# Patient Record
Sex: Female | Born: 1955 | Race: White | Hispanic: Yes | Marital: Married | Smoking: Never smoker
Health system: Southern US, Community
[De-identification: ages and names within clinical notes are randomized; demographics above are authoritative.]

## PROBLEM LIST (undated history)

## (undated) DIAGNOSIS — C221 Intrahepatic bile duct carcinoma: Secondary | ICD-10-CM

## (undated) DIAGNOSIS — M1990 Unspecified osteoarthritis, unspecified site: Secondary | ICD-10-CM

## (undated) DIAGNOSIS — I10 Essential (primary) hypertension: Secondary | ICD-10-CM

## (undated) DIAGNOSIS — C7889 Secondary malignant neoplasm of other digestive organs: Secondary | ICD-10-CM

## (undated) DIAGNOSIS — K635 Polyp of colon: Secondary | ICD-10-CM

## (undated) DIAGNOSIS — D490 Neoplasm of unspecified behavior of digestive system: Secondary | ICD-10-CM

## (undated) HISTORY — DX: Unspecified osteoarthritis, unspecified site: M19.90

## (undated) HISTORY — DX: Polyp of colon: K63.5

---

## 1999-01-27 ENCOUNTER — Encounter: Admission: RE | Admit: 1999-01-27 | Discharge: 1999-01-27 | Payer: Self-pay | Admitting: Obstetrics & Gynecology

## 1999-02-12 ENCOUNTER — Ambulatory Visit (HOSPITAL_COMMUNITY): Admission: RE | Admit: 1999-02-12 | Discharge: 1999-02-12 | Payer: Self-pay | Admitting: Obstetrics & Gynecology

## 1999-02-12 ENCOUNTER — Encounter: Payer: Self-pay | Admitting: Obstetrics & Gynecology

## 2000-07-11 ENCOUNTER — Encounter: Admission: RE | Admit: 2000-07-11 | Discharge: 2000-07-11 | Payer: Self-pay | Admitting: Family Medicine

## 2000-07-22 ENCOUNTER — Emergency Department (HOSPITAL_COMMUNITY): Admission: EM | Admit: 2000-07-22 | Discharge: 2000-07-22 | Payer: Self-pay | Admitting: Emergency Medicine

## 2000-07-22 ENCOUNTER — Encounter: Payer: Self-pay | Admitting: Emergency Medicine

## 2000-08-23 ENCOUNTER — Encounter: Admission: RE | Admit: 2000-08-23 | Discharge: 2000-08-23 | Payer: Self-pay | Admitting: Family Medicine

## 2000-08-23 ENCOUNTER — Encounter: Payer: Self-pay | Admitting: Family Medicine

## 2000-09-01 ENCOUNTER — Encounter: Admission: RE | Admit: 2000-09-01 | Discharge: 2000-10-05 | Payer: Self-pay | Admitting: Occupational Medicine

## 2001-04-03 ENCOUNTER — Encounter: Admission: RE | Admit: 2001-04-03 | Discharge: 2001-04-03 | Payer: Self-pay | Admitting: Family Medicine

## 2001-04-03 ENCOUNTER — Encounter: Payer: Self-pay | Admitting: Family Medicine

## 2001-09-22 ENCOUNTER — Encounter: Admission: RE | Admit: 2001-09-22 | Discharge: 2001-09-22 | Payer: Self-pay | Admitting: Family Medicine

## 2001-09-22 ENCOUNTER — Encounter: Payer: Self-pay | Admitting: Family Medicine

## 2002-09-28 ENCOUNTER — Encounter: Admission: RE | Admit: 2002-09-28 | Discharge: 2002-09-28 | Payer: Self-pay | Admitting: Family Medicine

## 2002-09-28 ENCOUNTER — Encounter: Payer: Self-pay | Admitting: Family Medicine

## 2002-10-05 ENCOUNTER — Encounter: Admission: RE | Admit: 2002-10-05 | Discharge: 2002-10-05 | Payer: Self-pay | Admitting: Family Medicine

## 2003-07-17 ENCOUNTER — Encounter: Admission: RE | Admit: 2003-07-17 | Discharge: 2003-07-17 | Payer: Self-pay | Admitting: Family Medicine

## 2004-04-30 ENCOUNTER — Encounter: Admission: RE | Admit: 2004-04-30 | Discharge: 2004-04-30 | Payer: Self-pay | Admitting: Family Medicine

## 2004-05-04 ENCOUNTER — Encounter: Admission: RE | Admit: 2004-05-04 | Discharge: 2004-05-04 | Payer: Self-pay | Admitting: Sports Medicine

## 2004-07-29 ENCOUNTER — Encounter (INDEPENDENT_AMBULATORY_CARE_PROVIDER_SITE_OTHER): Payer: Self-pay | Admitting: Family Medicine

## 2004-07-29 ENCOUNTER — Ambulatory Visit: Payer: Self-pay | Admitting: Family Medicine

## 2004-08-06 ENCOUNTER — Encounter: Admission: RE | Admit: 2004-08-06 | Discharge: 2004-08-06 | Payer: Self-pay | Admitting: Sports Medicine

## 2005-02-10 ENCOUNTER — Ambulatory Visit: Payer: Self-pay | Admitting: Family Medicine

## 2005-03-01 ENCOUNTER — Ambulatory Visit: Payer: Self-pay | Admitting: Sports Medicine

## 2005-08-10 ENCOUNTER — Encounter: Admission: RE | Admit: 2005-08-10 | Discharge: 2005-08-10 | Payer: Self-pay | Admitting: Sports Medicine

## 2006-02-16 ENCOUNTER — Ambulatory Visit: Payer: Self-pay | Admitting: Family Medicine

## 2006-02-16 ENCOUNTER — Encounter (INDEPENDENT_AMBULATORY_CARE_PROVIDER_SITE_OTHER): Payer: Self-pay | Admitting: Family Medicine

## 2006-02-16 ENCOUNTER — Other Ambulatory Visit: Admission: RE | Admit: 2006-02-16 | Discharge: 2006-02-16 | Payer: Self-pay | Admitting: Family Medicine

## 2006-02-18 ENCOUNTER — Encounter: Admission: RE | Admit: 2006-02-18 | Discharge: 2006-02-18 | Payer: Self-pay | Admitting: Sports Medicine

## 2006-02-26 ENCOUNTER — Encounter (INDEPENDENT_AMBULATORY_CARE_PROVIDER_SITE_OTHER): Payer: Self-pay | Admitting: *Deleted

## 2006-02-26 LAB — CONVERTED CEMR LAB

## 2006-04-20 ENCOUNTER — Emergency Department (HOSPITAL_COMMUNITY): Admission: EM | Admit: 2006-04-20 | Discharge: 2006-04-20 | Payer: Self-pay | Admitting: Emergency Medicine

## 2006-08-17 ENCOUNTER — Ambulatory Visit: Payer: Self-pay | Admitting: Family Medicine

## 2006-08-23 ENCOUNTER — Encounter: Admission: RE | Admit: 2006-08-23 | Discharge: 2006-08-23 | Payer: Self-pay | Admitting: Sports Medicine

## 2006-08-30 ENCOUNTER — Encounter: Admission: RE | Admit: 2006-08-30 | Discharge: 2006-08-30 | Payer: Self-pay | Admitting: Sports Medicine

## 2006-10-24 ENCOUNTER — Ambulatory Visit: Payer: Self-pay | Admitting: Sports Medicine

## 2006-11-16 ENCOUNTER — Ambulatory Visit: Payer: Self-pay | Admitting: Obstetrics and Gynecology

## 2006-11-17 ENCOUNTER — Ambulatory Visit (HOSPITAL_COMMUNITY): Admission: RE | Admit: 2006-11-17 | Discharge: 2006-11-17 | Payer: Self-pay | Admitting: Sports Medicine

## 2006-12-20 ENCOUNTER — Ambulatory Visit: Admission: RE | Admit: 2006-12-20 | Discharge: 2006-12-20 | Payer: Self-pay | Admitting: Gynecologic Oncology

## 2007-01-19 DIAGNOSIS — M1990 Unspecified osteoarthritis, unspecified site: Secondary | ICD-10-CM | POA: Insufficient documentation

## 2007-01-19 DIAGNOSIS — M7710 Lateral epicondylitis, unspecified elbow: Secondary | ICD-10-CM | POA: Insufficient documentation

## 2007-01-20 ENCOUNTER — Encounter (INDEPENDENT_AMBULATORY_CARE_PROVIDER_SITE_OTHER): Payer: Self-pay | Admitting: *Deleted

## 2007-01-24 ENCOUNTER — Inpatient Hospital Stay (HOSPITAL_COMMUNITY): Admission: RE | Admit: 2007-01-24 | Discharge: 2007-01-26 | Payer: Self-pay | Admitting: Obstetrics & Gynecology

## 2007-01-24 ENCOUNTER — Encounter (INDEPENDENT_AMBULATORY_CARE_PROVIDER_SITE_OTHER): Payer: Self-pay | Admitting: Specialist

## 2007-01-24 HISTORY — PX: ABDOMINAL HYSTERECTOMY: SHX81

## 2007-03-08 ENCOUNTER — Ambulatory Visit: Admission: RE | Admit: 2007-03-08 | Discharge: 2007-03-08 | Payer: Self-pay | Admitting: Gynecologic Oncology

## 2007-03-08 ENCOUNTER — Encounter (INDEPENDENT_AMBULATORY_CARE_PROVIDER_SITE_OTHER): Payer: Self-pay | Admitting: Family Medicine

## 2007-09-13 ENCOUNTER — Encounter: Admission: RE | Admit: 2007-09-13 | Discharge: 2007-09-13 | Payer: Self-pay | Admitting: Sports Medicine

## 2007-09-26 ENCOUNTER — Ambulatory Visit: Payer: Self-pay | Admitting: Family Medicine

## 2007-09-26 DIAGNOSIS — R030 Elevated blood-pressure reading, without diagnosis of hypertension: Secondary | ICD-10-CM | POA: Insufficient documentation

## 2007-09-28 ENCOUNTER — Ambulatory Visit: Payer: Self-pay | Admitting: Family Medicine

## 2007-09-28 ENCOUNTER — Encounter: Payer: Self-pay | Admitting: Family Medicine

## 2007-09-28 LAB — CONVERTED CEMR LAB
ALT: 10 units/L (ref 0–35)
Albumin: 4.6 g/dL (ref 3.5–5.2)
Alkaline Phosphatase: 72 units/L (ref 39–117)
CO2: 27 meq/L (ref 19–32)
Cholesterol: 233 mg/dL — ABNORMAL HIGH (ref 0–200)
Glucose, Bld: 95 mg/dL (ref 70–99)
LDL Cholesterol: 163 mg/dL — ABNORMAL HIGH (ref 0–99)
Platelets: 219 10*3/uL (ref 150–400)
Potassium: 4.4 meq/L (ref 3.5–5.3)
RBC: 4.39 M/uL (ref 3.87–5.11)
Sodium: 141 meq/L (ref 135–145)
Total Protein: 7.4 g/dL (ref 6.0–8.3)
Triglycerides: 99 mg/dL (ref <150)
WBC: 4.7 10*3/uL (ref 4.0–10.5)

## 2007-10-12 ENCOUNTER — Encounter: Payer: Self-pay | Admitting: Family Medicine

## 2007-12-08 ENCOUNTER — Ambulatory Visit: Payer: Self-pay | Admitting: Family Medicine

## 2007-12-08 DIAGNOSIS — I10 Essential (primary) hypertension: Secondary | ICD-10-CM

## 2008-08-27 ENCOUNTER — Ambulatory Visit: Payer: Self-pay | Admitting: Family Medicine

## 2008-08-27 ENCOUNTER — Encounter (INDEPENDENT_AMBULATORY_CARE_PROVIDER_SITE_OTHER): Payer: Self-pay | Admitting: Family Medicine

## 2008-08-27 DIAGNOSIS — E7800 Pure hypercholesterolemia, unspecified: Secondary | ICD-10-CM

## 2008-08-28 ENCOUNTER — Encounter (INDEPENDENT_AMBULATORY_CARE_PROVIDER_SITE_OTHER): Payer: Self-pay | Admitting: Family Medicine

## 2008-08-28 LAB — CONVERTED CEMR LAB
AST: 15 units/L (ref 0–37)
Albumin: 4.8 g/dL (ref 3.5–5.2)
Alkaline Phosphatase: 76 units/L (ref 39–117)
BUN: 13 mg/dL (ref 6–23)
Calcium: 9.6 mg/dL (ref 8.4–10.5)
Chloride: 103 meq/L (ref 96–112)
Potassium: 3.7 meq/L (ref 3.5–5.3)
Sodium: 141 meq/L (ref 135–145)
Total Protein: 7.5 g/dL (ref 6.0–8.3)

## 2008-09-13 ENCOUNTER — Encounter: Admission: RE | Admit: 2008-09-13 | Discharge: 2008-09-13 | Payer: Self-pay | Admitting: Family Medicine

## 2008-10-25 ENCOUNTER — Encounter (INDEPENDENT_AMBULATORY_CARE_PROVIDER_SITE_OTHER): Payer: Self-pay | Admitting: Family Medicine

## 2008-10-25 ENCOUNTER — Encounter: Payer: Self-pay | Admitting: Family Medicine

## 2008-10-29 ENCOUNTER — Encounter: Payer: Self-pay | Admitting: Family Medicine

## 2008-11-14 DIAGNOSIS — K635 Polyp of colon: Secondary | ICD-10-CM

## 2008-11-14 HISTORY — DX: Polyp of colon: K63.5

## 2009-09-19 ENCOUNTER — Encounter: Admission: RE | Admit: 2009-09-19 | Discharge: 2009-09-19 | Payer: Self-pay | Admitting: *Deleted

## 2010-06-24 ENCOUNTER — Ambulatory Visit: Payer: Self-pay | Admitting: Family Medicine

## 2010-06-24 ENCOUNTER — Encounter: Payer: Self-pay | Admitting: Family Medicine

## 2010-06-24 DIAGNOSIS — I8390 Asymptomatic varicose veins of unspecified lower extremity: Secondary | ICD-10-CM

## 2010-06-26 LAB — CONVERTED CEMR LAB
AST: 13 units/L (ref 0–37)
Albumin: 4.7 g/dL (ref 3.5–5.2)
Alkaline Phosphatase: 59 units/L (ref 39–117)
BUN: 11 mg/dL (ref 6–23)
Creatinine, Ser: 0.54 mg/dL (ref 0.40–1.20)
Glucose, Bld: 99 mg/dL (ref 70–99)
HDL: 49 mg/dL (ref >39)
Hepatitis B-Post: 0 milliintl units/mL
LDL Cholesterol: 133 mg/dL — ABNORMAL HIGH (ref 0–99)
Potassium: 3.7 meq/L (ref 3.5–5.3)
Total Bilirubin: 0.5 mg/dL (ref 0.3–1.2)
Total CHOL/HDL Ratio: 4.1
Triglycerides: 107 mg/dL (ref <150)
VLDL: 21 mg/dL (ref 0–40)

## 2010-07-17 ENCOUNTER — Telehealth: Payer: Self-pay | Admitting: Family Medicine

## 2010-07-20 ENCOUNTER — Encounter: Payer: Self-pay | Admitting: Family Medicine

## 2010-08-11 ENCOUNTER — Ambulatory Visit: Payer: Self-pay | Admitting: Family Medicine

## 2010-09-07 ENCOUNTER — Ambulatory Visit: Payer: Self-pay | Admitting: Vascular Surgery

## 2010-09-10 ENCOUNTER — Ambulatory Visit: Payer: Self-pay | Admitting: Family Medicine

## 2010-09-17 ENCOUNTER — Ambulatory Visit: Payer: Self-pay | Admitting: Vascular Surgery

## 2010-09-25 ENCOUNTER — Encounter: Admission: RE | Admit: 2010-09-25 | Discharge: 2010-09-25 | Payer: Self-pay | Admitting: Family Medicine

## 2010-12-13 ENCOUNTER — Encounter: Payer: Self-pay | Admitting: Obstetrics and Gynecology

## 2010-12-13 ENCOUNTER — Encounter: Payer: Self-pay | Admitting: *Deleted

## 2010-12-22 NOTE — Assessment & Plan Note (Signed)
Summary: Tammy [name]   Vital Signs:  Patient profile:   55 year old female Height:      60 inches Weight:      125.8 pounds BMI:     24.66 Temp:     98.7 degrees F Pulse rate:   96 / minute BP sitting:   150 / 87  Vitals Entered By: Golden Circle RN (August 11, 2010 8:32 AM)  Primary Care Aireana Ryland:  Ellery Plunk MD  CC:  f/u labs, HTN, and vaccines.  History of Present Illness: HTN- [facility] always 120s/70s feels nervous at the MD and thinks pressure goes up.   eating well. lost some weight.  walking daily.  vaccines- here today to start hep B and hep A series.    Habits & Providers  Alcohol-Tobacco-Diet     Alcohol drinks/day: 0     Tobacco Status: never  Exercise-Depression-Behavior     Seat Belt Use: always  Current Medications (verified): 1)  None  Allergies (verified): No Known Drug Allergies  Review of Systems  The patient denies anorexia, fever, vision loss, and chest pain.    Physical Exam  General:  VS reviewed.  BP rechecked and was 142/90.  well-developed, well-nourished, appropriate dress, and normal appearance.   Lungs:  Normal respiratory effort, chest expands symmetrically. Lungs are clear to auscultation, no crackles or wheezes. Heart:  Normal rate and regular rhythm. S1 and S2 normal without gallop, murmur, click, rub or other extra sounds. Extremities:  no edema   Impression & Recommendations:  Problem # 1:  ELEVATED BLOOD PRESSURE (ICD-796.2) Assessment Unchanged bp continues to be elevated.  repeat bp borderline HTN.  continue to monitor, [facility]   no meds.  Problem # 2:  HEPATITIS B IMMUNITY (ICD-V05.8) Assessment: Improved start vaccination series today. Orders: [facility]- Est Level  3 (76160)  Other Orders: Hepatitis A Vaccine (Adult Dose) (73710) Admin 1st Vaccine (62694) Hepatitis B Vaccine >27yrs (85462) Admin of Any Addtl Vaccine (70350)  Patient Instructions: 1)   Fue agradable ver que hoy en da. 2)   por favor haga una cita de enfermera en un mes para la vacuna. Despus de eso, ser de 6 meses ms hasta la prxima vacuna. 3)   Por favor, vuelva a verme en 6 meses o antes si es necesario 4)  nurse clinic 1 month for vaccine   Immunizations Administered:  Hepatitis A Vaccine # 1:    Vaccine Type: HepA    Site: left deltoid    Mfr: GlaxoSmithKline    Dose: 0.1 ml    Route: IM    Given by: Jone Baseman CMA    Exp. Date: 07/17/2012    Lot #: [id number]    VIS given: 02/09/05 version given August 11, 2010.  Hepatitis B Vaccine # 1:    Vaccine Type: HepB Adult    Site: right deltoid    Mfr: GlaxoSmithKline    Dose: 1.0 ml    Route: IM    Given by: Jone Baseman CMA    Exp. Date: 08/14/2012    Lot #: [id number]    VIS given: 06/08/06 version given August 11, 2010.

## 2010-12-22 NOTE — Letter (Signed)
Summary: [facility]  [facility]   Imported By: Clydell Hakim 06/25/2010 12:02:55  _____________________________________________________________________  External Attachment:    Type:   Image     Comment:   External Document

## 2010-12-22 NOTE — Letter (Signed)
Summary: Generic Letter  [facility]  [address]   [place], [place] [address]   Phone: [phone]  Fax: [phone]    07/20/2010  [facility] [address] [place], [place]  [address]  [place]. Cephus Richer, Estoy escribiendo con los [place] de sus anlisis de [place]. Su nivel de colesterol era slo ligeramente elevada. No creo que necesita un medicamento de Proofreader. Seguir trabajando en su dieta y ejercicio.  Su anlisis de [employer] no estn vacunados contra la hepatitis. Cuando vuelva a la clnica, que comenzar a las vacunas.  Por favor, haga una cita para volver pronto para volver a comprobar su presin arterial y obtener sus vacunas.  Atentamente,    Ellery Plunk MD  Appended Document: Generic Letter mailed

## 2010-12-22 NOTE — Consult Note (Signed)
Summary: [facility]  [facility]   Imported By: Bradly Bienenstock 06/24/2010 13:07:59  _____________________________________________________________________  External Attachment:    Type:   Image     Comment:   External Document

## 2010-12-22 NOTE — Progress Notes (Signed)
Summary: phn msg  Phone Note Call from Patient Call back at [phone]   Caller: Patient Summary of Call: pls call back with interpreter- spanish Initial call taken by: De Nurse,  July 17, 2010 8:54 AM  Follow-up for Phone Call        message given to Marines to call patient with information about [facility] appointment. Follow-up by: Theresia Lo RN,  July 20, 2010 8:33 AM  Additional Follow-up for Phone Call Additional follow up Details #1::        Marines gave message to patient about appointment with [facility] . now patient states she is waiting on a letter from MD about lab work . will send  message to MD to please advise. Additional Follow-up by: Theresia Lo RN,  July 20, 2010 8:38 AM    Additional Follow-up for Phone Call Additional follow up Details #2::    pt was to return to clinic in one month to get results.  She stated that she wanted them at the appt and not in a letter.  I will send a letter now but she should also make a follow up appt. Follow-up by: Ellery Plunk MD,  July 20, 2010 10:13 AM  letter mailed and will ask Marines to call patient to schedule appointment. Theresia Lo RN  July 20, 2010 11:32 AM  Appended Document: phn msg appointment scheduled for 08/11/2010 with Dr.Spiegel.

## 2010-12-22 NOTE — Assessment & Plan Note (Signed)
Summary: 2ND HEP SHOT/FLU SHOT/KH  Nurse Visit   Vital Signs:  Patient profile:   55 year old female Temp:     98.5 degrees F  Vitals Entered By: Theresia Lo RN (September 10, 2010 3:57 PM)  Allergies: No Known Drug Allergies  Immunizations Administered:  Influenza Vaccine # 1:    Vaccine Type: Fluvax 3+    Site: left deltoid    Mfr: GlaxoSmithKline    Dose: 0.5 ml    Route: IM    Given by: Theresia Lo RN    Exp. Date: 05/19/2011    Lot #: [id number]    VIS given: 06/16/10 version given September 10, 2010.  Hepatitis B Vaccine # 2:    Vaccine Type: HepB Adult    Site: right deltoid    Mfr: GlaxoSmithKline    Dose: 1.0 ml    Route: IM    Given by: Theresia Lo RN    Exp. Date: 08/14/2012    Lot #: [id number]    VIS given: 06/08/06 version given September 10, 2010.  Flu Vaccine Consent Questions:    Do you have a history of severe allergic reactions to this vaccine? no    Any prior history of allergic reactions to egg and/or gelatin? no    Do you have a sensitivity to the preservative Thimersol? no    Do you have a past history of Guillan-Barre Syndrome? no    Do you currently have an acute febrile illness? no    Have you ever had a severe reaction to latex? no    Vaccine information given and explained to patient? yes    Are you currently pregnant? no  Orders Added: 1)  Flu Vaccine 98yrs + [90658] 2)  Admin 1st Vaccine [90471] 3)  Hepatitis B Vaccine >43yrs [90746] 4)  Admin of Any Addtl Vaccine [90472]   Vital Signs:  Patient profile:   55 year old female Temp:     98.5 degrees F  Vitals Entered By: Theresia Lo RN (September 10, 2010 3:57 PM)

## 2010-12-22 NOTE — Assessment & Plan Note (Signed)
Summary: cpe/kh  [facility]  Vital Signs:  Patient profile:   55 year old female Height:      59.75 inches Weight:      129.4 pounds BMI:     25.58 Temp:     98.6 degrees F Pulse rate:   82 / minute BP sitting:   140 / 99  Vitals Entered By: Golden Circle RN (June 24, 2010 8:35 AM)  Primary Care Renan Danese:  Ellery Plunk MD  CC:  CPE.  History of Present Illness: reviewed preventative health care: mammo due 10/11, colonoscopy due 1-3 years  HTN- [facility], usually 120s/70s.  nervous at doctor's office  varicose veins- pt complaining of enlarged veins on legs for greater than a year, thinks they are due to her standing a lot at work.  does not hurt but wants referral to vein specialist for appearance.  HL- walking a lot, changed diet, less fat, lost weight,   ? vaccinated against Hep-immigration papers filed 4-5 years ago.  unclear if had Hep A or B. would like to be vacc if necessary.  Has friend at work with recent infection  Habits & Providers  Alcohol-Tobacco-Diet     Alcohol drinks/day: 0     Tobacco Status: never  Exercise-Depression-Behavior     Does Patient Exercise: yes     Exercise Counseling: not indicated; exercise is adequate     Type of exercise: walking     Drug Use: never     Seat Belt Use: always  Current Medications (verified): 1)  None  Allergies (verified): No Known Drug Allergies  Social History: Lives with husband, Jacqlyn Larsen and daughter, Sue Lush, in [place]. No tob, no EtOH, no drugs. Works in [employer] center at [employer] and [place]. 13 years here. Walks 45 min almost every day. improved her diet to have less fat.Does Patient Exercise:  yes Drug Use:  never Seat Belt Use:  always  Review of Systems  The patient denies anorexia, fever, chest pain, syncope, and abdominal pain.    Physical Exam  General:  VS reviewed, BP repeated and was lower 145/99alert, well-developed, and well-nourished.   Head:  normocephalic  and atraumatic.   Eyes:  vision grossly intact, pupils equal, pupils round, pupils reactive to light, and no injection.   Ears:  R ear normal and L ear normal.   Nose:  no external deformity and no external erythema.   Mouth:  good dentition, no dental plaque, and pharynx pink and moist.   Neck:  No deformities, masses, or tenderness noted. Chest Wall:  No deformities, masses, or tenderness noted. Lungs:  Normal respiratory effort, chest expands symmetrically. Lungs are clear to auscultation, no crackles or wheezes. Heart:  Normal rate and regular rhythm. S1 and S2 normal without gallop, murmur, click, rub or other extra sounds. Abdomen:  Bowel sounds positive,abdomen soft and non-tender without masses, organomegaly or hernias noted. Pulses:  R and L carotid,radial,femoral,dorsalis pedis and posterior tibial pulses are full and equal bilaterally Extremities:  No clubbing, cyanosis, edema, or deformity noted with normal full range of motion of all joints.   Neurologic:  No cranial nerve deficits noted. Station and gait are normal. Sensory, motor and coordinative functions appear intact. Skin:  varicose veins present in L leg and R knee Cervical Nodes:  No lymphadenopathy noted Psych:  Cognition and judgment appear intact. Alert and cooperative with normal attention span and concentration. No apparent delusions, illusions, hallucinations   Impression & Recommendations:  Problem #  1:  WELL WOMAN (ICD-V70.0) Assessment Unchanged reviewed preventative care, all normal.  will get path results from last colonoscopy to determine if needs repeat in 1 or 3 years.  no pap, hysterectomy for benign reasons. Orders: [facility] - Est  40-64 yrs ([address])  Problem # 2:  ASYMPTOMATIC VARICOSE VEINS (ICD-454.9) Assessment: New referral to vascular clinic for eval Orders: Vascular Clinic (Vascular)  Problem # 3:  HYPERCHOLESTEROLEMIA (ICD-272.0) Assessment: Unchanged recheck lipids today.  will start med if  still elevated.  RTC in 1 month Orders: Lipid-[facility] ([id number])  Problem # 4:  ESSENTIAL HYPERTENSION, BENIGN (ICD-401.1) Assessment: Unchanged still mildly elevated in clinic.  will recheck in 1 month.  if still elevated will start med Orders: Comp Met-[facility] [phone])  Other Orders: Miscellaneous Lab Charge-[facility] [phone])  Appended Document: cpe/kh path report  showed tubular adenoma, repeat colonoscopy 2014

## 2011-02-09 ENCOUNTER — Ambulatory Visit (INDEPENDENT_AMBULATORY_CARE_PROVIDER_SITE_OTHER): Payer: BC Managed Care – PPO | Admitting: *Deleted

## 2011-02-09 DIAGNOSIS — Z23 Encounter for immunization: Secondary | ICD-10-CM

## 2011-02-09 MED ORDER — HEPATITIS A VACCINE 1440 EL U/ML IM SUSP
1.0000 mL | Freq: Once | INTRAMUSCULAR | Status: DC
  Administered 2011-02-09: 1440 [IU] via INTRAMUSCULAR

## 2011-04-06 NOTE — Consult Note (Signed)
NEW PATIENT CONSULTATION   Tammy Tammy [name], Tammy Tammy [name]  DOB:  06-01-1956                                       09/07/2010  CHART#:[id number]   Patient is a 55 year old healthy female from [place] who has noticed  spider veins in both legs over the last 3 to 4 years which cause  significant itching.  She has no swelling, history of DVT,  thrombophlebitis, bleeding, stasis ulcers, or other complications.  She  does not wear elastic compression stockings nor elevate the legs nor  take pain medication but is concerned about these new veins which she is  noticing.   CHRONIC MEDICAL PROBLEMS:  1. Borderline hypertension.  2. Borderline hyperlipidemia.  3. Negative coronary artery disease, diabetes, COPD, or stroke.   SOCIAL HISTORY:  She is married, has 1 child.  Does not use tobacco or  alcohol.   She does not know her family history.  She has been separated from her  parents since age 4 and has lived in the [place] for 15 years.   REVIEW OF SYSTEMS:  Negative for chest pain, dyspnea on exertion, PND,  orthopnea, pulmonary problems.  Does have arthritis.  All other systems  are negative.   PHYSICAL EXAMINATION:  Blood pressure 151/90, heart rate 79,  respirations 24.  Generally, she is a well-developed, well-nourished  female in no apparent distress.  Alert and oriented x3.  HEENT:  Normal.  EOMs intact.  Lungs:  Clear to auscultation.  No rhonchi or wheezing.  Cardiovascular:  Regular rhythm.  No murmurs.  Carotid pulses are 3+.  No audible bruits.  Abdomen:  Soft, nontender with no masses.  Musculoskeletal:  Free of major deformities.  Neurologic:  Normal.  Skin  is free of rashes.  Lower extremity exam reveals 3+ femoral, popliteal,  and dorsalis pedis pulses bilaterally.  She does have some scattered  spider veins in the lateral thighs bilaterally and posteriorly in the  distal thigh and calf.  There is no hyperpigmentation, ulceration,  bulging varicosities, or severe  venous insufficiency by physical exam.   I did a bedside SonoSite ultrasound exam today which revealed no  evidence of gross reflux at the saphenofemoral junctions bilaterally.   I have recommended primary sclerotherapy if she would like treatment,  and she will discuss this further with Marisue Ivan to decide if she would like  to proceed.     Tammy Tammy [name], M.Tammy [name].  Electronically Signed   JDL/MEDQ  Tammy [name]:  09/07/2010  T:  09/08/2010  Job:  [id number]   cc:   Ellery Plunk, MD

## 2011-04-09 NOTE — H&P (Signed)
NAME:  Tammy [name], Tammy [name]              ACCOUNT NO.:  [id number]   MEDICAL RECORD NO.:  [id number]         PATIENT TYPE:  LINP   LOCATION:                               FACILITY[facility]   PHYSICIAN:  Paola A. Duard Brady, MD    DATE OF BIRTH:  26-Dec-1955   DATE OF ADMISSION:  01/24/2007  DATE OF DISCHARGE:                              HISTORY & PHYSICAL   This will serve as an H&P for this patient's surgery, which is scheduled  for January 24, 2007.   This is a 55 year old who was referred by Dr. Okey Dupre secondary to  abdominal pelvic pain, ovarian mass with an elevated CA-125.  The  patient has a six month history of intermittent right lower quadrant  pain which is exacerbated with her menses.  Occasionally, she will have  some intramenstrual discomfort and dyspareunia.  She also has some  occasional stress urinary incontinence and urgency.  She had an  ultrasound in May, 2007 and October, 2007 which revealed a hypoechoic  right ovarian mass, which was stable in size.  Her uterine fibroids and  a left ovarian follicle.  She underwent a CT scan of the abdomen and  pelvis in December, 2007 that revealed a complex right ovarian cystic  lesion measuring 3.5 x 4 x 3.9 cm with multiple small internal  calcifications.  There was no fat seen to confirm the diagnosis of a  dermoid.  A CA-125 in December was 113.  It was repeated January 29th,  and it was 100.   PAST MEDICAL HISTORY:  Status post NSVD.   MEDICATIONS:  Vitamins, Tylenol, and Advil.   ALLERGIES:  None.   SOCIAL HISTORY:  She denies any use of alcohol or tobacco.   FAMILY HISTORY:  Noncontributory.   PHYSICAL EXAMINATION:  VITAL SIGNS:  Height 5 feet 2.  Weight 141  pounds.  Blood pressure 122/78, pulse 86, respirations 18.  GENERAL:  A well-developed and well-nourished female in no acute  distress.  Dr. Calton Dach exam from January 29th:  LUNGS:  Clear to auscultation bilaterally.  CARDIOVASCULAR:  Regular rate and rhythm.  ABDOMEN:   Soft and nontender with no palpable mass or  hepatosplenomegaly.  BIMANUAL:  Uterus that was upper-sized with no significant masses.  She  had significant tenderness in the bilateral adnexa.   ASSESSMENT:  A 55 year old with a persistent right adnexal mass,  fibroids, and an elevated CA-125.  The patient wished to have surgery;  however, wishes to have conservative procedures.  She is tentatively  scheduled for a  TAH/BSO.  If this is a malignant process, we will proceed with  appropriate staging.  The risks of surgery including bleeding,  infection, injury to surrounding organs, thromboembolic disease,  cardiovascular disease in advance were discussed with the patient.  I  will meet her the morning of surgery.  She will have the appropriate  preop visit.      Paola A. Duard Brady, MD  Electronically Signed     PAG/MEDQ  D:  01/17/2007  T:  01/17/2007  Job:  [id number]   cc:   Javier Glazier. Okey Dupre,  M.D.   Telford Nab, R.N.  [address]  [place], [place] [address]

## 2011-04-09 NOTE — Consult Note (Signed)
NAME:  Tammy [name], Tammy [name]              ACCOUNT NO.:  [id number]   MEDICAL RECORD NO.:  [id number]          PATIENT TYPE:  OUT   LOCATION:  GYN                          FACILITY:  [facility]   PHYSICIAN:  John T. Kyla Balzarine, M.D.    DATE OF BIRTH:  Jun 14, 1956   DATE OF CONSULTATION:  12/20/2006  DATE OF DISCHARGE:                                 CONSULTATION   CHIEF COMPLAINT:  This 56 year old [place] woman is seen at the  request of Dr. Okey Dupre for recommendations regarding management of  abdominopelvic pain, small ovarian masses, and elevated CA-125 value.   HISTORY OF PRESENT ILLNESS:  The patient has a 71-month history of  intermittent right lower quadrant pain.  This will exacerbate at onset  of menses and generally feels better after menstrual flow has completed.  She does note occasional intermenstrual discomfort; and dyspareunia.  She states that on occasion the pain doubles her over.  Concurrently she  has noted urinary stress incontinence accompanied by considerable  urgency.  She has chronic constipation.  She denies early satiety or  bloating.  She is evaluated with an ultrasound in March 2007;  and,  again, in October 2007 with ultrasound revealing a hypoechoic right  ovarian mass which was stable.  Left ovarian follicle was noted; and she  has known uterine fibroids.  Subsequently a CT of abdomen and pelvis was  performed on December 27 which revealed normal upper abdominal CT scan.  In the pelvis, a complex right ovarian cystic lesion was seen; and this  measured 3.5 x 4.0 x 3.9 cm with multiple small internal calcifications.  Fat was not seen to confirm diagnosis of a dermoid.  In the left adnexa  there was also complex cystic lesion with one 6-mm calcification; and  this measured 2.6 x 3.0 x 2.1.  Other pelvic organs, abdominal viscera,  peritoneal surfaces, and lymph nodes were normal.  CA-125 value obtained  December 3 was 113.   PAST MEDICAL HISTORY:  Significant for no major  comorbidities.  She is  status post vaginal delivery approximately 16 years ago.  She and her  husband have not actively pursued contraception or infertility over that  time interval.  No major comorbidities or surgical procedures.   MEDICATIONS:  Vitamins, Tylenol, and Advil p.r.n.   ALLERGIES:  None known.   PERSONAL AND SOCIAL HISTORY:  The patient is married and denies tobacco,  ethanol, or drug use.   FAMILY HISTORY:  Largely unknown as she was raised by an aunt who died  when she was extremely young.   REVIEW OF SYSTEMS:  Other than noted above, comprehensive 10-point  review negative.   PHYSICAL EXAMINATION:  VITAL SIGNS:  Stable and afebrile as recorded in  clinic flow sheet.  GENERAL:  The patient is extremely anxious, alert, and oriented x3.  ENT:  Benign with clear oropharynx.  NECK:  Supple without goiter.  LUNG FIELDS:  Clear to percussion and auscultation.  HEART:  Sounds regular rate and rhythm without JVD.  LYMPH SURVEY:  Negative for pathologic lymphadenopathy.  BACK:  No back or CVA tenderness.  ABDOMEN:  Soft with no peritoneal signs.  There is voluntary guarding  and tenderness in the lower quadrants bilaterally, without mass, ascites  or hernia.  EXTREMITIES:  Have full strength and range of motion without edema.  PELVIC:  External genitalia and BUS are normal to inspection and  palpation.  The bladder and urethra are normal.  Evaluation of the  cervix reveals no cervical lesions and good cervical mobility with  minimal tenderness.  BIMANUAL AND RECTOVAGINAL EXAMINATIONS:  Disclose upper size uterus with  no palpable mass, but with significant tenderness bilateral adnexa.  No  cul-de-sac nodularity is noted.   ASSESSMENT:  Persistent right adnexal cyst, fibroids, and low-grade  elevation of CA-125.  Differential diagnosis would favor a benign cause  such as endometriosis. We discussed potential conservative measures with  the patient and her husband; and  they wish definitive surgery.  We will  schedule TAH-BSO in the near future.  A CA-125 will be repeated, today;  and her surgery moved up if this is significantly rising, particularly  if it is above the threshold of 200-250 international units.      John T. Kyla Balzarine, M.D.  Electronically Signed     JTS/MEDQ  D:  12/20/2006  T:  12/20/2006  Job:  [id number]   cc:   Dr. Dereck Ligas [facility]   [place], R.N.  [phone]  [place], [place] [address]

## 2011-04-09 NOTE — Discharge Summary (Signed)
NAME:  Tammy [name], Tammy [name]              ACCOUNT NO.:  [id number]   MEDICAL RECORD NO.:  [id number]          PATIENT TYPE:  INP   LOCATION:  1614                         FACILITY:  [facility]   PHYSICIAN:  Roseanna Rainbow, M.D.DATE OF BIRTH:  07/30/1956   DATE OF ADMISSION:  01/24/2007  DATE OF DISCHARGE:  01/26/2007                               DISCHARGE SUMMARY   CHIEF COMPLAINT:  The patient is a 55 year old with an ovarian mass and  an elevated CA-125 who presents for a TAH-BSO.  Please see the dictated  history and physical as per Dr. Rockney Ghee.   HOSPITAL COURSE:  The patient underwent a total abdominal hysterectomy,  bilateral salpingo-oophorectomy, ureterolysis and lysis of adhesions.  Again, please see the dictated operative summary for further details.  On postoperative day #1, her hemoglobin was 10.8.  Her diet was advanced  as tolerated.  Basic metabolic profile was normal.  The remainder of her  hospital course was uneventful.  She was discharged to home on  postoperative day #2 tolerating a regular diet.   DISCHARGE DIAGNOSIS:  Endometriosis.   PROCEDURES:  Total abdominal hysterectomy, lysis of adhesions and  bilateral ureterolysis.   CONDITION ON DISCHARGE:  Stable.   DISCHARGE DIET:  Regular.   ACTIVITY:  Progressive activity, pelvic rest.   DISCHARGE MEDICATIONS:  Vicodin.   DISPOSITION:  The patient was to follow up in the GYN/Oncology office on  March 13 for staple removal.      Roseanna Rainbow, M.D.  Electronically Signed     LAJ/MEDQ  D:  02/22/2007  T:  02/22/2007  Job:  [id number]   cc:   Telford Nab, R.N.  [address]  [place], [place] [address]   Javier Glazier. Okey Dupre, M.D.

## 2011-04-09 NOTE — Consult Note (Signed)
NAME:  Tammy [name], Tammy [name]              ACCOUNT NO.:  [id number]   MEDICAL RECORD NO.:  [id number]          PATIENT TYPE:  OUT   LOCATION:  GYN                          FACILITY:  [facility]   PHYSICIAN:  John T. Kyla Balzarine, M.D.    DATE OF BIRTH:  [birth year]   DATE OF CONSULTATION:  03/08/2007  DATE OF DISCHARGE:                                 CONSULTATION   CHIEF COMPLAINT:  Postoperative follow-up after surgery for  endometriosis.   HISTORY OF PRESENT ILLNESS:  This patient was explored by Dr. Duard Brady and  Dr. Tamela Oddi on January 24, 2007 undergoing TAH/BSO.  Final pathology  and washings were compatible with extensive endometriosis with  endometriomas.  The patient has had slow return to function, although  she states currently normal bowel and bladder function, no leg swelling,  and minimal tenderness unless she strains.  She is still using  ibuprofen.  She is concerned about a lump at either end of the  incision which is more tender.  She denies fever, chills or back pain.  She denies leg swelling.   PAST MEDICAL HISTORY:  Significant for no major comorbidities.   MEDICATIONS:  Other than ibuprofen and multivitamins she is on no  medications.   ALLERGIES:  She denies allergies.   PHYSICAL EXAMINATION:  VITAL SIGNS:  Blood pressure 118/68, weight 135  pounds.  ABDOMEN:  Soft and benign.  The incision is healing well, and there is  no evidence of hernia or inflammation.  The two lumps that the patient  is palpating comprise the end of each of the fascial suture lines where  there is a little bit more induration.  EXTREMITIES:  Are without edema, cords or Homans' sign.  PELVIC:  Speculum exam reveals an intact cuff.  Bimanual and  rectovaginal examinations disclose absent uterus and cervix with the  expected postop induration and no mass.   ASSESSMENT:  Endometriosis post definitive surgery.   PLAN:  The patient is asymptomatic in regard to hot flashes.  She had  benign disease and  can be followed by Dr. Okey Dupre or the [facility].  We would be glad to see her back at any time if there  are any issues.  Because of continued pain, the patient is given a  release for an additional two weeks before returning to work as her work  does involve abdominal straining.      John T. Kyla Balzarine, M.D.  Electronically Signed     JTS/MEDQ  D:  03/08/2007  T:  03/08/2007  Job:  [id number]   cc:   Javier Glazier. Okey Dupre, M.D.   Telford Nab, R.N.  [address]  [place], [place] [address]   Roseanna Rainbow, M.D.  Fax: [phone]   [facility]

## 2011-04-09 NOTE — Group Therapy Note (Signed)
NAME:  Tammy [name], Tammy [name] NO.:  [id number]   MEDICAL RECORD NO.:  [id number]          PATIENT TYPE:  WOC   LOCATION:  [facility]                   FACILITY:  [facility]   PHYSICIAN:  Argentina Donovan, MD        DATE OF BIRTH:  [birth year]   DATE OF SERVICE:                                  CLINIC NOTE   The patient is a 55 year old Hispanic female, gravida 1, para 1-0-0-1,  who was seen in the family practice clinic in the beginning of 2007  complaining of upper right quadrant pain.  She had a Pap smear which was  normal at that time and was told everything was okay.  She went back  again in March, still complaining of the pain.  An ultrasound was done  which showed a right adnexal mass of undetermined significance with a  recommendation that a followup be done in 6 weeks.  The suggestion was  that there was a suspicion of fibroids with a dermoid cyst with small  fibroid tumors on the uterus.  The patient had a repeat ultrasound in  October, which showed that this mass had not changed and they did not  think any further followup was needed.  At the time also she had a CA-  125 drawn which was elevated at 113.  In addition the pain had  persisted.   PHYSICAL EXAMINATION:  ABDOMEN:  Soft, flat, nontender.  No masses or  organomegaly but the pain she points out to is in the far upper right  quadrant.  PELVIC:  External genitalia is normal.  BUS within normal limits.  Vagina is clean and well rugated.  Cervix is clean and parous.  The  uterus is slightly enlarged, retroverted, and levo deviated.  The adnexa  could not be well outlined.  Bimanual examination and the cul-de-sac  seemed free.   PLAN:  Get a CT of the abdomen to further rule out any other pathology  that might be obvious to also include the gallbladder in that study as  the pain seems to be in that particular area, although it does not seem  to be related to diet and is not disabling by any means is more annoying  than acute and then make a decision on whether this gynecologist or the  oncologist should handle this case as far as investigating the ovary.   IMPRESSION:  1. Upper right quadrant pain.  2. Pelvic mass, probably right ovarian.  3. Elevated CA-125.           ______________________________  Argentina Donovan, MD     PR/MEDQ  D:  11/16/2006  T:  11/16/2006  Job:  [id number]

## 2011-04-09 NOTE — Op Note (Signed)
NAME:  Tammy [name], Tammy [name]              ACCOUNT NO.:  [id number]   MEDICAL RECORD NO.:  [id number]          PATIENT TYPE:  INP   LOCATION:  0007                         FACILITY:  [facility]   PHYSICIAN:  Paola A. Duard Brady, MD    DATE OF BIRTH:  02/07/1956   DATE OF PROCEDURE:  01/24/2007  DATE OF DISCHARGE:                               OPERATIVE REPORT   PREOPERATIVE DIAGNOSIS:  Persistent right ovarian mass, elevated CA-125.   POSTOPERATIVE DIAGNOSIS:  Endometriosis.   PROCEDURE:  TAH-BSOx, lysis of adhesions for 30 minutes, bilateral  ureterolysis.   SURGEON:  Dr. Duard Brady and Dr. Tamela Oddi   ASSISTANT:  Aundria Rud   ANESTHESIA:  General.   ANESTHESIOLOGIST:  Dr. Rica Mast.   ESTIMATED BLOOD LOSS:  100 mL.   URINE OUTPUT:  300 mL.   IV FLUIDS:  2100 mL.   SPECIMENS:  To pathology.   COMPLICATIONS:  None.   Operative findings included a 5 cm endometrioma involving the right  ovary.  The bladder and sigmoid colon were densely adherent to the round  ligament.  Dense adhesions of the rectosigmoid colon to the uterus and  posterior cervix with obliteration of the cul-de-sac.   The patient was taken to the operating room, placed in the spine  position.  General anesthesia was then induced.  She was then placed in  the dorsal lithotomy position with all appropriate precautions.  The  abdomen was prepped in usual sterile fashion.  The perineum and vagina  were prepped in the usual sterile fashion.  A Foley catheter was  inserted into the bladder under sterile conditions.  The patient was  then draped in the usual fashion.  Time-out was performed to confirm the  procedure, patient, and allergies.   Vertical midline incision was made with the knife and carried down to  the underlying fascia using Bovie cautery.  The fascia was identified,  scored in the midline, and extended superiorly and inferiorly.  The  rectus bellies were dissected off the overlying fascia.  The peritoneum  was identified, tented, and entered sharply.  The peritoneal incision  was extended superiorly and inferiorly with visualization of the  underlying peritoneal cavity.  Washings were then obtained, and the  findings as above were noted.  The Bookwalter self-retaining retractor  was placed on the table with all appropriate precautions being taken.  The shortest blades were placed.  At this point of initial placement and  throughout the case, lateral blade placement was done to ensure that  there was no significant pressure on the psoas bellies.  A Kelly clamp  was then used to elevate the right side of the uterus.  The round  ligament on the patient's right side was transected with Bovie cautery,  and the anterior and posterior leafs of the broad ligament were opened  using Bovie cautery.  The ureter was identified on the right side, and a  window was made between the IP and the ureter.  The IP was clamped x2,  transected and suture ligated with 0 Vicryl.  The bladder flap  anteriorly was then created.  The uterine  artery was skeletonized on the  patient's right side, and the uterine arteries were clamped x2,  transected, suture ligated with 0 Vicryl.  At this point, we spent  approximately 30 minutes freeing the rectosigmoid colon from its  adhesive attachments to the posterior uterus and cervix as well as to  the left adnexa.  We also sharply dissected the bladder down from its  adhesive disease to the round ligament on the patient's left side.  Once  the adhesions had been freed up, the normal anatomy was restored.  The  round ligament on the patient's left side was transected with Bovie  cautery.  The anterior and posterior leaves of the broad ligament were  then opened.  The ureter on the left side was identified.  A window was  made between the IP and the ureter.  The IP was clamped x2, transected  and ligated with 0 Vicryl.  Adhesive disease of scar issue was noted to  be tenting the  ureter up toward the cervical isthmus junction.  Care was  taken to free these adhesions using sharp dissection.  Once we were  satisfied with the placement of the ureter, the inferior and lateral,  the uterine arteries on the patient's left side were clamped x2,  transected and suture ligated.  In order to improve visualization, a  supracervical hysterectomy was performed with the Bovie cautery.  The  cervix was then grasped with Kocher clamps.  This allowed [place] to  visualize the adhesive disease of the rectosigmoid colon to the  posterior cervix.  Using sharp meticulous dissection, the cul-de-sac  anatomy was restored.  We continued down the cardinal ligament using  successive clamps, suture ligation.  We came across the cervicovaginal  angles with curved mattresses.  The cervix was transected from the  vagina and handed off and sent to pathology.  The cervicovaginal angles  were closed with a figure-of-eight suture of 0 Vicryl.  The remainder of  the cuff was closed using 0-8 Vicryl sutures.  The pelvis was irrigated.  The proctoscope was used to insufflate the rectosigmoid colon with air.  There was noted to be no bubbles.  The irrigation was removed.  Areas  were noted to be hemostatic.  Seprafilm was then placed to ensure  additional attempt to minimize rectosigmoid adhesions to the vaginal  cuff.  The sutures were transected.  The packing was removed and the  Bookwalter was removed from the patient's abdomen.  Again, all pedicles  were noted to be hemostatic.   The [employer] laparotomy sponges were removed.  The fascia was closed  using a #1 PDS in a running mass closure.  The subcu tissues were  irrigated.  Hemostasis was obtained using pinpoint cautery.  The skin  was closed using skin clips.   The patient tolerated the procedure well.  There no immediate complications.  All instrument, needle and laparotomy counts were  correct x2.      Paola A. Duard Brady, MD  Electronically  Signed     PAG/MEDQ  D:  01/24/2007  T:  01/24/2007  Job:  [id number]   cc:   Javier Glazier. Okey Dupre, M.D.   Telford Nab, R.N.  [address]  [place], [place] [address]   Roseanna Rainbow, M.D.  Fax: [phone]

## 2011-06-04 ENCOUNTER — Encounter: Payer: BC Managed Care – PPO | Admitting: Family Medicine

## 2011-07-28 ENCOUNTER — Ambulatory Visit (INDEPENDENT_AMBULATORY_CARE_PROVIDER_SITE_OTHER): Payer: BC Managed Care – PPO | Admitting: Family Medicine

## 2011-07-28 ENCOUNTER — Encounter: Payer: Self-pay | Admitting: Family Medicine

## 2011-07-28 DIAGNOSIS — E7800 Pure hypercholesterolemia, unspecified: Secondary | ICD-10-CM

## 2011-07-28 DIAGNOSIS — I10 Essential (primary) hypertension: Secondary | ICD-10-CM

## 2011-07-28 LAB — COMPREHENSIVE METABOLIC PANEL
ALT: 12 U/L (ref 0–35)
AST: 14 U/L (ref 0–37)
Alkaline Phosphatase: 63 U/L (ref 39–117)
BUN: 12 mg/dL (ref 6–23)
Calcium: 9.7 mg/dL (ref 8.4–10.5)
Chloride: 104 mEq/L (ref 96–112)
Creat: 0.46 mg/dL — ABNORMAL LOW (ref 0.50–1.10)
Total Bilirubin: 0.5 mg/dL (ref 0.3–1.2)

## 2011-07-28 LAB — LIPID PANEL
HDL: 53 mg/dL (ref >39)
Total CHOL/HDL Ratio: 4.1 Ratio
VLDL: 28 mg/dL (ref 0–40)

## 2011-07-28 NOTE — Assessment & Plan Note (Signed)
Pt continues good diet and exercise.  Wants to have lipids rechecked.

## 2011-07-28 NOTE — Patient Instructions (Signed)
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    Voy a enviar una carta si todo esta bien  SI no, [place] va a llamarla

## 2011-07-28 NOTE — Assessment & Plan Note (Addendum)
Elevated in the office but pt checks at home and BPs are normal.  Will not start medication.  Consider ambulatory BP testing.  CMET today

## 2011-07-28 NOTE — Progress Notes (Signed)
  Subjective:     Tammy [name] is a 55 y.o. female and is here for a comprehensive physical exam. The patient reports problems - arthritis in hands and neck.  has not tried any medication.  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  History   Social History  . Marital Status: Married    Spouse Name: N/A    Number of Children: N/A  . Years of Education: N/A   Occupational History  . Not on file.   Social History Main Topics  . Smoking status: Never Smoker   . Smokeless tobacco: Never Used  . Alcohol Use: No  . Drug Use: No  . Sexually Active: Yes    Birth Control/ Protection: Surgical   Other Topics Concern  . Not on file   Social History Narrative  . No narrative on file   Health Maintenance  Topic Date Due  . Pap Smear  08/07/1975  . Colonoscopy  08/07/2007  . Influenza Vaccine  08/23/2011  . Tetanus/tdap  11/23/2011  . Mammogram  09/25/2012    The following portions of the patient's history were reviewed and updated as appropriate: allergies, current medications, past family history, past medical history, past social history, past surgical history and problem list.  Review of Systems Constitutional: negative for anorexia, night sweats and weight loss Cardiovascular: negative for chest pressure/discomfort Gastrointestinal: negative for constipation and diarrhea   Objective:    BP 158/84  Pulse 79  Temp(Src) 97.9 F (36.6 C) (Oral)  Wt 128 lb 9.6 oz (58.333 kg) General appearance: alert and cooperative Head: Normocephalic, without obvious abnormality, atraumatic Neck: no adenopathy, no carotid bruit, no JVD, supple, symmetrical, trachea midline, thyroid not enlarged, symmetric, no tenderness/mass/nodules and no tenderness to palpation, tender with turning her neck over spinus processes.  no point tenderness there.  negative spurlings Back: range of motion normal, symmetric, no curvature. ROM normal. No CVA tenderness. Lungs: clear to auscultation  bilaterally Heart: regular rate and rhythm, S1, S2 normal, no murmur, click, rub or gallop Abdomen: soft, non-tender; bowel sounds normal; no masses,  no organomegaly Extremities: extremities normal, atraumatic, no cyanosis or edema Skin: Skin color, texture, turgor normal. No rashes or lesions    Assessment:    Healthy female exam.  arthritis     Plan:  No pap today as non cervix Tylenol for arthritis pain, could get xray to eval

## 2011-07-29 ENCOUNTER — Encounter: Payer: Self-pay | Admitting: Family Medicine

## 2011-09-02 ENCOUNTER — Ambulatory Visit (INDEPENDENT_AMBULATORY_CARE_PROVIDER_SITE_OTHER): Payer: BC Managed Care – PPO | Admitting: *Deleted

## 2011-09-02 DIAGNOSIS — Z23 Encounter for immunization: Secondary | ICD-10-CM

## 2012-02-15 ENCOUNTER — Other Ambulatory Visit: Payer: Self-pay | Admitting: Family Medicine

## 2012-02-15 DIAGNOSIS — Z1231 Encounter for screening mammogram for malignant neoplasm of breast: Secondary | ICD-10-CM

## 2012-04-06 ENCOUNTER — Ambulatory Visit
Admission: RE | Admit: 2012-04-06 | Discharge: 2012-04-06 | Disposition: A | Discharge status: Home / Self Care | Payer: BC Managed Care – PPO | Source: Ambulatory Visit | Attending: Family Medicine | Admitting: Family Medicine

## 2012-04-06 DIAGNOSIS — Z1231 Encounter for screening mammogram for malignant neoplasm of breast: Secondary | ICD-10-CM

## 2012-09-12 ENCOUNTER — Encounter: Payer: Self-pay | Admitting: Family Medicine

## 2012-09-12 ENCOUNTER — Other Ambulatory Visit: Payer: Self-pay | Admitting: Family Medicine

## 2012-09-12 ENCOUNTER — Ambulatory Visit (INDEPENDENT_AMBULATORY_CARE_PROVIDER_SITE_OTHER): Payer: BC Managed Care – PPO | Admitting: Family Medicine

## 2012-09-12 VITALS — BP 158/98 | HR 96 | Temp 98.1°F | Ht 60.0 in | Wt 130.0 lb

## 2012-09-12 DIAGNOSIS — Z23 Encounter for immunization: Secondary | ICD-10-CM

## 2012-09-12 DIAGNOSIS — M1990 Unspecified osteoarthritis, unspecified site: Secondary | ICD-10-CM

## 2012-09-12 DIAGNOSIS — E7800 Pure hypercholesterolemia, unspecified: Secondary | ICD-10-CM

## 2012-09-12 DIAGNOSIS — I10 Essential (primary) hypertension: Secondary | ICD-10-CM

## 2012-09-12 DIAGNOSIS — Z0000 Encounter for general adult medical examination without abnormal findings: Secondary | ICD-10-CM

## 2012-09-12 LAB — COMPLETE METABOLIC PANEL WITH GFR
ALT: 26 U/L (ref 0–35)
AST: 22 U/L (ref 0–37)
Creat: 0.47 mg/dL — ABNORMAL LOW (ref 0.50–1.10)
Total Bilirubin: 0.6 mg/dL (ref 0.3–1.2)

## 2012-09-12 LAB — CBC
MCH: 30.2 pg (ref 26.0–34.0)
MCHC: 33.4 g/dL (ref 30.0–36.0)
MCV: 90.4 fL (ref 78.0–100.0)
Platelets: 207 10*3/uL (ref 150–400)
RDW: 13.1 % (ref 11.5–15.5)

## 2012-09-12 LAB — LIPID PANEL
Cholesterol: 253 mg/dL — ABNORMAL HIGH (ref 0–200)
HDL: 48 mg/dL (ref >39)
Total CHOL/HDL Ratio: 5.3 Ratio

## 2012-09-12 LAB — HIV ANTIBODY (ROUTINE TESTING W REFLEX): HIV: NONREACTIVE

## 2012-09-12 NOTE — Assessment & Plan Note (Signed)
A: OA in hands.  P: Treat with tylenol.  Step up therapy prn.

## 2012-09-12 NOTE — Assessment & Plan Note (Signed)
Elevated in office. Referred for ambulatory BP monitoring. CMP, TSH today.

## 2012-09-12 NOTE — Patient Instructions (Addendum)
Noralee Space,   Gracias por venido hoy.   Congratulations on your new grandson.  I will have my staff call or send a letter about your lab results.  Please schedule and appointment with Dr. Raymondo Band for 24 hr blood pressure monitoring.  Dr. Raymondo Band will suggest f/u with me based on the 24 hr monitoring results.   Screening reminders: Colonoscopy due in 2014.   F/u physical in one year.  Dr. Armen Pickup

## 2012-09-12 NOTE — Assessment & Plan Note (Signed)
Overall healthy. Flu shot and Tdap today.

## 2012-09-12 NOTE — Progress Notes (Signed)
Subjective:     Patient ID: Tammy [name], female   DOB: [birth year], 56 y.o.   MRN: [id number]  HPI 56 yo Hispanic F presents for physical. History obtained with spanish interpreter.  She has no complaints.   She reports arthralgias at the PIP joints of her hands  bilaterally. This is chronic. Pain is exacerbated by repetitive motion. She works as a Child psychotherapist at [employer]. She takes motrin occassionally for pain. No more than 3000 mg in one day. She denies rash, redness, swelling, other joint involvement, weight loss, fever and fatigue.   Elevated BP: chronically elevated in clinic. She checks it at drugstores and values range 120-130s/80s. She denies HA, blurred vision, CP, SOB and LE edema. She has never been on antihypertensive mediations.   Patient Information Form: Screening and ROS  AUDIT-C Score: 0 Do you feel safe in relationships? yes PHQ-2:negative  Review of Symptoms  General:  Negative for nexplained weight loss, fever Skin: Negative for new or changing mole, sore that won't heal HEENT: Negative for trouble hearing, trouble seeing, ringing in ears, mouth sores, hoarseness, change in voice, dysphagia. CV:  Negative for chest pain, dyspnea, edema, palpitations Resp: Negative for cough, dyspnea, hemoptysis GI: Negative for nausea, vomiting, diarrhea, constipation, abdominal pain, melena, hematochezia. GU: Negative for dysuria, incontinence, urinary hesitance, hematuria, vaginal or penile discharge, polyuria, sexual difficulty, lumps in testicle or breasts MSK: Positive for joint pain. Negative for muscle cramps or aches or swelling Neuro: Negative for headaches, weakness, numbness, dizziness, passing out/fainting Psych: Negative for depression, anxiety, memory problems  Review of Systems     Objective:   Physical Exam BP 158/98  Pulse 96  Temp 98.1 F (36.7 C) (Oral)  Ht 5' (1.524 m)  Wt 130 lb (58.968 kg)  BMI 25.39 kg/m2 General appearance: alert, cooperative  and no distress Head: Normocephalic, without obvious abnormality, atraumatic Eyes: conjunctivae/corneas clear. PERRL, EOM's intact.  Ears: normal TM's and external ear canals both ears Nose: Nares normal. Septum midline. Mucosa normal. No drainage or sinus tenderness. Throat: lips, mucosa, and tongue normal; teeth and gums normal Neck: no adenopathy, no carotid bruit, no JVD, supple, symmetrical, trachea midline and thyroid not enlarged, symmetric, no tenderness/mass/nodules Lungs: clear to auscultation bilaterally Heart: regular rate and rhythm, S1, S2 normal, no murmur, click, rub or gallop Abdomen: soft, non-tender; bowel sounds normal; no masses,  no organomegaly Extremities: extremities normal, atraumatic, no cyanosis or edema Pulses: 2+ and symmetric Skin: Skin color, texture, turgor normal. No rashes or lesions Neurologic: Grossly normal     Assessment and Plan:

## 2012-09-12 NOTE — Assessment & Plan Note (Signed)
Diet controlled. LDL goal of < 160 based on medical history.  Checked FLP today.  If LDL goal is >190 initiate pharmacotherapy.

## 2012-09-15 ENCOUNTER — Encounter: Payer: Self-pay | Admitting: Family Medicine

## 2012-11-28 ENCOUNTER — Emergency Department (HOSPITAL_COMMUNITY)
Admission: EM | Admit: 2012-11-28 | Discharge: 2012-11-28 | Disposition: A | Discharge status: Home / Self Care | Payer: Worker's Compensation | Attending: Emergency Medicine | Admitting: Emergency Medicine

## 2012-11-28 ENCOUNTER — Encounter (HOSPITAL_COMMUNITY): Payer: Self-pay | Admitting: Emergency Medicine

## 2012-11-28 ENCOUNTER — Emergency Department (HOSPITAL_COMMUNITY): Payer: Worker's Compensation

## 2012-11-28 DIAGNOSIS — Y9289 Other specified places as the place of occurrence of the external cause: Secondary | ICD-10-CM | POA: Insufficient documentation

## 2012-11-28 DIAGNOSIS — Z8739 Personal history of other diseases of the musculoskeletal system and connective tissue: Secondary | ICD-10-CM | POA: Insufficient documentation

## 2012-11-28 DIAGNOSIS — M79603 Pain in arm, unspecified: Secondary | ICD-10-CM

## 2012-11-28 DIAGNOSIS — S4980XA Other specified injuries of shoulder and upper arm, unspecified arm, initial encounter: Secondary | ICD-10-CM | POA: Insufficient documentation

## 2012-11-28 DIAGNOSIS — S46909A Unspecified injury of unspecified muscle, fascia and tendon at shoulder and upper arm level, unspecified arm, initial encounter: Secondary | ICD-10-CM | POA: Insufficient documentation

## 2012-11-28 DIAGNOSIS — W19XXXA Unspecified fall, initial encounter: Secondary | ICD-10-CM

## 2012-11-28 DIAGNOSIS — Y9389 Activity, other specified: Secondary | ICD-10-CM | POA: Insufficient documentation

## 2012-11-28 DIAGNOSIS — Z860100 Personal history of colon polyps, unspecified: Secondary | ICD-10-CM | POA: Insufficient documentation

## 2012-11-28 DIAGNOSIS — Z79899 Other long term (current) drug therapy: Secondary | ICD-10-CM | POA: Insufficient documentation

## 2012-11-28 DIAGNOSIS — M542 Cervicalgia: Secondary | ICD-10-CM | POA: Insufficient documentation

## 2012-11-28 DIAGNOSIS — W010XXA Fall on same level from slipping, tripping and stumbling without subsequent striking against object, initial encounter: Secondary | ICD-10-CM | POA: Insufficient documentation

## 2012-11-28 NOTE — ED Notes (Signed)
Pt states that she was at work and fell on her R side causing pain in her R shoulder, elbow and R side.

## 2012-11-28 NOTE — ED Provider Notes (Signed)
History  Scribed for [employer], PA-C/Stephen Kohut, MD, the patient was seen in room WTR9/WTR9. This chart was scribed by Candelaria Stagers. The patient's care started at 5:50 PM   CSN: [id number]  Arrival date & time 11/28/12  1620   First MD Initiated Contact with Patient 11/28/12 1642      Chief Complaint  Patient presents with  . Fall  . Arm Pain     The history is provided by the patient and the spouse. A language interpreter was used.   Tammy [name] is a 57 y.o. female who presents to the Emergency Department complaining of pain to her right shoulder, right elbow, and right side after tripping and falling while working earlier today.  Pain increased with palpation and movement.  Pt denies hitting her head or LOC.  She denies numbness or tingling.   Denies chest pain or SOB.  No dizziness or lightheadedness.  She has not taken anything for pain prior to arrival in the ED.   Past Medical History  Diagnosis Date  . Arthritis   . Polyp of colon 11/14/2008    seen on colonoscopy, f/u colonoscopy recommended in 5 years.     Past Surgical History  Procedure Date  . Abdominal hysterectomy 01/24/2007    including cervix, benign    History reviewed. No pertinent family history.  History  Substance Use Topics  . Smoking status: Never Smoker   . Smokeless tobacco: Never Used  . Alcohol Use: No    OB History    Grav Para Term Preterm Abortions TAB SAB Ect Mult Living                  Review of Systems  Constitutional: Negative for fever and chills.  HENT: Positive for neck pain.   Respiratory: Negative for shortness of breath.   Gastrointestinal: Negative for nausea and vomiting.  Musculoskeletal: Positive for arthralgias (right shoulder pain, right elbow pain).  Neurological: Negative for weakness.    Allergies  Review of patient's allergies indicates no known allergies.  Home Medications   Current Outpatient Rx  Name  Route  Sig  Dispense  Refill  .  OMEGA-3 FATTY ACIDS 1000 MG PO CAPS   Oral   Take 1 g by mouth daily.         Marland Kitchen GARLIC PO   Oral   Take 1 capsule by mouth every morning.         . ADULT MULTIVITAMIN W/MINERALS CH   Oral   Take 1 tablet by mouth daily.         Marland Kitchen VITAMIN A PO   Oral   Take 1 capsule by mouth every morning.         Marland Kitchen VITAMIN E PO   Oral   Take 1 capsule by mouth every morning.           BP 156/90  Pulse 85  Temp 98 F (36.7 C)  SpO2 100%  Physical Exam  Nursing note and vitals reviewed. Constitutional: She is oriented to person, place, and time. She appears well-developed and well-nourished. No distress.  HENT:  Head: Normocephalic and atraumatic.  Mouth/Throat: Oropharynx is clear and moist.  Eyes: EOM are normal.  Neck: Neck supple. No tracheal deviation present.  Cardiovascular: Normal rate and regular rhythm.   Pulmonary/Chest: Effort normal. No respiratory distress. She has no wheezes. She has no rales.  Musculoskeletal: Normal range of motion.       Tenderness to  palpation of right shoulder.  No tenderness to right clavicle.  Tenderness of the C spine.  No tenderness of the thoracic or lumbar spine.  Pain with ROM of the right shoulder.  Good ROM of the right elbow, some tenderness to palpation.  Humerus non tender.  Wrist non tender normal ROM.  No deformity noted.  No snuff box tenderness.  Good radial pulses.  Distal sensation of the right fingers intact.  Good ROM of the left arm.  No tenderness to palpation of left arm, elbow, or wrist.  Good ROM of both knees, no pain.  Good ROM of hips without pain.  Normal gait, no pain with ambulation.  No ataxia.    Neurological: She is alert and oriented to person, place, and time.  Skin: Skin is warm and dry.  Psychiatric: She has a normal mood and affect. Her behavior is normal.    ED Course  Procedures  DIAGNOSTIC STUDIES: Oxygen Saturation is 100% on room air, normal by my interpretation.    COORDINATION OF  CARE:  17:59 Ordered: DG Elbow Complete Right; DG S   Labs Reviewed - No data to display Dg Cervical Spine Complete  11/28/2012  *RADIOLOGY REPORT*  Clinical Data: 57 year old female with neck pain following fall.  CERVICAL SPINE - COMPLETE 4+ VIEW  Comparison: None  Findings: Normal alignment is noted. There is no evidence of fracture, subluxation or prevertebral soft tissue swelling. Mild multilevel degenerative disc disease and spondylosis noted from C4-C7. No significant bony foraminal narrowing is identified. No focal bony lesions are noted.  IMPRESSION: No static evidence of acute injury to the cervical spine.  Mild multilevel degenerative disc disease/spondylosis.   Original Report Authenticated By: Harmon Pier, M.D.    Dg Shoulder Right  11/28/2012  *RADIOLOGY REPORT*  Clinical Data: 57 year old female with right shoulder pain following fall.  RIGHT SHOULDER - 2+ VIEW  Comparison: None  Findings: There is no evidence of fracture, subluxation or dislocation. No focal bony lesions are present. Minimal degenerative changes are noted at the [facility] and glenohumeral joints.  IMPRESSION: No evidence of acute bony abnormality.   Original Report Authenticated By: Harmon Pier, M.D.    Dg Elbow Complete Right  11/28/2012  *RADIOLOGY REPORT*  Clinical Data: Right elbow pain following fall.  RIGHT ELBOW - COMPLETE 3+ VIEW  Comparison: None  Findings: No evidence of acute fracture, subluxation or dislocation identified.  No joint effusion noted.  No radio-opaque foreign bodies are present.  No focal bony lesions are noted.  The joint spaces are unremarkable.  IMPRESSION:  No evidence of acute abnormality.   Original Report Authenticated By: Harmon Pier, M.D.      No diagnosis found.    MDM  Negative xrays.  Neurovascularly intact.  Patient discharged home.  Return precautions discussed.   I personally performed the services described in this documentation, which was scribed in my presence. The recorded  information has been reviewed and is accurate.      Pascal Lux [place], PA-C 11/29/12 1342

## 2012-12-02 NOTE — ED Provider Notes (Signed)
Medical screening examination/treatment/procedure(s) were performed by non-physician practitioner and as supervising physician I was immediately available for consultation/collaboration.  Raeford Razor, MD 12/02/12 2356

## 2013-02-13 ENCOUNTER — Encounter: Payer: Self-pay | Admitting: Family Medicine

## 2013-02-13 ENCOUNTER — Ambulatory Visit (INDEPENDENT_AMBULATORY_CARE_PROVIDER_SITE_OTHER): Payer: Managed Care, Other (non HMO) | Admitting: Family Medicine

## 2013-02-13 VITALS — BP 130/82 | HR 84 | Temp 97.9°F | Ht 60.0 in | Wt 124.0 lb

## 2013-02-13 DIAGNOSIS — R109 Unspecified abdominal pain: Secondary | ICD-10-CM

## 2013-02-13 NOTE — Progress Notes (Signed)
Tammy [name] is a 57 y.o. female who presents to [facility] today for abdominal pain and nausea  Pt appt assisted by language line interpretor.   Sx started gradually last week but have gotten progressively worse. Improves w/ sleep and sitting down. Pain is worse when breathing, or laughing. Rt sided. Sharp and intermittent. Only comes for a brief second. Denies fever, diarrhea, constipation, difficulty sleeping. Change in diet recently due to change in diet and exercise in an effort to lose wt. 6lb wt loss noted today. Pt does still have gallbladder. Hysterectomy performed open in 2005. Fall in January and hit R side. Denies sick contacts. BM daily Nausea when looking at food.    The following portions of the patient's history were reviewed and updated as appropriate: allergies, current medications, past medical history, family and social history, and problem list.  Patient is a nonsmoker  Past Medical History  Diagnosis Date  . Arthritis   . Polyp of colon 11/14/2008    seen on colonoscopy, f/u colonoscopy recommended in 5 years.     ROS as above otherwise neg.    Medications reviewed. Current Outpatient Prescriptions  Medication Sig Dispense Refill  . fish oil-omega-3 fatty acids 1000 MG capsule Take 1 g by mouth daily.      Marland Kitchen GARLIC PO Take 1 capsule by mouth every morning.      . Multiple Vitamin (MULTIVITAMIN WITH MINERALS) TABS Take 1 tablet by mouth daily.      Marland Kitchen VITAMIN A PO Take 1 capsule by mouth every morning.      Marland Kitchen VITAMIN E PO Take 1 capsule by mouth every morning.       No current facility-administered medications for this visit.    Exam:  BP 130/82  Pulse 84  Temp(Src) 97.9 F (36.6 C) (Oral)  Ht 5' (1.524 m)  Wt 124 lb (56.246 kg)  BMI 24.22 kg/m2 Gen: Well NAD HEENT: EOMI,  MMM Lungs: CTABL Nl WOB Heart: RRR no MRG Abd: tender to palpation along the 11-12th ribs. NABS. Pain in RUQ deep on deep inspiration. No organomegaly, no pain at Mcburny's point or  Murphy's sign Exts: Non edematous BL  LE, warm and well perfused.   No results found for this or any previous visit (from the past 72 hour(s)).

## 2013-02-13 NOTE — Assessment & Plan Note (Signed)
No obvious etiology. Likely strong MSK component given previous fall, exercise, and wt loss.  Precautions reviewed Pt w/ previous elevated Alk Phos. But liver fxn nml. If worsens would recommend imaging or f/u labwork. Home stretching exercises explained.  Ibuprofen 600-800mg  Q6hrs for the next 2-3 days.  Pt does not want PRN zofran

## 2013-02-13 NOTE — Patient Instructions (Addendum)
Thank you for coming in today Your pain is likely musculoskeletal, and nothing serious Please come back if you develop fever, severe constipation, bloody bowel movements, pain after meals Please start taking ibuprofen 600-800mg  every 6-8huors for pain. Please start stretching your side as demonstrated. Have a great day. You will get over this no problem  Gracias por venir hoy  El dolor musculoesqueltico es probable, y nada serio  Por favor regrese si desarrolla fiebre, estreimiento severo, heces con sangre, dolor despus de las comidas  Por favor, empezar a tomar ibuprofeno 600-800mg  cada 6-8huors para Chief Technology Officer. Por favor, comience estirando su lado como se ha demostrado.  Que tengan un Marine scientist. Usted recibir ms de esto no hay problema  Management consultant (Musculoskeletal Pain) El dolor musculoesqueltico se siente en huesos y msculos. El dolor puede ocurrir en cualquier parte del cuerpo. El profesional que lo asiste podr tratarlo sin Geologist, engineering causa del dolor. Lo tratar [employer] de laboratorio (sangre y [place]), las radiografas y otros estudios sean normales. La causa de estos dolores puede ser un virus.  CAUSAS Generalmente no existe una causa definida para este trastorno. Tambin el [employer] puede deberse a la [place]. En la actividad excesiva se incluye el hacer ejercicios fsicos muy intensos cuando no se est en buena forma. El dolor de huesos tambin puede deberse a cambios climticos. Los huesos son sensibles a los cambios en la presin atmosfrica. INSTRUCCIONES PARA EL CUIDADO DOMICILIARIO  Para proteger su privacidad, no se entregarn los [employer] pruebas por telfono. Asegrese de conseguirlos. Consulte el modo en que podr obtenerlos si no se lo han informado. Es su responsabilidad contar con los [employer].  Utilice los medicamentos de venta libre o de prescripcin para Chief Technology Officer, Environmental health practitioner o la Home, segn se lo  indique el profesional que lo asiste.Si le han administrado medicamentos, no conduzca, no opere maquinarias ni Diplomatic Services operational officer, y tampoco firme documentos legales durante 24 horas. No beba alcohol. No tome pldoras para dormir ni otros medicamentos que Museum/gallery curator.  Podr seguir con todas las actividades a menos que stas le ocasionen ms [employer]. Cuando el dolor disminuya, es importante que gradualmente reanude toda la rutina habitual. Retome las actividades comenzando lentamente. Aumente gradualmente la intensidad y la duracin de sus actividades o del ejercicio.  Durante los perodos de dolor intenso, el reposo en cama puede ser beneficioso. Recustese o sintese en la posicin que le sea ms cmoda.  Coloque hielo sobre la zona afectada.  Ponga hielo en Lucile Shutters.  Colquese una toalla entre la piel y la bolsa de hielo.  Aplique el hielo durante 10 a 20 minutos 3  4 veces por da.  Si el dolor empeora, o no desaparece puede ser [employer] repetir las pruebas o Education officer, environmental nuevos exmenes. El profesional que lo asiste podr requerir investigar ms profundamente para Veterinary surgeon causa posible. SOLICITE ATENCIN MDICA DE INMEDIATO SI:  Siente que el dolor empeora y no se alivia con los medicamentos.  Siente dolor en el pecho asociado a falta de aire, sudoracin, nuseas o vmitos.  El dolor se localiza en el abdomen.  Comienza a sentir nuevos sntomas que parecen ser diferentes o que lo preocupan. ASEGRESE DE QUE:   Comprende las instrucciones para el alta mdica.  Controlar su enfermedad.  Solicitar atencin mdica de inmediato segn las indicaciones. Document Released: 08/18/2005 Document Revised: 01/31/2012 [facility] Patient Information 2013 [place], [place].

## 2013-02-20 ENCOUNTER — Encounter: Payer: Self-pay | Admitting: Family Medicine

## 2013-02-20 ENCOUNTER — Ambulatory Visit (INDEPENDENT_AMBULATORY_CARE_PROVIDER_SITE_OTHER): Payer: Managed Care, Other (non HMO) | Admitting: Family Medicine

## 2013-02-20 VITALS — BP 160/100 | HR 88 | Temp 98.1°F | Ht 60.0 in | Wt 126.2 lb

## 2013-02-20 DIAGNOSIS — R109 Unspecified abdominal pain: Secondary | ICD-10-CM

## 2013-02-20 DIAGNOSIS — I10 Essential (primary) hypertension: Secondary | ICD-10-CM

## 2013-02-20 LAB — COMPREHENSIVE METABOLIC PANEL
ALT: 17 U/L (ref 0–35)
AST: 29 U/L (ref 0–37)
CO2: 31 mEq/L (ref 19–32)
Calcium: 10.1 mg/dL (ref 8.4–10.5)
Chloride: 95 mEq/L — ABNORMAL LOW (ref 96–112)
Sodium: 135 mEq/L (ref 135–145)
Total Bilirubin: 0.5 mg/dL (ref 0.3–1.2)
Total Protein: 7 g/dL (ref 6.0–8.3)

## 2013-02-20 LAB — LDL CHOLESTEROL, DIRECT: Direct LDL: 154 mg/dL — ABNORMAL HIGH

## 2013-02-20 MED ORDER — AMLODIPINE BESYLATE 5 MG PO TABS: 5.0000 mg | ORAL_TABLET | Freq: Every day | ORAL | Status: DC

## 2013-02-20 MED ORDER — TRAMADOL HCL 50 MG PO TABS: 50.0000 mg | ORAL_TABLET | Freq: Three times a day (TID) | ORAL | Status: DC | PRN

## 2013-02-20 NOTE — Assessment & Plan Note (Signed)
A: still sounds like MSK pain. Considered biliary colic.  P: CMP Tramadol If CMP normal schedule tylenol 650 TID If CMP abnormal, f/u RUQ [place].

## 2013-02-20 NOTE — Progress Notes (Signed)
Subjective:     Patient ID: Tammy [name], female   DOB: 10/04/1956, 57 y.o.   MRN: [id number]  HPI 57 yo F presents for f/u visit to discuss the following:  1. LUQ pain: x 3 weeks now. Seen in clinic last week. Symptoms persist. She reports sharp LUQ pain that sometimes radiates down and back. Pain occurs daily when she laughs, coughs, sneezes, takes a deep breath and changes position from sitting to lying. Pain last as long as the activity last, so seconds to minutes. She denies association with food. Had nausea last week. No nausea or vomiting currently. No fever. No dysuria. Admits to trauma, fall onto R side at work two months ago. No pain immediately. Tried ibuprofen w/o pain relief.   2. HTN: compliant with low salt diet. Exercising to lose weight. Has lost 4 lbs. Denies HA, vision changes, CP, SOB and LE edema.   Review of Systems As per HPI     Objective:   Physical Exam BP 160/100  Pulse 88  Temp(Src) 98.1 F (36.7 C) (Oral)  Ht 5' (1.524 m)  Wt 126 lb 3.2 oz (57.244 kg)  BMI 24.65 kg/m2 General appearance: alert, cooperative and no distress Neck: no adenopathy, no carotid bruit, no JVD, supple, symmetrical, trachea midline and thyroid not enlarged, symmetric, no tenderness/mass/nodules Back: symmetric, no curvature. ROM normal. No CVA tenderness. Lungs: clear to auscultation bilaterally Heart: regular rate and rhythm, S1, S2 normal, no murmur, click, rub or gallop Abdomen: soft, no masses, no tenderness, pain reproduced with deep breathing and lying from sitting. no jaundice on exam. NABS.  Extremities: extremities normal, atraumatic, no cyanosis or edema Pulses: 2+ and symmetric     Assessment and Plan:

## 2013-02-20 NOTE — Assessment & Plan Note (Signed)
A: above goal. P: CMP norvasc 5 mg daily F/u in two week for RN visit BP check. If BP elevated will tell patient to increase to two tabs daily (10 mg).  F/u with me in 4 weeks.

## 2013-02-20 NOTE — Patient Instructions (Addendum)
Tammy [name],  Thank you for coming in today.  For abdominal pain: Most likely muscle pain Start tramadol as needed, Will check blood work. If liver function test normal, will start schedule tylenol.  If liver function test abnormal, will order ultrasound.  HTN:  Start novasc 5 mg daily. Continue low salt diet. Exercise regularly, 20-30 mins 4-5 days per week.  Return for nurse visit in two weeks for BP check. See me in 4 weeks.   Dr. Armen Pickup

## 2013-02-22 ENCOUNTER — Telehealth: Payer: Self-pay | Admitting: Family Medicine

## 2013-02-22 DIAGNOSIS — R748 Abnormal levels of other serum enzymes: Secondary | ICD-10-CM

## 2013-02-22 NOTE — Telephone Encounter (Signed)
Please call patient and tell her the following:  Elevated alk phos, other liver function test normal. Plan- f/u labs ordered GGT to confirm liver source of elevated alk phos.  Lipase also ordered to evaluate pancrease because I suspect partial bile duct obstruction given associated abdominal pain.   Patient to come in at her earliest convenience for f/u  blood work.  Please set up a time with front office staff.  I will review and contact her with next step in plan.

## 2013-03-20 ENCOUNTER — Ambulatory Visit: Payer: Self-pay | Admitting: Family Medicine

## 2013-04-18 ENCOUNTER — Other Ambulatory Visit (HOSPITAL_COMMUNITY): Payer: Self-pay | Admitting: Nurse Practitioner

## 2013-04-18 DIAGNOSIS — R160 Hepatomegaly, not elsewhere classified: Secondary | ICD-10-CM

## 2013-04-24 ENCOUNTER — Other Ambulatory Visit: Payer: Self-pay | Admitting: Radiology

## 2013-04-26 ENCOUNTER — Other Ambulatory Visit: Payer: Self-pay | Admitting: Radiology

## 2013-04-30 ENCOUNTER — Encounter (HOSPITAL_COMMUNITY): Payer: Self-pay | Admitting: Pharmacy Technician

## 2013-05-01 ENCOUNTER — Ambulatory Visit (HOSPITAL_COMMUNITY)
Admission: RE | Admit: 2013-05-01 | Discharge: 2013-05-01 | Disposition: A | Discharge status: Home / Self Care | Payer: Managed Care, Other (non HMO) | Source: Ambulatory Visit | Attending: Nurse Practitioner | Admitting: Nurse Practitioner

## 2013-05-01 ENCOUNTER — Encounter (HOSPITAL_COMMUNITY): Payer: Self-pay

## 2013-05-01 DIAGNOSIS — R1011 Right upper quadrant pain: Secondary | ICD-10-CM | POA: Insufficient documentation

## 2013-05-01 DIAGNOSIS — R160 Hepatomegaly, not elsewhere classified: Secondary | ICD-10-CM

## 2013-05-01 DIAGNOSIS — C228 Malignant neoplasm of liver, primary, unspecified as to type: Secondary | ICD-10-CM | POA: Insufficient documentation

## 2013-05-01 DIAGNOSIS — D490 Neoplasm of unspecified behavior of digestive system: Secondary | ICD-10-CM

## 2013-05-01 HISTORY — DX: Neoplasm of unspecified behavior of digestive system: D49.0

## 2013-05-01 LAB — CBC
Hemoglobin: 14.1 g/dL (ref 12.0–15.0)
MCH: 29 pg (ref 26.0–34.0)
MCHC: 34.3 g/dL (ref 30.0–36.0)
MCV: 84.4 fL (ref 78.0–100.0)

## 2013-05-01 LAB — PROTIME-INR: Prothrombin Time: 13.9 seconds (ref 11.6–15.2)

## 2013-05-01 MED ORDER — FENTANYL CITRATE 0.05 MG/ML IJ SOLN
INTRAMUSCULAR | Status: AC
  Filled 2013-05-01: qty 6

## 2013-05-01 MED ORDER — FENTANYL CITRATE 0.05 MG/ML IJ SOLN
INTRAMUSCULAR | Status: AC | PRN
  Administered 2013-05-01: 100 ug via INTRAVENOUS

## 2013-05-01 MED ORDER — MIDAZOLAM HCL 2 MG/2ML IJ SOLN
INTRAMUSCULAR | Status: DC | PRN
  Administered 2013-05-01: 1 mg via INTRAVENOUS

## 2013-05-01 MED ORDER — SODIUM CHLORIDE 0.9 % IV SOLN
INTRAVENOUS | Status: DC
  Administered 2013-05-01: 11:00:00 via INTRAVENOUS

## 2013-05-01 MED ORDER — MIDAZOLAM HCL 2 MG/2ML IJ SOLN
INTRAMUSCULAR | Status: AC | PRN
  Administered 2013-05-01: 1 mg via INTRAVENOUS

## 2013-05-01 MED ORDER — MIDAZOLAM HCL 2 MG/2ML IJ SOLN
INTRAMUSCULAR | Status: AC
  Filled 2013-05-01: qty 6

## 2013-05-01 NOTE — H&P (Signed)
Tammy [name] is an 57 y.o. female.   Chief Complaint: abdominal pain, liver lesions HPI: Patient with history of abdominal pain , weight loss, nausea and recent imaging studies revealing a large central hepatic mass with surrounding satellite lesions presents today for US guided liver biopsy.  Past Medical History  Diagnosis Date  . Arthritis   . Polyp of colon 11/14/2008    seen on colonoscopy, f/u colonoscopy recommended in 5 years.     Past Surgical History  Procedure Laterality Date  . Abdominal hysterectomy  01/24/2007    including cervix, benign    History reviewed. No pertinent family history. Social History:  reports that she has never smoked. She has never used smokeless tobacco. She reports that she does not drink alcohol or use illicit drugs.  Allergies: No Known Allergies  No current outpatient prescriptions on file. Current facility-administered medications:0.9 %  sodium chloride infusion, , Intravenous, Continuous, D Jeananne Rama, PA-C, Last Rate: 20 mL/hr at 05/01/13 1121   Results for orders placed during the hospital encounter of 05/01/13 (from the past 48 hour(s))  APTT     Status: None   Collection Time    05/01/13 11:20 AM      Result Value Range   aPTT 27  24 - 37 seconds  CBC     Status: None   Collection Time    05/01/13 11:20 AM      Result Value Range   WBC 9.1  4.0 - 10.5 K/uL   RBC 4.87  3.87 - 5.11 MIL/uL   Hemoglobin 14.1  12.0 - 15.0 g/dL   HCT 60.4  54.0 - 98.1 %   MCV 84.4  78.0 - 100.0 fL   MCH 29.0  26.0 - 34.0 pg   MCHC 34.3  30.0 - 36.0 g/dL   RDW 19.1  47.8 - 29.5 %   Platelets 216  150 - 400 K/uL  PROTIME-INR     Status: None   Collection Time    05/01/13 11:20 AM      Result Value Range   Prothrombin Time 13.9  11.6 - 15.2 seconds   INR 1.08  0.00 - 1.49   No results found.  Review of Systems  Constitutional: Positive for weight loss. Negative for fever and chills.  Respiratory: Negative for cough and shortness of  breath.   Cardiovascular: Negative for chest pain.  Gastrointestinal: Positive for abdominal pain. Negative for vomiting.       Occ nausea  Musculoskeletal: Positive for back pain.  Neurological: Negative for headaches.  Endo/Heme/Allergies: Does not bruise/bleed easily.  Psychiatric/Behavioral: The patient is nervous/anxious.     Blood pressure 158/94, pulse 125, temperature 98 F (36.7 C), temperature source Oral, resp. rate 18, SpO2 100.00%. Physical Exam  Constitutional: She is oriented to person, place, and time. She appears well-developed and well-nourished.  Cardiovascular:  Tachy but regular rhythm  Respiratory: Effort normal and breath sounds normal.  GI: Soft. Bowel sounds are normal. There is tenderness.  Musculoskeletal: Normal range of motion.  Neurological: She is alert and oriented to person, place, and time.     Assessment/Plan Pt with hx abd pain, wt loss, nausea and recent imaging revealing large central hepatic mass with satellite lesions. Plan is for US guided liver lesion biopsy today. Details/risks of procedure d/w pt/husband with their understanding and consent.  Kayin Kettering,D KEVIN 05/01/2013, 11:58 AM

## 2013-05-01 NOTE — Procedures (Signed)
Successful [place] RT LIVER MASS 18 G CORE BX NO COMP STABLE FULL REPORT IN PACS PATH PENDING

## 2013-05-07 ENCOUNTER — Telehealth: Payer: Self-pay | Admitting: *Deleted

## 2013-05-07 MED ORDER — AMLODIPINE BESYLATE 5 MG PO TABS: 5.0000 mg | ORAL_TABLET | Freq: Every day | ORAL | Status: DC

## 2013-05-07 NOTE — Telephone Encounter (Signed)
Pt would like 90 day supply of amlodipine 5 mg not listed on MAR  - please advise Wyatt Haste, RN-BSN

## 2013-05-07 NOTE — Telephone Encounter (Signed)
90 day supply of norvasc sent in. Patient is past due for HTN  f/u appt please ask her to schedule one.

## 2013-05-08 NOTE — Telephone Encounter (Signed)
Mobile number called and message left with spouse for pt to make follow up appointment. Wyatt Haste, RN-BSN

## 2013-05-09 ENCOUNTER — Encounter (HOSPITAL_COMMUNITY): Payer: Self-pay | Admitting: Emergency Medicine

## 2013-05-09 ENCOUNTER — Emergency Department (HOSPITAL_COMMUNITY)
Admission: EM | Admit: 2013-05-09 | Discharge: 2013-05-09 | Disposition: A | Discharge status: Home / Self Care | Payer: Managed Care, Other (non HMO) | Attending: Emergency Medicine | Admitting: Emergency Medicine

## 2013-05-09 ENCOUNTER — Telehealth: Payer: Self-pay | Admitting: Internal Medicine

## 2013-05-09 DIAGNOSIS — Z860100 Personal history of colon polyps, unspecified: Secondary | ICD-10-CM | POA: Insufficient documentation

## 2013-05-09 DIAGNOSIS — Z79899 Other long term (current) drug therapy: Secondary | ICD-10-CM | POA: Insufficient documentation

## 2013-05-09 DIAGNOSIS — R110 Nausea: Secondary | ICD-10-CM | POA: Insufficient documentation

## 2013-05-09 DIAGNOSIS — Z8739 Personal history of other diseases of the musculoskeletal system and connective tissue: Secondary | ICD-10-CM | POA: Insufficient documentation

## 2013-05-09 DIAGNOSIS — Z90710 Acquired absence of both cervix and uterus: Secondary | ICD-10-CM | POA: Insufficient documentation

## 2013-05-09 DIAGNOSIS — C229 Malignant neoplasm of liver, not specified as primary or secondary: Secondary | ICD-10-CM | POA: Insufficient documentation

## 2013-05-09 NOTE — Telephone Encounter (Signed)
C/D 05/09/13 for appt. 05/10/13

## 2013-05-09 NOTE — ED Notes (Addendum)
Phone interpretive services used to communicate with patient. Pt reports 9/10 abdominal pain that radiates towards her back. Pt was seen on 6/10 for ultrasound and liver biopsy. Pt states that the results came back as cancerous cells. Pt states she has been nauseated for one month however denies vomiting or diarrhea. Pt denies taking pain medication. Pt is concerned that she has been sent numerous places after her diagnoses and hasn't had any treatment recommendations made. Pt states she does not have a PCP.

## 2013-05-09 NOTE — ED Provider Notes (Signed)
History     CSN: [id number]  Arrival date & time 05/09/13  1507   First MD Initiated Contact with Patient 05/09/13 1521      Chief Complaint  Patient presents with  . Abdominal Pain    (Consider location/radiation/quality/duration/timing/severity/associated sxs/prior treatment) HPI  Interview done with spanish phone interpretor  Patient is here with two family members because she was told that her liver biopsy results are positive for adenocarcinoma yesterday and she does not not what to do next. She said that she called her PCP but they did not answer. She has had pain for 2 months with nausea. It has not changed or worsen today at her visit. Pt is hoping that we can help her determine what the plan is and that maybe she can start treatment today. Her PCP is Dr. Armen Pickup, J. With [facility].  Patient refuses blood draw and does not want medicine for nausea or vomiting.  Past Medical History  Diagnosis Date  . Arthritis   . Polyp of colon 11/14/2008    seen on colonoscopy, f/u colonoscopy recommended in 5 years.     Past Surgical History  Procedure Laterality Date  . Abdominal hysterectomy  01/24/2007    including cervix, benign    No family history on file.  History  Substance Use Topics  . Smoking status: Never Smoker   . Smokeless tobacco: Never Used  . Alcohol Use: No    OB History   Grav Para Term Preterm Abortions TAB SAB Ect Mult Living                  Review of Systems  Gastrointestinal: Positive for nausea and abdominal pain.  All other systems reviewed and are negative.    Allergies  Review of patient's allergies indicates no known allergies.  Home Medications   Current Outpatient Rx  Name  Route  Sig  Dispense  Refill  . amLODipine (NORVASC) 5 MG tablet   Oral   Take 1 tablet (5 mg total) by mouth daily.   90 tablet   0     BP 156/64  Pulse 108  Temp(Src) 97.4 F (36.3 C) (Oral)  Resp 20  SpO2 95%  Physical Exam  Nursing note and  vitals reviewed. Constitutional: She appears well-developed and well-nourished. No distress.  HENT:  Head: Normocephalic and atraumatic.  Eyes: Pupils are equal, round, and reactive to light.  Neck: Normal range of motion. Neck supple.  Cardiovascular: Normal rate and regular rhythm.   Pulmonary/Chest: Effort normal.  Abdominal: Soft. There is tenderness in the right upper quadrant.  Neurological: She is alert.  Skin: Skin is warm and dry.    ED Course  Procedures (including critical care time)  Labs Reviewed - No data to display No results found.   1. Adenocarcinoma of liver       MDM  Patient and I, with family members spoke with patients PCP on speaker. They set up an appointment with Dr. Ledora Bottcher, oncology to be seen at 12:30pm tomorrow 05/10/2013. The patient is very happy to hear that she has follow-up appointment and would like to go. Does not want work-up, medication or rx.  56 y.o.Tammy [name]'s evaluation in the Emergency Department is complete. It has been determined that no acute conditions requiring further emergency intervention are present at this time. The patient/guardian have been advised of the diagnosis and plan. We have discussed signs and symptoms that warrant return to the ED, such as changes or  worsening in symptoms.  Vital signs are stable at discharge. Filed Vitals:   05/09/13 1525  BP: 156/64  Pulse: 108  Temp: 97.4 F (36.3 C)  Resp: 20    Patient/guardian has voiced understanding and agreed to follow-up with the PCP or specialist.         Dorthula Matas, PA-C 05/09/13 1623

## 2013-05-09 NOTE — Telephone Encounter (Signed)
Pt to see Dr. Arbutus Ped 05/10/13@12 :30 Janis from Dr. Lerry Liner office will notify pt about the appt.

## 2013-05-10 ENCOUNTER — Encounter: Payer: Self-pay | Admitting: Internal Medicine

## 2013-05-10 ENCOUNTER — Other Ambulatory Visit (HOSPITAL_BASED_OUTPATIENT_CLINIC_OR_DEPARTMENT_OTHER): Payer: Managed Care, Other (non HMO) | Admitting: Lab

## 2013-05-10 ENCOUNTER — Ambulatory Visit (HOSPITAL_BASED_OUTPATIENT_CLINIC_OR_DEPARTMENT_OTHER): Payer: Managed Care, Other (non HMO)

## 2013-05-10 ENCOUNTER — Ambulatory Visit (HOSPITAL_BASED_OUTPATIENT_CLINIC_OR_DEPARTMENT_OTHER): Payer: Managed Care, Other (non HMO) | Admitting: Internal Medicine

## 2013-05-10 ENCOUNTER — Telehealth: Payer: Self-pay | Admitting: Internal Medicine

## 2013-05-10 VITALS — BP 137/93 | HR 118 | Temp 98.5°F | Resp 18 | Ht 60.0 in | Wt 116.0 lb

## 2013-05-10 DIAGNOSIS — C221 Intrahepatic bile duct carcinoma: Secondary | ICD-10-CM

## 2013-05-10 LAB — COMPREHENSIVE METABOLIC PANEL (CC13)
ALT: 12 U/L (ref 0–55)
CO2: 28 mEq/L (ref 22–29)
Calcium: 10.4 mg/dL (ref 8.4–10.4)
Chloride: 95 mEq/L — ABNORMAL LOW (ref 98–107)
Potassium: 4 mEq/L (ref 3.5–5.1)
Sodium: 135 mEq/L — ABNORMAL LOW (ref 136–145)
Total Protein: 7.5 g/dL (ref 6.4–8.3)

## 2013-05-10 LAB — LACTATE DEHYDROGENASE (CC13): LDH: 250 U/L — ABNORMAL HIGH (ref 125–245)

## 2013-05-10 LAB — CBC WITH DIFFERENTIAL/PLATELET
Basophils Absolute: 0 10*3/uL (ref 0.0–0.1)
Eosinophils Absolute: 0 10*3/uL (ref 0.0–0.5)
HGB: 14.4 g/dL (ref 11.6–15.9)
MCV: 85.2 fL (ref 79.5–101.0)
NEUT#: 8.5 10*3/uL — ABNORMAL HIGH (ref 1.5–6.5)
RDW: 14.3 % (ref 11.2–14.5)
lymph#: 0.9 10*3/uL (ref 0.9–3.3)

## 2013-05-10 NOTE — Progress Notes (Signed)
Checked in new patient. No financial issues. They want calls and mail for communication. Didn't ask if POA/living will.

## 2013-05-10 NOTE — Progress Notes (Signed)
[facility] Telephone:([phone]) [phone]   Fax:(336) [phone]  CONSULT NOTE  REFERRING PHYSICIAN: Dr. Lerry Liner  REASON FOR CONSULTATION:  57 years old Hispanic female recently diagnosed with cholangiocarcinoma  HPI Tammy [name] is a 57 y.o. female originally from [place] with no significant past medical history. The patient mentions that since March of 2014 she has been complaining of pain in the right upper quadrant. She was treated in the past with ibuprofen and Tylenol as well as tramadol with no significant improvement in her pain management. The patient had MRI of the abdomen with and without contrast performed on 04/18/2013 at [facility]. It showed a large lobular central hepatic mass measuring 7.0 x 10.0 x 10.0 CM. The mass predominantly involved is the anterior segment of the right lobe and medial segment of the left lobe. There are multiple adjacent satellite lesions present. The MRI features are nonspecific but the overall appearance is highly suspicious for primary or metastatic metaplasia. On 05/01/2013 the patient underwent ultrasound guided right liver mass core biopsy by interventional radiology. The final pathology (Accession: [id number]) showed adenocarcinoma in a background of fibrotic stroma. The biopsies are composed of nests, cords and sheets of well differentiated oncocytic cells in a background of dense acellular collagen bundles arranged in nests and cords that may contain small thick-walled vessels. Angiolymphatic invasion is present. Immunohistochemical stains were performed and the malignant cells show the following immuno pattern: Hepar-1 - negative, AFP - negative, Cytokeratin 7 - positive, Cytokeratin 20 - negative, Polyclonal CEA - positive, TTF-1 - negative ER - negative, GCDFP-15- negative, CDX-2- negative, The control stained appropriately. The overall findings are mostly consistent with poorly differentiated adenocarcinoma. Clinical and  radiographic correlation is highly recommended. In the absence of other primary, the morphologic features are mostly consistent with cholangiocarcinoma. The patient was referred to me today for evaluation and recommendation regarding treatment of her condition. She continues to have mild pain in the right upper quadrant and epigastric area. Her pain is better after meals. She lost around 20 pounds in the last few months. She denied having any other significant complaints. She had no chest pain, shortness breath, cough or hemoptysis. She has no nausea or vomiting and no change in the bowel movement.  Her family history is unknown as the patient was raised by another family since she was 57 years old.  The patient is married and has one daughter.she speaks mainly [place] with limited [place]. She was accompanied by her husband today who was able to give the history.  She works at the [employer]. She has no history of smoking but drinks alcohol occasionally and no history of drug abuse. .   @SFHPI @  Past Medical History  Diagnosis Date  . Arthritis   . Polyp of colon 11/14/2008    seen on colonoscopy, f/u colonoscopy recommended in 5 years.     Past Surgical History  Procedure Laterality Date  . Abdominal hysterectomy  01/24/2007    including cervix, benign    History reviewed. No pertinent family history.  Social History History  Substance Use Topics  . Smoking status: Never Smoker   . Smokeless tobacco: Never Used  . Alcohol Use: No    No Known Allergies  No current outpatient prescriptions on file.   No current facility-administered medications for this visit.    Review of Systems  A comprehensive review of systems was negative except for: Constitutional: positive for weight loss Gastrointestinal: positive for abdominal pain  Physical  Exam  ZOX:WRUEA, healthy, no distress, well nourished and well developed SKIN: skin color, texture, turgor are normal HEAD:  Normocephalic, No masses, lesions, tenderness or abnormalities EYES: normal, PERRLA EARS: External ears normal OROPHARYNX:no exudate and no erythema  NECK: supple, no adenopathy LYMPH:  no palpable lymphadenopathy, no hepatosplenomegaly BREAST:not examined LUNGS: clear to auscultation  HEART: regular rate & rhythm, no murmurs and no gallops ABDOMEN:abdomen soft, non-tender, normal bowel sounds and no masses or organomegaly BACK: Back symmetric, no curvature. EXTREMITIES:no joint deformities, effusion, or inflammation, no edema, no skin discoloration, no clubbing  NEURO: alert & oriented x 3 with fluent speech, no focal motor/sensory deficits  PERFORMANCE STATUS: ECOG 1  LABORATORY DATA: Lab Results  Component Value Date   WBC 10.3 05/10/2013   HGB 14.4 05/10/2013   HCT 41.2 05/10/2013   MCV 85.2 05/10/2013   PLT 244 05/10/2013      Chemistry      Component Value Date/Time   NA 135 02/20/2013 1640   K 3.9 02/20/2013 1640   CL 95* 02/20/2013 1640   CO2 31 02/20/2013 1640   BUN 12 02/20/2013 1640   CREATININE 0.39* 02/20/2013 1640   CREATININE 0.54 06/24/2010 1951      Component Value Date/Time   CALCIUM 10.1 02/20/2013 1640   ALKPHOS 155* 02/20/2013 1640   AST 29 02/20/2013 1640   ALT 17 02/20/2013 1640   BILITOT 0.5 02/20/2013 1640       RADIOGRAPHIC STUDIES: US Biopsy  05/01/2013   *RADIOLOGY REPORT*  Clinical Data: Large solid liver mass, right upper quadrant abdominal pain  ULTRASOUND RIGHT LIVER MASS 18 GAUGE CORE BIOPSY  Date:  05/01/2013 13:00:00  Radiologist:  M. Ruel Favors, M.D.  Medications:  2 mg Versed, 100 mcg Fentanyl  Guidance:  Ultrasound  Fluoroscopy time:  None.  Sedation time:  15 minutes  Contrast volume:  None.  Complications:  None.  PROCEDURE/FINDINGS:  Informed consent was obtained from the patient following explanation of the procedure, risks, benefits and alternatives. The patient understands, agrees and consents for the procedure. All questions were addressed.  A time  out was performed.  Maximal barrier sterile technique utilized including caps, mask, sterile gowns, sterile gloves, large sterile drape, hand hygiene, and betadine  Previous imaging reviewed.  Preliminary ultrasound performed.  The large right hepatic mass was visualized through a right intercostal space in the mid axillary line.  Under sterile conditions and local anesthesia, a 17 gauge 6.8 cm access needle was advanced into the mass under direct ultrasound.  Images obtained for documentation. Three core biopsies obtained.  These were intact and non fragmented.  Samples placed in formalin.  Needles removed.  Post procedure imaging demonstrates no hemorrhage or hematoma.  The patient tolerated biopsy well.  IMPRESSION: Successful ultrasound and large right liver mass 18 gauge core biopsies   Original Report Authenticated By: Judie Petit. Shick, M.D.    ASSESSMENT: this is a very pleasant 57 years old Hispanic female recently diagnosed with cholangiocarcinoma.   PLAN: I had a length discussion with the patient and her husband today about her current disease and treatment options.  I will complete the staging workup by ordering CT scan of the chest, abdomen and pelvis. Unfortunately the patient has poor prognosis. I discussed with her the option of palliative care versus systemic chemotherapy with cisplatin and gemcitabine. She is interested in treatment. I will discuss the treatment plan with the patient in more details and her upcoming visit after the staging scans. I will  also arrange for the patient to have a Spanish interpreter for all her visit in the future. I gave the patient and her husband the time to ask questions and I answered them completely to their satisfaction.  All questions were answered. The patient knows to call the clinic with any problems, questions or concerns. We can certainly see the patient much sooner if necessary.  Thank you so much for allowing me to participate in the care of  Tammy [name]. I will continue to follow up the patient with you and assist in her care.  I spent 40 minutes counseling the patient face to face. The total time spent in the appointment was 55 minutes.  Othell Diluzio K. 05/10/2013, 1:26 PM

## 2013-05-10 NOTE — Telephone Encounter (Signed)
gv adn printed appt sched and avs for pt...gv pt barium   °

## 2013-05-11 LAB — AFP TUMOR MARKER: AFP-Tumor Marker: 4.7 ng/mL (ref 0.0–8.0)

## 2013-05-11 NOTE — ED Provider Notes (Signed)
Medical screening examination/treatment/procedure(s) were performed by non-physician practitioner and as supervising physician I was immediately available for consultation/collaboration.  Toy Baker, MD 05/11/13 [phone]

## 2013-05-12 NOTE — Patient Instructions (Signed)
Your recently diagnosed with cholangiocarcinoma. I ordered CT scan of the chest, abdomen and pelvis for evaluation of her disease. Follow up visit in one week for reevaluation and detailed discussion of the treatment options.

## 2013-05-15 ENCOUNTER — Inpatient Hospital Stay (HOSPITAL_COMMUNITY): Admission: RE | Admit: 2013-05-15 | Payer: Self-pay | Source: Ambulatory Visit

## 2013-05-15 ENCOUNTER — Ambulatory Visit (HOSPITAL_COMMUNITY)
Admission: RE | Admit: 2013-05-15 | Discharge: 2013-05-15 | Disposition: A | Discharge status: Home / Self Care | Payer: Managed Care, Other (non HMO) | Source: Ambulatory Visit | Attending: Internal Medicine | Admitting: Internal Medicine

## 2013-05-15 DIAGNOSIS — R188 Other ascites: Secondary | ICD-10-CM | POA: Insufficient documentation

## 2013-05-15 DIAGNOSIS — R599 Enlarged lymph nodes, unspecified: Secondary | ICD-10-CM | POA: Insufficient documentation

## 2013-05-15 DIAGNOSIS — R1011 Right upper quadrant pain: Secondary | ICD-10-CM | POA: Insufficient documentation

## 2013-05-15 DIAGNOSIS — C221 Intrahepatic bile duct carcinoma: Secondary | ICD-10-CM | POA: Insufficient documentation

## 2013-05-15 DIAGNOSIS — M47814 Spondylosis without myelopathy or radiculopathy, thoracic region: Secondary | ICD-10-CM | POA: Insufficient documentation

## 2013-05-15 MED ORDER — IOHEXOL 300 MG/ML  SOLN
100.0000 mL | Freq: Once | INTRAMUSCULAR | Status: AC | PRN
  Administered 2013-05-15: 100 mL via INTRAVENOUS

## 2013-05-17 ENCOUNTER — Telehealth: Payer: Self-pay | Admitting: Internal Medicine

## 2013-05-17 ENCOUNTER — Ambulatory Visit (HOSPITAL_BASED_OUTPATIENT_CLINIC_OR_DEPARTMENT_OTHER): Payer: Managed Care, Other (non HMO) | Admitting: Internal Medicine

## 2013-05-17 ENCOUNTER — Encounter: Payer: Self-pay | Admitting: Internal Medicine

## 2013-05-17 VITALS — BP 161/104 | HR 120 | Temp 97.1°F | Resp 18 | Ht 60.0 in | Wt 117.7 lb

## 2013-05-17 DIAGNOSIS — C221 Intrahepatic bile duct carcinoma: Secondary | ICD-10-CM

## 2013-05-17 NOTE — Telephone Encounter (Signed)
Gave pt appt for June and July 2014 lab,ML and chemo

## 2013-05-17 NOTE — Progress Notes (Signed)
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  CURRENT THERAPY:ssystemic chemotherapy with cisplatin 100 mg/M2 on day 1 and gemcitabine 800 mg/M2 on days 1 and 8 every 3 weeks. First cycle expected on 05/23/2013  INTERVAL HISTORY: Tammy [name] 57 y.o. female returns to the clinic today for follow up visit accompanied by her husband, family member as well as her Spanish interpreter. The patient is feeling fine today except for the pain in the epigastric area that comes and goes and gets better after meals. She denied having any significant nausea or vomiting. She has no fever or chills. The patient denied having any significant chest pain, shortness breath, cough or hemoptysis. She had a recent CT scan of the chest, abdomen and pelvis performed and she is here for evaluation and discussion of her scan results and treatment options.  MEDICAL HISTORY: Past Medical History  Diagnosis Date  . Arthritis   . Polyp of colon 11/14/2008    seen on colonoscopy, f/u colonoscopy recommended in 5 years.     ALLERGIES:  is allergic to tramadol.  MEDICATIONS:  Current Outpatient Prescriptions  Medication Sig Dispense Refill  . amLODipine (NORVASC) 5 MG tablet Take 5 mg by mouth daily.       No current facility-administered medications for this visit.    SURGICAL HISTORY:  Past Surgical History  Procedure Laterality Date  . Abdominal hysterectomy  01/24/2007    including cervix, benign    REVIEW OF SYSTEMS:  A comprehensive review of systems was negative except for: Gastrointestinal: positive for abdominal pain   PHYSICAL EXAMINATION: General appearance: alert, cooperative and no distress Head: Normocephalic, without obvious abnormality, atraumatic Neck: no adenopathy Lymph  nodes: Cervical, supraclavicular, and axillary nodes normal. Resp: clear to auscultation bilaterally Back: symmetric, no curvature. ROM normal. No CVA tenderness. Cardio: regular rate and rhythm, S1, S2 normal, no murmur, click, rub or gallop GI: soft, non-tender; bowel sounds normal; no masses,  no organomegaly Extremities: extremities normal, atraumatic, no cyanosis or edema Neurologic: Alert and oriented X 3, normal strength and tone. Normal symmetric reflexes. Normal coordination and gait  ECOG PERFORMANCE STATUS: 1 - Symptomatic but completely ambulatory  Blood pressure 161/104, pulse 120, temperature 97.1 F (36.2 C), temperature source Oral, resp. rate 18, height 5' (1.524 m), weight 117 lb 11.2 oz (53.388 kg).  LABORATORY DATA: Lab Results  Component Value Date   WBC 10.3 05/10/2013   HGB 14.4 05/10/2013   HCT 41.2 05/10/2013   MCV 85.2 05/10/2013   PLT 244 05/10/2013      Chemistry      Component Value Date/Time   NA 135* 05/10/2013 1249   NA 135 02/20/2013 1640   K 4.0 05/10/2013 1249   K 3.9 02/20/2013 1640   CL 95* 05/10/2013 1249   CL 95* 02/20/2013 1640   CO2 28 05/10/2013 1249   CO2 31 02/20/2013 1640   BUN 7.8 05/10/2013 1249   BUN 12 02/20/2013 1640   CREATININE 0.6 05/10/2013 1249   CREATININE 0.39* 02/20/2013 1640   CREATININE 0.54 06/24/2010 1951      Component Value Date/Time   CALCIUM 10.4 05/10/2013 1249   CALCIUM 10.1 02/20/2013 1640   ALKPHOS 197* 05/10/2013 1249   ALKPHOS 155* 02/20/2013 1640   AST 32 05/10/2013 1249   AST  29 02/20/2013 1640   ALT 12 05/10/2013 1249   ALT 17 02/20/2013 1640   BILITOT 0.53 05/10/2013 1249   BILITOT 0.5 02/20/2013 1640       RADIOGRAPHIC STUDIES: Ct Chest W Contrast  05/15/2013   *RADIOLOGY REPORT*  Clinical Data:  Cholangiocarcinoma.  Right upper quadrant abdominal pain.  CT CHEST, ABDOMEN AND PELVIS WITH CONTRAST  Technique:  Multidetector CT imaging of the chest, abdomen and pelvis was performed following the standard protocol during bolus  administration of intravenous contrast.  Contrast: OMNIPAQUE IOHEXOL 300 MG/ML  SOLN  Comparison:  11/17/2006 CT scan  CT CHEST  Findings:  No pathologic thoracic adenopathy observed.  Suspected filling defect in the right posterior basal segmental pulmonary artery, image 37 of series 603, suspicious for pulmonary embolus. No worrisome pulmonary nodule observed.  Mild thoracic spondylosis. Tiny sclerotic lesion in the T7 vertebral body centrally is likely benign.  IMPRESSION:  1.  Small suspected pulmonary embolus in the right posterior basal segment pulmonary artery.  Please note that today's exam was not performed with pulmonary embolus protocol and accordingly has reduced sensitivity and specificity compared to dedicated CT angiography.  CT ABDOMEN AND PELVIS  Findings:  The dominant mass spanning segments 4A, 4B, 5, and 8 of the liver measures 12.6 x 7.9 cm on image 51 of series 2. Scattered smaller masses are present in all segments of the liver. Multiple abnormal gastrohepatic, peripancreatic, and porta hepatis lymph nodes are present along with pathologic retroperitoneal adenopathy.  An index lymph node just above the left renal vein has a short axis diameter of 1.2 cm, image 63 of series 2.  The second left retroperitoneal lymph node at the level of the inferior mesenteric artery takeoff has a short axis diameter 1.0 cm. Retroperitoneal stranding noted.  The kidneys appear unremarkable, as do the proximal ureters.  Gallbladder contracted and poorly seen.  Mixed density in the iliac veins may relate to contrast timing rather than thrombus.  Appendix unremarkable.  No dilated bowel.  There is a small but abnormal amount of pelvic ascites.  Urinary bladder empty.  Uterus absent.  There is mild nodularity in the omentum.  IMPRESSION:  1.  Large central hepatic mass with numerous scattered smaller masses throughout all segments of the liver.  Appearance compatible with reported cholangiocarcinoma. 2.   Retroperitoneal, porta hepatis, gastrohepatic ligament, and peripancreatic adenopathy with mild mesenteric adenopathy, favoring metastatic disease. 3.  Poorly seen gallbladder, contracted. 4.  Pelvic ascites.  Mild nodularity of the omentum could represent early metastatic deposits.   Original Report Authenticated By: Gaylyn Rong, M.D.   Ct Abdomen Pelvis W Contrast  05/15/2013   *RADIOLOGY REPORT*  Clinical Data:  Cholangiocarcinoma.  Right upper quadrant abdominal pain.  CT CHEST, ABDOMEN AND PELVIS WITH CONTRAST  Technique:  Multidetector CT imaging of the chest, abdomen and pelvis was performed following the standard protocol during bolus administration of intravenous contrast.  Contrast: OMNIPAQUE IOHEXOL 300 MG/ML  SOLN  Comparison:  11/17/2006 CT scan  CT CHEST  Findings:  No pathologic thoracic adenopathy observed.  Suspected filling defect in the right posterior basal segmental pulmonary artery, image 37 of series 603, suspicious for pulmonary embolus. No worrisome pulmonary nodule observed.  Mild thoracic spondylosis. Tiny sclerotic lesion in the T7 vertebral body centrally is likely benign.  IMPRESSION:  1.  Small suspected pulmonary embolus in the right posterior basal segment pulmonary artery.  Please note that today's exam was not performed with pulmonary embolus protocol  and accordingly has reduced sensitivity and specificity compared to dedicated CT angiography.  CT ABDOMEN AND PELVIS  Findings:  The dominant mass spanning segments 4A, 4B, 5, and 8 of the liver measures 12.6 x 7.9 cm on image 51 of series 2. Scattered smaller masses are present in all segments of the liver. Multiple abnormal gastrohepatic, peripancreatic, and porta hepatis lymph nodes are present along with pathologic retroperitoneal adenopathy.  An index lymph node just above the left renal vein has a short axis diameter of 1.2 cm, image 63 of series 2.  The second left retroperitoneal lymph node at the level of the  inferior mesenteric artery takeoff has a short axis diameter 1.0 cm. Retroperitoneal stranding noted.  The kidneys appear unremarkable, as do the proximal ureters.  Gallbladder contracted and poorly seen.  Mixed density in the iliac veins may relate to contrast timing rather than thrombus.  Appendix unremarkable.  No dilated bowel.  There is a small but abnormal amount of pelvic ascites.  Urinary bladder empty.  Uterus absent.  There is mild nodularity in the omentum.  IMPRESSION:  1.  Large central hepatic mass with numerous scattered smaller masses throughout all segments of the liver.  Appearance compatible with reported cholangiocarcinoma. 2.  Retroperitoneal, porta hepatis, gastrohepatic ligament, and peripancreatic adenopathy with mild mesenteric adenopathy, favoring metastatic disease. 3.  Poorly seen gallbladder, contracted. 4.  Pelvic ascites.  Mild nodularity of the omentum could represent early metastatic deposits.   Original Report Authenticated By: Gaylyn Rong, M.D.   US Biopsy  05/01/2013   *RADIOLOGY REPORT*  Clinical Data: Large solid liver mass, right upper quadrant abdominal pain  ULTRASOUND RIGHT LIVER MASS 18 GAUGE CORE BIOPSY  Date:  05/01/2013 13:00:00  Radiologist:  M. Ruel Favors, M.D.  Medications:  2 mg Versed, 100 mcg Fentanyl  Guidance:  Ultrasound  Fluoroscopy time:  None.  Sedation time:  15 minutes  Contrast volume:  None.  Complications:  None.  PROCEDURE/FINDINGS:  Informed consent was obtained from the patient following explanation of the procedure, risks, benefits and alternatives. The patient understands, agrees and consents for the procedure. All questions were addressed.  A time out was performed.  Maximal barrier sterile technique utilized including caps, mask, sterile gowns, sterile gloves, large sterile drape, hand hygiene, and betadine  Previous imaging reviewed.  Preliminary ultrasound performed.  The large right hepatic mass was visualized through a right  intercostal space in the mid axillary line.  Under sterile conditions and local anesthesia, a 17 gauge 6.8 cm access needle was advanced into the mass under direct ultrasound.  Images obtained for documentation. Three core biopsies obtained.  These were intact and non fragmented.  Samples placed in formalin.  Needles removed.  Post procedure imaging demonstrates no hemorrhage or hematoma.  The patient tolerated biopsy well.  IMPRESSION: Successful ultrasound and large right liver mass 18 gauge core biopsies   Original Report Authenticated By: Judie Petit. Shick, M.D.    ASSESSMENT AND PLAN: this is a very pleasant 57 years old Hispanic female recently diagnosed with metastatic cholangiocarcinoma with large liver metastases as well as abdominal lymphadenopathy. I had a lengthy discussion today with the patient and her family about her current disease status, prognosis and treatment options. I recommended for the patient's systemic chemotherapy with cisplatin 100 mg/M2on day 1 in addition to gemcitabine 800 mg/M2 on days 1 and 8 every 3 weeks. I discussed with the patient adverse effect of this treatment including but not limited to alopecia, myelosuppression, nausea and  vomiting, peripheral neuropathy, liver or in dysfunction as well as hearing deficit. I also gave the patient the option of referral for a second opinion at any of the tertiary center in [place], but she declined this option at this point. She would like to proceed with the chemotherapy as scheduled. I will arrange for the patient to have a Port-A-Cath placed by interventional radiology before the first cycle of her chemotherapy.  I will call her pharmacy with prescription for Compazine 10 mg by mouth every 6 hours as needed for nausea in addition to Emla Cream to be applied to the Port-A-Cath site before chemotherapy. She is expected to start the first cycle of this treatment next week. She would come back for follow up visit in 2 weeks for  evaluation and management any adverse effect of her chemotherapy I gave the patient and her family the time to ask questions and I answered them completely to their satisfactions. All questions were answered. The patient knows to call the clinic with any problems, questions or concerns. We can certainly see the patient much sooner if necessary.  I spent counseling the patient face to face. The total time spent in the appointment was 45 minutes.

## 2013-05-18 ENCOUNTER — Other Ambulatory Visit: Payer: Self-pay | Admitting: Oncology

## 2013-05-18 ENCOUNTER — Encounter (HOSPITAL_COMMUNITY): Payer: Self-pay | Admitting: Pharmacy Technician

## 2013-05-18 ENCOUNTER — Telehealth: Payer: Self-pay | Admitting: Medical Oncology

## 2013-05-18 NOTE — Telephone Encounter (Signed)
Port will be scheduled prior to second chemo treatment.

## 2013-05-20 NOTE — Patient Instructions (Addendum)
Your recent scan showed evidence for metastatic cholangiocarcinoma. We discussed treatment options including systemic chemotherapy with cisplatin and gemcitabine. First cycle next week. Follow up visit in 2 weeks

## 2013-05-21 ENCOUNTER — Other Ambulatory Visit: Payer: Self-pay | Admitting: Radiology

## 2013-05-21 ENCOUNTER — Other Ambulatory Visit: Payer: Self-pay | Admitting: *Deleted

## 2013-05-21 ENCOUNTER — Other Ambulatory Visit: Payer: Managed Care, Other (non HMO)

## 2013-05-21 DIAGNOSIS — C221 Intrahepatic bile duct carcinoma: Secondary | ICD-10-CM

## 2013-05-21 MED ORDER — LIDOCAINE-PRILOCAINE 2.5-2.5 % EX CREA: TOPICAL_CREAM | CUTANEOUS | Status: AC | PRN

## 2013-05-21 MED ORDER — PROCHLORPERAZINE MALEATE 10 MG PO TABS: 10.0000 mg | ORAL_TABLET | Freq: Four times a day (QID) | ORAL | Status: DC | PRN

## 2013-05-23 ENCOUNTER — Other Ambulatory Visit (HOSPITAL_BASED_OUTPATIENT_CLINIC_OR_DEPARTMENT_OTHER): Payer: Managed Care, Other (non HMO) | Admitting: Lab

## 2013-05-23 ENCOUNTER — Ambulatory Visit (HOSPITAL_BASED_OUTPATIENT_CLINIC_OR_DEPARTMENT_OTHER): Payer: Managed Care, Other (non HMO)

## 2013-05-23 VITALS — BP 148/89 | HR 104 | Temp 97.6°F | Resp 20

## 2013-05-23 DIAGNOSIS — C221 Intrahepatic bile duct carcinoma: Secondary | ICD-10-CM

## 2013-05-23 DIAGNOSIS — C787 Secondary malignant neoplasm of liver and intrahepatic bile duct: Secondary | ICD-10-CM

## 2013-05-23 DIAGNOSIS — Z5111 Encounter for antineoplastic chemotherapy: Secondary | ICD-10-CM

## 2013-05-23 LAB — CBC WITH DIFFERENTIAL/PLATELET
BASO%: 0.2 % (ref 0.0–2.0)
EOS%: 0.3 % (ref 0.0–7.0)
HCT: 40.8 % (ref 34.8–46.6)
MCH: 29.8 pg (ref 25.1–34.0)
MCHC: 34.8 g/dL (ref 31.5–36.0)
NEUT%: 81.5 % — ABNORMAL HIGH (ref 38.4–76.8)
RDW: 13.9 % (ref 11.2–14.5)
lymph#: 1 10*3/uL (ref 0.9–3.3)

## 2013-05-23 LAB — COMPREHENSIVE METABOLIC PANEL (CC13)
ALT: 15 U/L (ref 0–55)
AST: 38 U/L — ABNORMAL HIGH (ref 5–34)
Albumin: 3.6 g/dL (ref 3.5–5.0)
BUN: 9.3 mg/dL (ref 7.0–26.0)
CO2: 27 mEq/L (ref 22–29)
Calcium: 9.8 mg/dL (ref 8.4–10.4)
Chloride: 95 mEq/L — ABNORMAL LOW (ref 98–109)
Creatinine: 0.6 mg/dL (ref 0.6–1.1)
Potassium: 3.9 mEq/L (ref 3.5–5.1)

## 2013-05-23 MED ORDER — SODIUM CHLORIDE 0.9 % IV SOLN
100.0000 mg/m2 | Freq: Once | INTRAVENOUS | Status: AC
  Administered 2013-05-23: 150 mg via INTRAVENOUS
  Filled 2013-05-23: qty 150

## 2013-05-23 MED ORDER — SODIUM CHLORIDE 0.9 % IV SOLN
800.0000 mg/m2 | Freq: Once | INTRAVENOUS | Status: AC
  Administered 2013-05-23: 1216 mg via INTRAVENOUS
  Filled 2013-05-23: qty 32

## 2013-05-23 MED ORDER — PALONOSETRON HCL INJECTION 0.25 MG/5ML
0.2500 mg | Freq: Once | INTRAVENOUS | Status: AC
  Administered 2013-05-23: 0.25 mg via INTRAVENOUS

## 2013-05-23 MED ORDER — SODIUM CHLORIDE 0.9 % IV SOLN
INTRAVENOUS | Status: DC
  Administered 2013-05-23: 09:00:00 via INTRAVENOUS

## 2013-05-23 MED ORDER — DEXAMETHASONE SODIUM PHOSPHATE 20 MG/5ML IJ SOLN
12.0000 mg | Freq: Once | INTRAMUSCULAR | Status: AC
  Administered 2013-05-23: 12 mg via INTRAVENOUS

## 2013-05-23 MED ORDER — FOSAPREPITANT DIMEGLUMINE INJECTION 150 MG
150.0000 mg | Freq: Once | INTRAVENOUS | Status: AC
  Administered 2013-05-23: 150 mg via INTRAVENOUS
  Filled 2013-05-23: qty 5

## 2013-05-23 MED ORDER — POTASSIUM CHLORIDE 2 MEQ/ML IV SOLN
Freq: Once | INTRAVENOUS | Status: AC
  Administered 2013-05-23: 10:00:00 via INTRAVENOUS
  Filled 2013-05-23: qty 10

## 2013-05-23 NOTE — Patient Instructions (Addendum)
[facility] Discharge Instructions for Patients Receiving Chemotherapy  Today you received the following chemotherapy agents: Gemzar and Cisplatin.  To help prevent nausea and vomiting after your treatment, we encourage you to take your nausea medication as prescribed.   If you develop nausea and vomiting that is not controlled by your nausea medication, call the clinic.   BELOW ARE SYMPTOMS THAT SHOULD BE REPORTED IMMEDIATELY:  *FEVER GREATER THAN 100.5 F  *CHILLS WITH OR WITHOUT FEVER  NAUSEA AND VOMITING THAT IS NOT CONTROLLED WITH YOUR NAUSEA MEDICATION  *UNUSUAL SHORTNESS OF BREATH  *UNUSUAL BRUISING OR BLEEDING  TENDERNESS IN MOUTH AND THROAT WITH OR WITHOUT PRESENCE OF ULCERS  *URINARY PROBLEMS  *BOWEL PROBLEMS  UNUSUAL RASH Items with * indicate a potential emergency and should be followed up as soon as possible.  Feel free to call the clinic you have any questions or concerns. The clinic phone number is [phone].    

## 2013-05-23 NOTE — Progress Notes (Signed)
Pre Cisplatin void was 750 mls and post Cisplatin void was 650 mls.

## 2013-05-24 ENCOUNTER — Ambulatory Visit (HOSPITAL_COMMUNITY)
Admission: RE | Admit: 2013-05-24 | Discharge: 2013-05-24 | Disposition: A | Discharge status: Home / Self Care | Payer: Managed Care, Other (non HMO) | Source: Ambulatory Visit | Attending: Internal Medicine | Admitting: Internal Medicine

## 2013-05-24 ENCOUNTER — Encounter (HOSPITAL_COMMUNITY): Payer: Self-pay

## 2013-05-24 ENCOUNTER — Telehealth: Payer: Self-pay | Admitting: *Deleted

## 2013-05-24 ENCOUNTER — Other Ambulatory Visit: Payer: Self-pay | Admitting: Internal Medicine

## 2013-05-24 DIAGNOSIS — C221 Intrahepatic bile duct carcinoma: Secondary | ICD-10-CM

## 2013-05-24 HISTORY — DX: Neoplasm of unspecified behavior of digestive system: D49.0

## 2013-05-24 LAB — CBC WITH DIFFERENTIAL/PLATELET
Basophils Relative: 0 % (ref 0–1)
HCT: 39.9 % (ref 36.0–46.0)
Hemoglobin: 13.9 g/dL (ref 12.0–15.0)
Lymphocytes Relative: 5 % — ABNORMAL LOW (ref 12–46)
MCHC: 34.8 g/dL (ref 30.0–36.0)
Monocytes Absolute: 0.7 10*3/uL (ref 0.1–1.0)
Monocytes Relative: 5 % (ref 3–12)
Neutro Abs: 11.8 10*3/uL — ABNORMAL HIGH (ref 1.7–7.7)
Neutrophils Relative %: 90 % — ABNORMAL HIGH (ref 43–77)
RBC: 4.71 MIL/uL (ref 3.87–5.11)
WBC: 13.1 10*3/uL — ABNORMAL HIGH (ref 4.0–10.5)

## 2013-05-24 LAB — PROTIME-INR: INR: 1.13 (ref 0.00–1.49)

## 2013-05-24 LAB — APTT: aPTT: 23 seconds — ABNORMAL LOW (ref 24–37)

## 2013-05-24 MED ORDER — FENTANYL CITRATE 0.05 MG/ML IJ SOLN
INTRAMUSCULAR | Status: AC | PRN
  Administered 2013-05-24: 100 ug via INTRAVENOUS

## 2013-05-24 MED ORDER — CEFAZOLIN SODIUM-DEXTROSE 2-3 GM-% IV SOLR
2.0000 g | Freq: Once | INTRAVENOUS | Status: AC
  Administered 2013-05-24: 2 g via INTRAVENOUS
  Filled 2013-05-24: qty 50

## 2013-05-24 MED ORDER — MIDAZOLAM HCL 2 MG/2ML IJ SOLN
INTRAMUSCULAR | Status: AC
Start: 2013-05-24 — End: 2013-05-24
  Filled 2013-05-24: qty 4

## 2013-05-24 MED ORDER — FENTANYL CITRATE 0.05 MG/ML IJ SOLN
INTRAMUSCULAR | Status: AC
  Filled 2013-05-24: qty 4

## 2013-05-24 MED ORDER — LIDOCAINE HCL 1 % IJ SOLN
INTRAMUSCULAR | Status: AC
  Filled 2013-05-24: qty 20

## 2013-05-24 MED ORDER — HEPARIN SOD (PORK) LOCK FLUSH 100 UNIT/ML IV SOLN: 500.0000 [IU] | Freq: Once | INTRAVENOUS | Status: DC

## 2013-05-24 MED ORDER — MIDAZOLAM HCL 2 MG/2ML IJ SOLN
INTRAMUSCULAR | Status: AC | PRN
  Administered 2013-05-24: 2 mg via INTRAVENOUS

## 2013-05-24 MED ORDER — SODIUM CHLORIDE 0.9 % IV SOLN
INTRAVENOUS | Status: DC
  Administered 2013-05-24: 08:00:00 via INTRAVENOUS

## 2013-05-24 NOTE — Telephone Encounter (Signed)
Called [facility] at home number(s).  Messge left requesting a return call for chemotherapy follow up.  Awaiting return call from patient.

## 2013-05-24 NOTE — H&P (Signed)
Tammy [name] is an 57 y.o. female.   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 Arthritis   . Polyp of colon 11/14/2008    seen on colonoscopy, f/u colonoscopy recommended in 5 years.   . Tumor liver 05/01/13    Past Surgical History  Procedure Laterality Date  . Abdominal hysterectomy  01/24/2007    including cervix, benign    No family history on file. Social History:  reports that she has never smoked. She has never used smokeless tobacco. She reports that she does not drink alcohol or use illicit drugs.  Allergies:  Allergies  Allergen Reactions  . Tramadol Nausea And Vomiting    headache     (Not in a hospital admission)  Results for orders placed during the hospital encounter of 05/24/13 (from the past 48 hour(s))  APTT     Status: Abnormal   Collection Time    05/24/13  8:10 AM      Result Value Range   aPTT 23 (*) 24 - 37 seconds  CBC WITH DIFFERENTIAL     Status: Abnormal   Collection Time    05/24/13  8:10 AM      Result Value Range   WBC 13.1 (*) 4.0 - 10.5 K/uL   RBC 4.71  3.87 - 5.11 MIL/uL   Hemoglobin 13.9  12.0 - 15.0 g/dL   HCT 69.6  29.5 - 28.4 %   MCV 84.7  78.0 - 100.0 fL   MCH 29.5  26.0 - 34.0 pg   MCHC 34.8  30.0 - 36.0 g/dL   RDW 13.2  44.0 - 10.2 %   Platelets 251  150 - 400 K/uL   Neutrophils Relative % 90 (*) 43 - 77 %   Neutro Abs 11.8 (*) 1.7 - 7.7 K/uL   Lymphocytes Relative 5 (*) 12 - 46 %   Lymphs Abs 0.6 (*) 0.7 - 4.0 K/uL   Monocytes Relative 5  3 - 12 %   Monocytes Absolute 0.7  0.1 - 1.0 K/uL   Eosinophils Relative 0  0 - 5 %   Eosinophils Absolute 0.0  0.0 - 0.7 K/uL   Basophils Relative 0  0 - 1 %   Basophils Absolute 0.0  0.0 - 0.1 K/uL  PROTIME-INR     Status: None   Collection Time    05/24/13   8:10 AM      Result Value Range   Prothrombin Time 14.3  11.6 - 15.2 seconds   INR 1.13  0.00 - 1.49   No results found.  Review of Systems  Constitutional: Positive for weight loss. Negative for fever.  Respiratory: Negative for sputum production.   Cardiovascular: Negative for chest pain.  Gastrointestinal: Positive for nausea and abdominal pain. Negative for vomiting.  Neurological: Negative for headaches.    Blood pressure 145/58, pulse 100, temperature 97.9 F (36.6 C), temperature source Oral, resp. rate 18, height 5' (1.524 m), weight 117 lb (53.071 kg), SpO2 99.00%. Physical Exam  Constitutional: She is oriented to person, place, and time. She appears well-developed.  Spanish speaking- understands English fairly well  Cardiovascular: Normal rate, regular rhythm and normal heart sounds.   No murmur heard. Respiratory: Effort normal and breath sounds normal. She has no wheezes.  GI: Soft. Bowel sounds are normal.  There is no tenderness.  Musculoskeletal: Normal range of motion.  Neurological: She is alert and oriented to person, place, and time.  Skin: Skin is warm and dry.  Psychiatric: She has a normal mood and affect. Her behavior is normal. Judgment and thought content normal.     Assessment/Plan Abd pain; wt loss Bx 04/2013- +adenocarcinoma Now scheduled for PAC placement Chemo to begin next week Pt and husband aware of procedure benefits and risks and agreeable to proceed Consent signed using Spanish interpreter Consent in chart  Jaedin Regina A 05/24/2013, 9:07 AM

## 2013-05-24 NOTE — Procedures (Signed)
Interventional Radiology Procedure Note  Procedure: Placement of a right IJ approach single lumen PowerPort.  Tip is positioned at the superior cavoatrial junction and catheter is ready for immediate use.  Complications: No immediate Recommendations:  - Ok to shower tomorrow - Do not submerge for 7 days - Routine line care   Signed,  July Nickson K. Orange Hilligoss, MD Vascular & Interventional Radiologist [facility]  

## 2013-05-24 NOTE — H&P (Signed)
Agree with PA note.  Will place right IJ port.  Signed,  Sterling Big, MD Vascular & Interventional Radiologist [facility]

## 2013-05-24 NOTE — Telephone Encounter (Signed)
Message copied by Augusto Garbe on Thu May 24, 2013  5:08 PM ------      Message from: Lenn Sink I      Created: Wed May 23, 2013  2:24 PM      Regarding: follow up call       First time Gemzar and Cisplatin. No reaction. Dr. Arbutus Ped. Patient is getting a Port-a -Cath tomorrow morning at [facility]. Please call patient in the afternoon. Must have an interpreter to ask questions.  ------

## 2013-05-30 ENCOUNTER — Ambulatory Visit: Payer: Self-pay | Admitting: Physician Assistant

## 2013-05-30 ENCOUNTER — Telehealth: Payer: Self-pay | Admitting: Internal Medicine

## 2013-05-30 ENCOUNTER — Ambulatory Visit: Payer: Self-pay | Admitting: Internal Medicine

## 2013-05-30 ENCOUNTER — Ambulatory Visit (HOSPITAL_BASED_OUTPATIENT_CLINIC_OR_DEPARTMENT_OTHER): Payer: Managed Care, Other (non HMO) | Admitting: Internal Medicine

## 2013-05-30 ENCOUNTER — Ambulatory Visit (HOSPITAL_BASED_OUTPATIENT_CLINIC_OR_DEPARTMENT_OTHER): Payer: Managed Care, Other (non HMO)

## 2013-05-30 ENCOUNTER — Other Ambulatory Visit (HOSPITAL_BASED_OUTPATIENT_CLINIC_OR_DEPARTMENT_OTHER): Payer: Managed Care, Other (non HMO) | Admitting: Lab

## 2013-05-30 ENCOUNTER — Encounter: Payer: Self-pay | Admitting: Internal Medicine

## 2013-05-30 VITALS — BP 139/96 | HR 108 | Temp 97.0°F | Resp 18 | Ht 60.0 in | Wt 116.5 lb

## 2013-05-30 DIAGNOSIS — C787 Secondary malignant neoplasm of liver and intrahepatic bile duct: Secondary | ICD-10-CM

## 2013-05-30 DIAGNOSIS — R1011 Right upper quadrant pain: Secondary | ICD-10-CM

## 2013-05-30 DIAGNOSIS — C221 Intrahepatic bile duct carcinoma: Secondary | ICD-10-CM

## 2013-05-30 DIAGNOSIS — Z5111 Encounter for antineoplastic chemotherapy: Secondary | ICD-10-CM

## 2013-05-30 LAB — CBC WITH DIFFERENTIAL/PLATELET
BASO%: 0.2 % (ref 0.0–2.0)
Eosinophils Absolute: 0 10*3/uL (ref 0.0–0.5)
HCT: 36.2 % (ref 34.8–46.6)
MCHC: 35 g/dL (ref 31.5–36.0)
MONO#: 0.3 10*3/uL (ref 0.1–0.9)
NEUT#: 5.2 10*3/uL (ref 1.5–6.5)
NEUT%: 84.9 % — ABNORMAL HIGH (ref 38.4–76.8)
Platelets: 120 10*3/uL — ABNORMAL LOW (ref 145–400)
RBC: 4.22 10*6/uL (ref 3.70–5.45)
WBC: 6.2 10*3/uL (ref 3.9–10.3)
lymph#: 0.6 10*3/uL — ABNORMAL LOW (ref 0.9–3.3)

## 2013-05-30 LAB — COMPREHENSIVE METABOLIC PANEL (CC13)
ALT: 30 U/L (ref 0–55)
CO2: 30 mEq/L — ABNORMAL HIGH (ref 22–29)
Calcium: 9.2 mg/dL (ref 8.4–10.4)
Chloride: 91 mEq/L — ABNORMAL LOW (ref 98–109)
Glucose: 159 mg/dl — ABNORMAL HIGH (ref 70–140)
Sodium: 131 mEq/L — ABNORMAL LOW (ref 136–145)
Total Protein: 6.6 g/dL (ref 6.4–8.3)

## 2013-05-30 MED ORDER — SODIUM CHLORIDE 0.9 % IV SOLN
Freq: Once | INTRAVENOUS | Status: AC
  Administered 2013-05-30: 12:00:00 via INTRAVENOUS

## 2013-05-30 MED ORDER — SODIUM CHLORIDE 0.9 % IJ SOLN
10.0000 mL | INTRAMUSCULAR | Status: DC | PRN
  Administered 2013-05-30: 10 mL
  Filled 2013-05-30: qty 10

## 2013-05-30 MED ORDER — PROCHLORPERAZINE MALEATE 10 MG PO TABS
10.0000 mg | ORAL_TABLET | Freq: Once | ORAL | Status: AC
  Administered 2013-05-30: 10 mg via ORAL

## 2013-05-30 MED ORDER — HEPARIN SOD (PORK) LOCK FLUSH 100 UNIT/ML IV SOLN
500.0000 [IU] | Freq: Once | INTRAVENOUS | Status: AC | PRN
  Administered 2013-05-30: 500 [IU]
  Filled 2013-05-30: qty 5

## 2013-05-30 MED ORDER — GEMCITABINE HCL CHEMO INJECTION 1 GM/26.3ML
800.0000 mg/m2 | Freq: Once | INTRAVENOUS | Status: AC
  Administered 2013-05-30: 1216 mg via INTRAVENOUS
  Filled 2013-05-30: qty 32

## 2013-05-30 NOTE — Telephone Encounter (Signed)
gv pt appt schedule for July and August.  °

## 2013-05-30 NOTE — Patient Instructions (Addendum)
[facility] Discharge Instructions for Patients Receiving Chemotherapy  Today you received the following chemotherapy agents Gemzar.  To help prevent nausea and vomiting after your treatment, we encourage you to take your nausea medication as prescribed.   If you develop nausea and vomiting that is not controlled by your nausea medication, call the clinic.   BELOW ARE SYMPTOMS THAT SHOULD BE REPORTED IMMEDIATELY:  *FEVER GREATER THAN 100.5 F  *CHILLS WITH OR WITHOUT FEVER  NAUSEA AND VOMITING THAT IS NOT CONTROLLED WITH YOUR NAUSEA MEDICATION  *UNUSUAL SHORTNESS OF BREATH  *UNUSUAL BRUISING OR BLEEDING  TENDERNESS IN MOUTH AND THROAT WITH OR WITHOUT PRESENCE OF ULCERS  *URINARY PROBLEMS  *BOWEL PROBLEMS  UNUSUAL RASH Items with * indicate a potential emergency and should be followed up as soon as possible.  Feel free to call the clinic you have any questions or concerns. The clinic phone number is [phone].    

## 2013-05-30 NOTE — Telephone Encounter (Signed)
per Dr. Kerry Fort only lab on day of tx.Marland KitchenMarland KitchenMarland Kitchen

## 2013-05-30 NOTE — Progress Notes (Signed)
[facility] [phone]   [phone]  OFFICE PROGRESS NOTE  Tammy Phi, MD [address] 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   CURRENT THERAPY:ssystemic chemotherapy with cisplatin 100 mg/M2 on day 1 and gemcitabine 800 mg/M2 on days 1 and 8 every 3 weeks. First cycle expected on 05/23/2013   INTERVAL HISTORY: Tammy [name] 57 y.o. female returns to the clinic today for followup visit accompanied by her husband and her Spanish interpreter. The patient tolerated the first week of her systemic chemotherapy with cisplatin and gemcitabine fairly well with no significant adverse effects. She had a Port-A-Cath placed recently by interventional radiology. The patient denied having any significant fever or chills. She denied having any nausea or vomiting. She denied having any significant chest pain, shortness breath, cough or hemoptysis. She continues to have the right upper quadrant pain and tenderness to palpation. She is here today to start day 8 of cycle #1 of her chemotherapy.  MEDICAL HISTORY: Past Medical History  Diagnosis Date  . Arthritis   . Polyp of colon 11/14/2008    seen on colonoscopy, f/u colonoscopy recommended in 5 years.   . 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  MEDICATIONS:  Current Outpatient Prescriptions  Medication Sig Dispense Refill  . lidocaine-prilocaine (EMLA) cream Apply topically as needed. Apply to port 1 hr before chemo  30 g  0  . prochlorperazine (COMPAZINE) 10 MG tablet Take 1 tablet (10 mg total) by mouth every 6 (six) hours as needed (nausa and vomiting).  30 tablet  1   No current facility-administered medications for this visit.    SURGICAL HISTORY:  Past Surgical History  Procedure Laterality Date  . Abdominal hysterectomy  01/24/2007    including cervix, benign    REVIEW OF SYSTEMS:   A comprehensive review of systems was negative except for: Gastrointestinal: positive for abdominal pain   PHYSICAL EXAMINATION: General appearance: alert, cooperative and no distress Head: Normocephalic, without obvious abnormality, atraumatic Neck: no adenopathy Lymph nodes: Cervical, supraclavicular, and axillary nodes normal. Resp: clear to auscultation bilaterally Cardio: regular rate and rhythm, S1, S2 normal, no murmur, click, rub or gallop GI: Right upper quadrant tenderness to palpation Extremities: extremities normal, atraumatic, no cyanosis or edema Neurologic: Alert and oriented X 3, normal strength and tone. Normal symmetric reflexes. Normal coordination and gait  ECOG PERFORMANCE STATUS: 1 - Symptomatic but completely ambulatory  Blood pressure 139/96, pulse 108, temperature 97 F (36.1 C), temperature source Oral, resp. rate 18, height 5' (1.524 m), weight 116 lb 8 oz (52.844 kg).  LABORATORY DATA: Lab Results  Component Value Date   WBC 6.2 05/30/2013   HGB 12.7 05/30/2013   HCT 36.2 05/30/2013   MCV 85.7 05/30/2013   PLT 120* 05/30/2013      Chemistry      Component Value Date/Time   NA 130* 05/23/2013 0808   NA 135 02/20/2013 1640   K 3.9 05/23/2013 0808   K 3.9 02/20/2013 1640   CL 95* 05/10/2013 1249   CL 95* 02/20/2013 1640   CO2 27 05/23/2013 0808   CO2 31 02/20/2013 1640   BUN 9.3 05/23/2013 0808   BUN 12 02/20/2013 1640   CREATININE 0.6 05/23/2013 0808   CREATININE 0.39* 02/20/2013 1640   CREATININE 0.54 06/24/2010 1951      Component Value Date/Time   CALCIUM 9.8  05/23/2013 0808   CALCIUM 10.1 02/20/2013 1640   ALKPHOS 182* 05/23/2013 0808   ALKPHOS 155* 02/20/2013 1640   AST 38* 05/23/2013 0808   AST 29 02/20/2013 1640   ALT 15 05/23/2013 0808   ALT 17 02/20/2013 1640   BILITOT 0.71 05/23/2013 0808   BILITOT 0.5 02/20/2013 1640       RADIOGRAPHIC STUDIES: Ct Chest W Contrast  05/15/2013   *RADIOLOGY REPORT*  Clinical Data:  Cholangiocarcinoma.  Right upper quadrant abdominal pain.   CT CHEST, ABDOMEN AND PELVIS WITH CONTRAST  Technique:  Multidetector CT imaging of the chest, abdomen and pelvis was performed following the standard protocol during bolus administration of intravenous contrast.  Contrast: OMNIPAQUE IOHEXOL 300 MG/ML  SOLN  Comparison:  11/17/2006 CT scan  CT CHEST  Findings:  No pathologic thoracic adenopathy observed.  Suspected filling defect in the right posterior basal segmental pulmonary artery, image 37 of series 603, suspicious for pulmonary embolus. No worrisome pulmonary nodule observed.  Mild thoracic spondylosis. Tiny sclerotic lesion in the T7 vertebral body centrally is likely benign.  IMPRESSION:  1.  Small suspected pulmonary embolus in the right posterior basal segment pulmonary artery.  Please note that today's exam was not performed with pulmonary embolus protocol and accordingly has reduced sensitivity and specificity compared to dedicated CT angiography.  CT ABDOMEN AND PELVIS  Findings:  The dominant mass spanning segments 4A, 4B, 5, and 8 of the liver measures 12.6 x 7.9 cm on image 51 of series 2. Scattered smaller masses are present in all segments of the liver. Multiple abnormal gastrohepatic, peripancreatic, and porta hepatis lymph nodes are present along with pathologic retroperitoneal adenopathy.  An index lymph node just above the left renal vein has a short axis diameter of 1.2 cm, image 63 of series 2.  The second left retroperitoneal lymph node at the level of the inferior mesenteric artery takeoff has a short axis diameter 1.0 cm. Retroperitoneal stranding noted.  The kidneys appear unremarkable, as do the proximal ureters.  Gallbladder contracted and poorly seen.  Mixed density in the iliac veins may relate to contrast timing rather than thrombus.  Appendix unremarkable.  No dilated bowel.  There is a small but abnormal amount of pelvic ascites.  Urinary bladder empty.  Uterus absent.  There is mild nodularity in the omentum.  IMPRESSION:   1.  Large central hepatic mass with numerous scattered smaller masses throughout all segments of the liver.  Appearance compatible with reported cholangiocarcinoma. 2.  Retroperitoneal, porta hepatis, gastrohepatic ligament, and peripancreatic adenopathy with mild mesenteric adenopathy, favoring metastatic disease. 3.  Poorly seen gallbladder, contracted. 4.  Pelvic ascites.  Mild nodularity of the omentum could represent early metastatic deposits.   Original Report Authenticated By: Gaylyn Rong, M.D.   Ct Abdomen Pelvis W Contrast  05/15/2013   *RADIOLOGY REPORT*  Clinical Data:  Cholangiocarcinoma.  Right upper quadrant abdominal pain.  CT CHEST, ABDOMEN AND PELVIS WITH CONTRAST  Technique:  Multidetector CT imaging of the chest, abdomen and pelvis was performed following the standard protocol during bolus administration of intravenous contrast.  Contrast: OMNIPAQUE IOHEXOL 300 MG/ML  SOLN  Comparison:  11/17/2006 CT scan  CT CHEST  Findings:  No pathologic thoracic adenopathy observed.  Suspected filling defect in the right posterior basal segmental pulmonary artery, image 37 of series 603, suspicious for pulmonary embolus. No worrisome pulmonary nodule observed.  Mild thoracic spondylosis. Tiny sclerotic lesion in the T7 vertebral body centrally is likely benign.  IMPRESSION:  1.  Small suspected pulmonary embolus in the right posterior basal segment pulmonary artery.  Please note that today's exam was not performed with pulmonary embolus protocol and accordingly has reduced sensitivity and specificity compared to dedicated CT angiography.  CT ABDOMEN AND PELVIS  Findings:  The dominant mass spanning segments 4A, 4B, 5, and 8 of the liver measures 12.6 x 7.9 cm on image 51 of series 2. Scattered smaller masses are present in all segments of the liver. Multiple abnormal gastrohepatic, peripancreatic, and porta hepatis lymph nodes are present along with pathologic retroperitoneal adenopathy.  An  index lymph node just above the left renal vein has a short axis diameter of 1.2 cm, image 63 of series 2.  The second left retroperitoneal lymph node at the level of the inferior mesenteric artery takeoff has a short axis diameter 1.0 cm. Retroperitoneal stranding noted.  The kidneys appear unremarkable, as do the proximal ureters.  Gallbladder contracted and poorly seen.  Mixed density in the iliac veins may relate to contrast timing rather than thrombus.  Appendix unremarkable.  No dilated bowel.  There is a small but abnormal amount of pelvic ascites.  Urinary bladder empty.  Uterus absent.  There is mild nodularity in the omentum.  IMPRESSION:  1.  Large central hepatic mass with numerous scattered smaller masses throughout all segments of the liver.  Appearance compatible with reported cholangiocarcinoma. 2.  Retroperitoneal, porta hepatis, gastrohepatic ligament, and peripancreatic adenopathy with mild mesenteric adenopathy, favoring metastatic disease. 3.  Poorly seen gallbladder, contracted. 4.  Pelvic ascites.  Mild nodularity of the omentum could represent early metastatic deposits.   Original Report Authenticated By: Gaylyn Rong, M.D.   US Biopsy  05/01/2013   *RADIOLOGY REPORT*  Clinical Data: Large solid liver mass, right upper quadrant abdominal pain  ULTRASOUND RIGHT LIVER MASS 18 GAUGE CORE BIOPSY  Date:  05/01/2013 13:00:00  Radiologist:  M. Ruel Favors, M.D.  Medications:  2 mg Versed, 100 mcg Fentanyl  Guidance:  Ultrasound  Fluoroscopy time:  None.  Sedation time:  15 minutes  Contrast volume:  None.  Complications:  None.  PROCEDURE/FINDINGS:  Informed consent was obtained from the patient following explanation of the procedure, risks, benefits and alternatives. The patient understands, agrees and consents for the procedure. All questions were addressed.  A time out was performed.  Maximal barrier sterile technique utilized including caps, mask, sterile gowns, sterile gloves, large  sterile drape, hand hygiene, and betadine  Previous imaging reviewed.  Preliminary ultrasound performed.  The large right hepatic mass was visualized through a right intercostal space in the mid axillary line.  Under sterile conditions and local anesthesia, a 17 gauge 6.8 cm access needle was advanced into the mass under direct ultrasound.  Images obtained for documentation. Three core biopsies obtained.  These were intact and non fragmented.  Samples placed in formalin.  Needles removed.  Post procedure imaging demonstrates no hemorrhage or hematoma.  The patient tolerated biopsy well.  IMPRESSION: Successful ultrasound and large right liver mass 18 gauge core biopsies   Original Report Authenticated By: Judie Petit. Miles Costain, M.D.   Ir Fluoro Guide Cv Line Right  05/24/2013   *RADIOLOGY REPORT*  TUNNELED CENTRAL VENOUS CATHETER WITH SUBCUTANEOUS RESERVOIR (PORTACATH) PLACEMENT WITH ULTRASOUND AND FLUOROSCOPIC  GUIDANCE  Date: 05/24/2013  Clinical History: 57 year old female with cholangiocarcinoma in need of durable central venous access for chemotherapy.  Sedation: Moderate (conscious) sedation was administered during this procedure.  A total of tomg Versed and 100mg  Fentanyl  were administered intravenously.  The patient's vital signs were monitored continuously by radiology nursing throughout the course of the procedure.  Total sedation time: 25 minutes  Fluoroscopy Time: 30 seconds  Procedure:  The right neck and chest was prepped with chlorhexidine, and draped in the usual sterile fashion using maximum barrier technique (cap and mask, sterile gown, sterile gloves, large sterile sheet, hand hygiene and cutaneous antiseptic).  Antibiotic prophylaxis was provided with twog Ancef administered IV one hour prior to skin incision.  Local anesthesia was attained by infiltration with 1% lidocaine with epinephrine.  Ultrasound demonstrated patency of the right internal jugular vein, and this was documented with an image.  Under  real-time ultrasound guidance, this vein was accessed with a 21 gauge micropuncture needle and image documentation was performed.  A small dermatotomy was made at the access site with an 11 scalpel.  A 0.018" wire was advanced into the SVC and the access needle exchanged for a 45F micropuncture vascular sheath.  The 0.018" wire was then removed and a 0.035" wire advanced into the IVC.  An appropriate location for the subcutaneous reservoir was selected below the clavicle and an incision was made through the skin and underlying soft tissues.  The subcutaneous tissues were then dissected using a combination of blunt and sharp surgical technique and a pocket was formed.  A single lumen power injectable portacatheter was then tunneled through the subcutaneous tissues from the pocket to the dermatotomy and the port reservoir placed within the subcutaneous pocket.  The venous access site was then serially dilated and a peel away vascular sheath placed over the wire.  The wire was removed and the port catheter advanced into position under fluoroscopic guidance. The catheter tip is positioned in the superior cavoatrial junction. This was documented with a spot image. The portacatheter was then tested and found to flush and aspirate well.  The port was flushed with saline followed by 100 units/mL heparinized saline.  The pocket was then closed in two layers using first subdermal inverted interrupted absorbable sutures followed by a running subcuticular suture.  The epidermis was then sealed with Dermabond. The dermatotomy at the venous access site was also closed with a single inverted subdermal suture and the epidermis sealed with Dermabond.  Complications:  None.  The patient tolerated the procedure well.  IMPRESSION:  Successful placement of a right IJapproach PowerPort with ultrasound and fluoroscopic guidance.  The catheter is ready for use.  Signed,  Sterling Big, MD Vascular & Interventional Radiologist  [facility]   Original Report Authenticated By: Malachy Moan, M.D.   Ir US Guide Vasc Access Right  05/24/2013   *RADIOLOGY REPORT*  TUNNELED CENTRAL VENOUS CATHETER WITH SUBCUTANEOUS RESERVOIR (PORTACATH) PLACEMENT WITH ULTRASOUND AND FLUOROSCOPIC  GUIDANCE  Date: 05/24/2013  Clinical History: 57 year old female with cholangiocarcinoma in need of durable central venous access for chemotherapy.  Sedation: Moderate (conscious) sedation was administered during this procedure.  A total of tomg Versed and 100mg  Fentanyl were administered intravenously.  The patient's vital signs were monitored continuously by radiology nursing throughout the course of the procedure.  Total sedation time: 25 minutes  Fluoroscopy Time: 30 seconds  Procedure:  The right neck and chest was prepped with chlorhexidine, and draped in the usual sterile fashion using maximum barrier technique (cap and mask, sterile gown, sterile gloves, large sterile sheet, hand hygiene and cutaneous antiseptic).  Antibiotic prophylaxis was provided with twog Ancef administered IV one hour prior to skin incision.  Local anesthesia was  attained by infiltration with 1% lidocaine with epinephrine.  Ultrasound demonstrated patency of the right internal jugular vein, and this was documented with an image.  Under real-time ultrasound guidance, this vein was accessed with a 21 gauge micropuncture needle and image documentation was performed.  A small dermatotomy was made at the access site with an 11 scalpel.  A 0.018" wire was advanced into the SVC and the access needle exchanged for a 27F micropuncture vascular sheath.  The 0.018" wire was then removed and a 0.035" wire advanced into the IVC.  An appropriate location for the subcutaneous reservoir was selected below the clavicle and an incision was made through the skin and underlying soft tissues.  The subcutaneous tissues were then dissected using a combination of blunt and sharp surgical technique  and a pocket was formed.  A single lumen power injectable portacatheter was then tunneled through the subcutaneous tissues from the pocket to the dermatotomy and the port reservoir placed within the subcutaneous pocket.  The venous access site was then serially dilated and a peel away vascular sheath placed over the wire.  The wire was removed and the port catheter advanced into position under fluoroscopic guidance. The catheter tip is positioned in the superior cavoatrial junction. This was documented with a spot image. The portacatheter was then tested and found to flush and aspirate well.  The port was flushed with saline followed by 100 units/mL heparinized saline.  The pocket was then closed in two layers using first subdermal inverted interrupted absorbable sutures followed by a running subcuticular suture.  The epidermis was then sealed with Dermabond. The dermatotomy at the venous access site was also closed with a single inverted subdermal suture and the epidermis sealed with Dermabond.  Complications:  None.  The patient tolerated the procedure well.  IMPRESSION:  Successful placement of a right IJapproach PowerPort with ultrasound and fluoroscopic guidance.  The catheter is ready for use.  Signed,  Sterling Big, MD Vascular & Interventional Radiologist [facility]   Original Report Authenticated By: Malachy Moan, M.D.    ASSESSMENT AND PLAN: This is a very pleasant 57 years old Hispanic female diagnosed with metastatic cholangiocarcinoma and currently undergoing systemic chemotherapy with cisplatin and gemcitabine status post day 1 of the first cycle. The patient related the first week of her treatment fairly well with no significant adverse effects. I recommended for her to proceed with day 8 of cycle #1 today as scheduled. The patient had several questions today and I answered them completely to her satisfaction. She would come back for followup visit in 2 weeks for  reevaluation before starting cycle #2. She was advised to call immediately if she has any concerning symptoms in the interval.  All questions were answered. The patient knows to call the clinic with any problems, questions or concerns. We can certainly see the patient much sooner if necessary.  I spent 15 minutes counseling the patient face to face. The total time spent in the appointment was 25 minutes.

## 2013-05-30 NOTE — Patient Instructions (Addendum)
Continue chemotherapy today as scheduled.  Follow up visit in 2 weeks. 

## 2013-06-01 ENCOUNTER — Encounter: Payer: Self-pay | Admitting: *Deleted

## 2013-06-01 NOTE — Progress Notes (Signed)
Patient screened by [employer].  Patient scored a 3 on thermometer.   CSW currently working with patient/family to assist with applying for short term disability through employer and potentially SSDI.   Kathrin Penner, MSW, LCSW Clinical Social Worker [facility] [phone]

## 2013-06-04 ENCOUNTER — Encounter: Payer: Self-pay | Admitting: Internal Medicine

## 2013-06-04 NOTE — Progress Notes (Signed)
Received note from [employer]. Patient was approved for Emend 05/22/13-11/22/13) and Aloxi(05/22/13-11/22/13). I will send to medical records and billing.

## 2013-06-06 ENCOUNTER — Other Ambulatory Visit: Payer: Self-pay

## 2013-06-13 ENCOUNTER — Ambulatory Visit: Payer: Self-pay

## 2013-06-13 ENCOUNTER — Ambulatory Visit (HOSPITAL_BASED_OUTPATIENT_CLINIC_OR_DEPARTMENT_OTHER): Payer: Managed Care, Other (non HMO) | Admitting: Internal Medicine

## 2013-06-13 ENCOUNTER — Telehealth: Payer: Self-pay | Admitting: Internal Medicine

## 2013-06-13 ENCOUNTER — Telehealth: Payer: Self-pay | Admitting: *Deleted

## 2013-06-13 ENCOUNTER — Encounter: Payer: Self-pay | Admitting: Internal Medicine

## 2013-06-13 ENCOUNTER — Ambulatory Visit (HOSPITAL_BASED_OUTPATIENT_CLINIC_OR_DEPARTMENT_OTHER): Payer: Managed Care, Other (non HMO)

## 2013-06-13 ENCOUNTER — Other Ambulatory Visit (HOSPITAL_BASED_OUTPATIENT_CLINIC_OR_DEPARTMENT_OTHER): Payer: Managed Care, Other (non HMO) | Admitting: Lab

## 2013-06-13 VITALS — BP 156/87 | HR 119 | Temp 98.1°F | Resp 19 | Ht 61.0 in | Wt 115.7 lb

## 2013-06-13 DIAGNOSIS — C221 Intrahepatic bile duct carcinoma: Secondary | ICD-10-CM

## 2013-06-13 DIAGNOSIS — Z5111 Encounter for antineoplastic chemotherapy: Secondary | ICD-10-CM

## 2013-06-13 DIAGNOSIS — C787 Secondary malignant neoplasm of liver and intrahepatic bile duct: Secondary | ICD-10-CM

## 2013-06-13 LAB — COMPREHENSIVE METABOLIC PANEL (CC13)
AST: 25 U/L (ref 5–34)
Albumin: 3.6 g/dL (ref 3.5–5.0)
BUN: 9.4 mg/dL (ref 7.0–26.0)
Calcium: 9.5 mg/dL (ref 8.4–10.4)
Chloride: 99 mEq/L (ref 98–109)
Potassium: 4.3 mEq/L (ref 3.5–5.1)
Sodium: 134 mEq/L — ABNORMAL LOW (ref 136–145)
Total Protein: 6.7 g/dL (ref 6.4–8.3)

## 2013-06-13 LAB — CBC WITH DIFFERENTIAL/PLATELET
BASO%: 0.4 % (ref 0.0–2.0)
Eosinophils Absolute: 0 10*3/uL (ref 0.0–0.5)
LYMPH%: 24.2 % (ref 14.0–49.7)
MCHC: 34.5 g/dL (ref 31.5–36.0)
MONO#: 0.7 10*3/uL (ref 0.1–0.9)
NEUT#: 2.6 10*3/uL (ref 1.5–6.5)
RBC: 4.15 10*6/uL (ref 3.70–5.45)
RDW: 14.7 % — ABNORMAL HIGH (ref 11.2–14.5)
WBC: 4.5 10*3/uL (ref 3.9–10.3)
lymph#: 1.1 10*3/uL (ref 0.9–3.3)
nRBC: 0 % (ref 0–0)

## 2013-06-13 MED ORDER — CISPLATIN CHEMO INJECTION 100MG/100ML
100.0000 mg/m2 | Freq: Once | INTRAVENOUS | Status: AC
  Administered 2013-06-13: 150 mg via INTRAVENOUS
  Filled 2013-06-13: qty 150

## 2013-06-13 MED ORDER — POTASSIUM CHLORIDE 2 MEQ/ML IV SOLN
Freq: Once | INTRAVENOUS | Status: AC
  Administered 2013-06-13: 10:00:00 via INTRAVENOUS
  Filled 2013-06-13: qty 10

## 2013-06-13 MED ORDER — HEPARIN SOD (PORK) LOCK FLUSH 100 UNIT/ML IV SOLN
500.0000 [IU] | Freq: Once | INTRAVENOUS | Status: AC | PRN
  Administered 2013-06-13: 500 [IU]
  Filled 2013-06-13: qty 5

## 2013-06-13 MED ORDER — DEXAMETHASONE SODIUM PHOSPHATE 20 MG/5ML IJ SOLN
12.0000 mg | Freq: Once | INTRAMUSCULAR | Status: AC
  Administered 2013-06-13: 12 mg via INTRAVENOUS

## 2013-06-13 MED ORDER — SODIUM CHLORIDE 0.9 % IV SOLN
150.0000 mg | Freq: Once | INTRAVENOUS | Status: AC
  Administered 2013-06-13: 150 mg via INTRAVENOUS
  Filled 2013-06-13: qty 5

## 2013-06-13 MED ORDER — SODIUM CHLORIDE 0.9 % IV SOLN
800.0000 mg/m2 | Freq: Once | INTRAVENOUS | Status: AC
  Administered 2013-06-13: 1216 mg via INTRAVENOUS
  Filled 2013-06-13: qty 32

## 2013-06-13 MED ORDER — SODIUM CHLORIDE 0.9 % IJ SOLN
10.0000 mL | INTRAMUSCULAR | Status: DC | PRN
  Administered 2013-06-13: 10 mL
  Filled 2013-06-13: qty 10

## 2013-06-13 MED ORDER — PALONOSETRON HCL INJECTION 0.25 MG/5ML
0.2500 mg | Freq: Once | INTRAVENOUS | Status: AC
  Administered 2013-06-13: 0.25 mg via INTRAVENOUS

## 2013-06-13 MED ORDER — SODIUM CHLORIDE 0.9 % IV SOLN
INTRAVENOUS | Status: DC
  Administered 2013-06-13: 13:00:00 via INTRAVENOUS

## 2013-06-13 NOTE — Progress Notes (Signed)
Pre hydration UOP 1200

## 2013-06-13 NOTE — Patient Instructions (Signed)
Continue chemotherapy today as scheduled. Follow up visit in 3 weeks 

## 2013-06-13 NOTE — Progress Notes (Signed)
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   CURRENT THERAPY:ssystemic chemotherapy with cisplatin 100 mg/M2 on day 1 and gemcitabine 800 mg/M2 on days 1 and 8 every 3 weeks. First cycle on 05/23/2013   INTERVAL HISTORY: Tammy [name] 57 y.o. female returns to the clinic today for followup visit accompanied by her husband and her Spanish interpreter. The patient related the first cycle of her systemic treatment with cisplatin and gemcitabine fairly well with no significant adverse effects. She denied having any significant fatigue weakness. She denied having any nausea or vomiting. She has no fever or chills. The patient denied having any significant chest pain, shortness of breath, cough or hemoptysis. She has no peripheral neuropathy or hearing issues. She is here today to start cycle #2 of her systemic therapy.  MEDICAL HISTORY: Past Medical History  Diagnosis Date  . Arthritis   . Polyp of colon 11/14/2008    seen on colonoscopy, f/u colonoscopy recommended in 5 years.   . 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  MEDICATIONS:  Current Outpatient Prescriptions  Medication Sig Dispense Refill  . lidocaine-prilocaine (EMLA) cream Apply topically as needed. Apply to port 1 hr before chemo  30 g  0  . prochlorperazine (COMPAZINE) 10 MG tablet Take 1 tablet (10 mg total) by mouth every 6 (six) hours as needed (nausa and vomiting).  30 tablet  1   No current facility-administered medications for this visit.    SURGICAL HISTORY:  Past Surgical History  Procedure Laterality Date  . Abdominal hysterectomy  01/24/2007    including cervix, benign    REVIEW OF SYSTEMS:  A comprehensive review of systems was negative.   PHYSICAL EXAMINATION:  General appearance: alert, cooperative and no distress Head: Normocephalic, without obvious abnormality, atraumatic Neck: no adenopathy Lymph nodes: Cervical, supraclavicular, and axillary nodes normal. Resp: clear to auscultation bilaterally Cardio: regular rate and rhythm, S1, S2 normal, no murmur, click, rub or gallop GI: soft, non-tender; bowel sounds normal; no masses,  no organomegaly Extremities: extremities normal, atraumatic, no cyanosis or edema  ECOG PERFORMANCE STATUS: 0 - Asymptomatic  Blood pressure 156/87, pulse 119, temperature 98.1 F (36.7 C), temperature source Oral, resp. rate 19, height 5\' 1"  (1.549 m), weight 115 lb 11.2 oz (52.481 kg).  LABORATORY DATA: Lab Results  Component Value Date   WBC 4.5 06/13/2013   HGB 12.3 06/13/2013   HCT 35.7 06/13/2013   MCV 86.0 06/13/2013   PLT 600* 06/13/2013      Chemistry      Component Value Date/Time   NA 131* 05/30/2013 0821   NA 135 02/20/2013 1640   K 4.1 05/30/2013 0821   K 3.9 02/20/2013 1640   CL 95* 05/10/2013 1249   CL 95* 02/20/2013 1640   CO2 30* 05/30/2013 0821   CO2 31 02/20/2013 1640   BUN 10.9 05/30/2013 0821   BUN 12 02/20/2013 1640   CREATININE 0.6 05/30/2013 0821   CREATININE 0.39* 02/20/2013 1640   CREATININE 0.54 06/24/2010 1951      Component Value Date/Time   CALCIUM 9.2 05/30/2013 0821   CALCIUM 10.1 02/20/2013 1640   ALKPHOS 228* 05/30/2013 0821   ALKPHOS 155* 02/20/2013 1640   AST 36* 05/30/2013 0821   AST 29 02/20/2013 1640  ALT 30 05/30/2013 0821   ALT 17 02/20/2013 1640   BILITOT 0.48 05/30/2013 0821   BILITOT 0.5 02/20/2013 1640       RADIOGRAPHIC STUDIES: Ct Chest W Contrast  05/15/2013   *RADIOLOGY REPORT*  Clinical Data:  Cholangiocarcinoma.  Right upper quadrant abdominal pain.  CT CHEST, ABDOMEN AND PELVIS WITH CONTRAST  Technique:  Multidetector CT imaging of the chest, abdomen and pelvis was performed following the standard protocol during bolus administration of intravenous contrast.  Contrast: OMNIPAQUE  IOHEXOL 300 MG/ML  SOLN  Comparison:  11/17/2006 CT scan  CT CHEST  Findings:  No pathologic thoracic adenopathy observed.  Suspected filling defect in the right posterior basal segmental pulmonary artery, image 37 of series 603, suspicious for pulmonary embolus. No worrisome pulmonary nodule observed.  Mild thoracic spondylosis. Tiny sclerotic lesion in the T7 vertebral body centrally is likely benign.  IMPRESSION:  1.  Small suspected pulmonary embolus in the right posterior basal segment pulmonary artery.  Please note that today's exam was not performed with pulmonary embolus protocol and accordingly has reduced sensitivity and specificity compared to dedicated CT angiography.  CT ABDOMEN AND PELVIS  Findings:  The dominant mass spanning segments 4A, 4B, 5, and 8 of the liver measures 12.6 x 7.9 cm on image 51 of series 2. Scattered smaller masses are present in all segments of the liver. Multiple abnormal gastrohepatic, peripancreatic, and porta hepatis lymph nodes are present along with pathologic retroperitoneal adenopathy.  An index lymph node just above the left renal vein has a short axis diameter of 1.2 cm, image 63 of series 2.  The second left retroperitoneal lymph node at the level of the inferior mesenteric artery takeoff has a short axis diameter 1.0 cm. Retroperitoneal stranding noted.  The kidneys appear unremarkable, as do the proximal ureters.  Gallbladder contracted and poorly seen.  Mixed density in the iliac veins may relate to contrast timing rather than thrombus.  Appendix unremarkable.  No dilated bowel.  There is a small but abnormal amount of pelvic ascites.  Urinary bladder empty.  Uterus absent.  There is mild nodularity in the omentum.  IMPRESSION:  1.  Large central hepatic mass with numerous scattered smaller masses throughout all segments of the liver.  Appearance compatible with reported cholangiocarcinoma. 2.  Retroperitoneal, porta hepatis, gastrohepatic ligament, and  peripancreatic adenopathy with mild mesenteric adenopathy, favoring metastatic disease. 3.  Poorly seen gallbladder, contracted. 4.  Pelvic ascites.  Mild nodularity of the omentum could represent early metastatic deposits.   Original Report Authenticated By: Gaylyn Rong, M.D.   Ct Abdomen Pelvis W Contrast  05/15/2013   *RADIOLOGY REPORT*  Clinical Data:  Cholangiocarcinoma.  Right upper quadrant abdominal pain.  CT CHEST, ABDOMEN AND PELVIS WITH CONTRAST  Technique:  Multidetector CT imaging of the chest, abdomen and pelvis was performed following the standard protocol during bolus administration of intravenous contrast.  Contrast: OMNIPAQUE IOHEXOL 300 MG/ML  SOLN  Comparison:  11/17/2006 CT scan  CT CHEST  Findings:  No pathologic thoracic adenopathy observed.  Suspected filling defect in the right posterior basal segmental pulmonary artery, image 37 of series 603, suspicious for pulmonary embolus. No worrisome pulmonary nodule observed.  Mild thoracic spondylosis. Tiny sclerotic lesion in the T7 vertebral body centrally is likely benign.  IMPRESSION:  1.  Small suspected pulmonary embolus in the right posterior basal segment pulmonary artery.  Please note that today's exam was not performed with pulmonary embolus protocol and accordingly has reduced sensitivity  and specificity compared to dedicated CT angiography.  CT ABDOMEN AND PELVIS  Findings:  The dominant mass spanning segments 4A, 4B, 5, and 8 of the liver measures 12.6 x 7.9 cm on image 51 of series 2. Scattered smaller masses are present in all segments of the liver. Multiple abnormal gastrohepatic, peripancreatic, and porta hepatis lymph nodes are present along with pathologic retroperitoneal adenopathy.  An index lymph node just above the left renal vein has a short axis diameter of 1.2 cm, image 63 of series 2.  The second left retroperitoneal lymph node at the level of the inferior mesenteric artery takeoff has a short axis diameter  1.0 cm. Retroperitoneal stranding noted.  The kidneys appear unremarkable, as do the proximal ureters.  Gallbladder contracted and poorly seen.  Mixed density in the iliac veins may relate to contrast timing rather than thrombus.  Appendix unremarkable.  No dilated bowel.  There is a small but abnormal amount of pelvic ascites.  Urinary bladder empty.  Uterus absent.  There is mild nodularity in the omentum.  IMPRESSION:  1.  Large central hepatic mass with numerous scattered smaller masses throughout all segments of the liver.  Appearance compatible with reported cholangiocarcinoma. 2.  Retroperitoneal, porta hepatis, gastrohepatic ligament, and peripancreatic adenopathy with mild mesenteric adenopathy, favoring metastatic disease. 3.  Poorly seen gallbladder, contracted. 4.  Pelvic ascites.  Mild nodularity of the omentum could represent early metastatic deposits.   Original Report Authenticated By: Gaylyn Rong, M.D.   Ir Fluoro Guide Cv Line Right  05/24/2013   *RADIOLOGY REPORT*  TUNNELED CENTRAL VENOUS CATHETER WITH SUBCUTANEOUS RESERVOIR (PORTACATH) PLACEMENT WITH ULTRASOUND AND FLUOROSCOPIC  GUIDANCE  Date: 05/24/2013  Clinical History: 57 year old female with cholangiocarcinoma in need of durable central venous access for chemotherapy.  Sedation: Moderate (conscious) sedation was administered during this procedure.  A total of tomg Versed and 100mg  Fentanyl were administered intravenously.  The patient's vital signs were monitored continuously by radiology nursing throughout the course of the procedure.  Total sedation time: 25 minutes  Fluoroscopy Time: 30 seconds  Procedure:  The right neck and chest was prepped with chlorhexidine, and draped in the usual sterile fashion using maximum barrier technique (cap and mask, sterile gown, sterile gloves, large sterile sheet, hand hygiene and cutaneous antiseptic).  Antibiotic prophylaxis was provided with twog Ancef administered IV one hour prior to skin  incision.  Local anesthesia was attained by infiltration with 1% lidocaine with epinephrine.  Ultrasound demonstrated patency of the right internal jugular vein, and this was documented with an image.  Under real-time ultrasound guidance, this vein was accessed with a 21 gauge micropuncture needle and image documentation was performed.  A small dermatotomy was made at the access site with an 11 scalpel.  A 0.018" wire was advanced into the SVC and the access needle exchanged for a 46F micropuncture vascular sheath.  The 0.018" wire was then removed and a 0.035" wire advanced into the IVC.  An appropriate location for the subcutaneous reservoir was selected below the clavicle and an incision was made through the skin and underlying soft tissues.  The subcutaneous tissues were then dissected using a combination of blunt and sharp surgical technique and a pocket was formed.  A single lumen power injectable portacatheter was then tunneled through the subcutaneous tissues from the pocket to the dermatotomy and the port reservoir placed within the subcutaneous pocket.  The venous access site was then serially dilated and a peel away vascular sheath placed over the wire.  The wire was removed and the port catheter advanced into position under fluoroscopic guidance. The catheter tip is positioned in the superior cavoatrial junction. This was documented with a spot image. The portacatheter was then tested and found to flush and aspirate well.  The port was flushed with saline followed by 100 units/mL heparinized saline.  The pocket was then closed in two layers using first subdermal inverted interrupted absorbable sutures followed by a running subcuticular suture.  The epidermis was then sealed with Dermabond. The dermatotomy at the venous access site was also closed with a single inverted subdermal suture and the epidermis sealed with Dermabond.  Complications:  None.  The patient tolerated the procedure well.  IMPRESSION:   Successful placement of a right IJapproach PowerPort with ultrasound and fluoroscopic guidance.  The catheter is ready for use.  Signed,  Sterling Big, MD Vascular & Interventional Radiologist [facility]   Original Report Authenticated By: Malachy Moan, M.D.   Ir US Guide Vasc Access Right  05/24/2013   *RADIOLOGY REPORT*  TUNNELED CENTRAL VENOUS CATHETER WITH SUBCUTANEOUS RESERVOIR (PORTACATH) PLACEMENT WITH ULTRASOUND AND FLUOROSCOPIC  GUIDANCE  Date: 05/24/2013  Clinical History: 57 year old female with cholangiocarcinoma in need of durable central venous access for chemotherapy.  Sedation: Moderate (conscious) sedation was administered during this procedure.  A total of tomg Versed and 100mg  Fentanyl were administered intravenously.  The patient's vital signs were monitored continuously by radiology nursing throughout the course of the procedure.  Total sedation time: 25 minutes  Fluoroscopy Time: 30 seconds  Procedure:  The right neck and chest was prepped with chlorhexidine, and draped in the usual sterile fashion using maximum barrier technique (cap and mask, sterile gown, sterile gloves, large sterile sheet, hand hygiene and cutaneous antiseptic).  Antibiotic prophylaxis was provided with twog Ancef administered IV one hour prior to skin incision.  Local anesthesia was attained by infiltration with 1% lidocaine with epinephrine.  Ultrasound demonstrated patency of the right internal jugular vein, and this was documented with an image.  Under real-time ultrasound guidance, this vein was accessed with a 21 gauge micropuncture needle and image documentation was performed.  A small dermatotomy was made at the access site with an 11 scalpel.  A 0.018" wire was advanced into the SVC and the access needle exchanged for a 63F micropuncture vascular sheath.  The 0.018" wire was then removed and a 0.035" wire advanced into the IVC.  An appropriate location for the subcutaneous reservoir was  selected below the clavicle and an incision was made through the skin and underlying soft tissues.  The subcutaneous tissues were then dissected using a combination of blunt and sharp surgical technique and a pocket was formed.  A single lumen power injectable portacatheter was then tunneled through the subcutaneous tissues from the pocket to the dermatotomy and the port reservoir placed within the subcutaneous pocket.  The venous access site was then serially dilated and a peel away vascular sheath placed over the wire.  The wire was removed and the port catheter advanced into position under fluoroscopic guidance. The catheter tip is positioned in the superior cavoatrial junction. This was documented with a spot image. The portacatheter was then tested and found to flush and aspirate well.  The port was flushed with saline followed by 100 units/mL heparinized saline.  The pocket was then closed in two layers using first subdermal inverted interrupted absorbable sutures followed by a running subcuticular suture.  The epidermis was then sealed with Dermabond. The dermatotomy at  the venous access site was also closed with a single inverted subdermal suture and the epidermis sealed with Dermabond.  Complications:  None.  The patient tolerated the procedure well.  IMPRESSION:  Successful placement of a right IJapproach PowerPort with ultrasound and fluoroscopic guidance.  The catheter is ready for use.  Signed,  Sterling Big, MD Vascular & Interventional Radiologist [facility]   Original Report Authenticated By: Malachy Moan, M.D.    ASSESSMENT AND PLAN: This is a very pleasant 57 years old Hispanic female was metastatic cholangiocarcinoma currently undergoing systemic chemotherapy with cisplatin and gemcitabine is status post 1 cycle.  The patient related the first cycle of her treatment fairly well with no significant adverse effects. We'll proceed with cycle #2 today as scheduled.  The  patient would come back for followup visit in 3 weeks with the next cycle of her chemotherapy.  She was advised to call immediately she has any concerning symptoms in the interval.  All questions were answered. The patient knows to call the clinic with any problems, questions or concerns. We can certainly see the patient much sooner if necessary.

## 2013-06-13 NOTE — Telephone Encounter (Signed)
Per staff message and POF I have scheduled appts.  [name]  

## 2013-06-13 NOTE — Patient Instructions (Addendum)
[facility] Discharge Instructions for Patients Receiving Chemotherapy  Today you received the following chemotherapy agents: Gemzar, Cisplatin  To help prevent nausea and vomiting after your treatment, we encourage you to take your nausea medication as directed by your physician.   If you develop nausea and vomiting that is not controlled by your nausea medication, call the clinic.   BELOW ARE SYMPTOMS THAT SHOULD BE REPORTED IMMEDIATELY:  *FEVER GREATER THAN 100.5 F  *CHILLS WITH OR WITHOUT FEVER  NAUSEA AND VOMITING THAT IS NOT CONTROLLED WITH YOUR NAUSEA MEDICATION  *UNUSUAL SHORTNESS OF BREATH  *UNUSUAL BRUISING OR BLEEDING  TENDERNESS IN MOUTH AND THROAT WITH OR WITHOUT PRESENCE OF ULCERS  *URINARY PROBLEMS  *BOWEL PROBLEMS  UNUSUAL RASH Items with * indicate a potential emergency and should be followed up as soon as possible.  Feel free to call the clinic you have any questions or concerns. The clinic phone number is [phone].

## 2013-06-13 NOTE — Telephone Encounter (Signed)
gv and printed avs for pt..emailed MW to move 8.13.14 tx after MD visit

## 2013-06-20 ENCOUNTER — Ambulatory Visit (HOSPITAL_BASED_OUTPATIENT_CLINIC_OR_DEPARTMENT_OTHER): Payer: Managed Care, Other (non HMO)

## 2013-06-20 ENCOUNTER — Other Ambulatory Visit (HOSPITAL_BASED_OUTPATIENT_CLINIC_OR_DEPARTMENT_OTHER): Payer: Managed Care, Other (non HMO) | Admitting: Lab

## 2013-06-20 VITALS — BP 141/91 | HR 87 | Temp 97.0°F | Resp 22

## 2013-06-20 DIAGNOSIS — Z5111 Encounter for antineoplastic chemotherapy: Secondary | ICD-10-CM

## 2013-06-20 DIAGNOSIS — C787 Secondary malignant neoplasm of liver and intrahepatic bile duct: Secondary | ICD-10-CM

## 2013-06-20 DIAGNOSIS — C221 Intrahepatic bile duct carcinoma: Secondary | ICD-10-CM

## 2013-06-20 LAB — CBC WITH DIFFERENTIAL/PLATELET
BASO%: 0.2 % (ref 0.0–2.0)
Basophils Absolute: 0 10*3/uL (ref 0.0–0.1)
EOS%: 0 % (ref 0.0–7.0)
HGB: 12.2 g/dL (ref 11.6–15.9)
MCH: 29.8 pg (ref 25.1–34.0)
MCHC: 35.6 g/dL (ref 31.5–36.0)
MONO%: 9.4 % (ref 0.0–14.0)
RBC: 4.09 10*6/uL (ref 3.70–5.45)
RDW: 14.1 % (ref 11.2–14.5)
lymph#: 1.4 10*3/uL (ref 0.9–3.3)
nRBC: 0 % (ref 0–0)

## 2013-06-20 LAB — COMPREHENSIVE METABOLIC PANEL (CC13)
ALT: 29 U/L (ref 0–55)
BUN: 10.4 mg/dL (ref 7.0–26.0)
CO2: 26 mEq/L (ref 22–29)
Creatinine: 0.7 mg/dL (ref 0.6–1.1)
Total Bilirubin: 0.26 mg/dL (ref 0.20–1.20)

## 2013-06-20 MED ORDER — PROCHLORPERAZINE MALEATE 10 MG PO TABS
10.0000 mg | ORAL_TABLET | Freq: Once | ORAL | Status: AC
  Administered 2013-06-20: 10 mg via ORAL

## 2013-06-20 MED ORDER — SODIUM CHLORIDE 0.9 % IV SOLN
Freq: Once | INTRAVENOUS | Status: AC
  Administered 2013-06-20: 14:00:00 via INTRAVENOUS

## 2013-06-20 MED ORDER — SODIUM CHLORIDE 0.9 % IJ SOLN
10.0000 mL | INTRAMUSCULAR | Status: DC | PRN
Start: 2013-06-20 — End: 2013-06-20
  Administered 2013-06-20: 10 mL
  Filled 2013-06-20: qty 10

## 2013-06-20 MED ORDER — SODIUM CHLORIDE 0.9 % IV SOLN
800.0000 mg/m2 | Freq: Once | INTRAVENOUS | Status: AC
  Administered 2013-06-20: 1216 mg via INTRAVENOUS
  Filled 2013-06-20: qty 32

## 2013-06-20 MED ORDER — HEPARIN SOD (PORK) LOCK FLUSH 100 UNIT/ML IV SOLN
500.0000 [IU] | Freq: Once | INTRAVENOUS | Status: AC | PRN
  Administered 2013-06-20: 500 [IU]
  Filled 2013-06-20: qty 5

## 2013-06-20 NOTE — Patient Instructions (Addendum)
[facility] Discharge Instructions for Patients Receiving Chemotherapy  Today you received the following chemotherapy agents: Gemzar   To help prevent nausea and vomiting after your treatment, we encourage you to take your nausea medication as instructed by your physician.    If you develop nausea and vomiting that is not controlled by your nausea medication, call the clinic.   BELOW ARE SYMPTOMS THAT SHOULD BE REPORTED IMMEDIATELY:  *FEVER GREATER THAN 100.5 F  *CHILLS WITH OR WITHOUT FEVER  NAUSEA AND VOMITING THAT IS NOT CONTROLLED WITH YOUR NAUSEA MEDICATION  *UNUSUAL SHORTNESS OF BREATH  *UNUSUAL BRUISING OR BLEEDING  TENDERNESS IN MOUTH AND THROAT WITH OR WITHOUT PRESENCE OF ULCERS  *URINARY PROBLEMS  *BOWEL PROBLEMS  UNUSUAL RASH Items with * indicate a potential emergency and should be followed up as soon as possible.  Feel free to call the clinic you have any questions or concerns. The clinic phone number is [phone].

## 2013-06-27 ENCOUNTER — Other Ambulatory Visit: Payer: Self-pay

## 2013-07-04 ENCOUNTER — Telehealth: Payer: Self-pay | Admitting: *Deleted

## 2013-07-04 ENCOUNTER — Ambulatory Visit (HOSPITAL_BASED_OUTPATIENT_CLINIC_OR_DEPARTMENT_OTHER): Payer: Managed Care, Other (non HMO)

## 2013-07-04 ENCOUNTER — Other Ambulatory Visit: Payer: Self-pay | Admitting: *Deleted

## 2013-07-04 ENCOUNTER — Ambulatory Visit (HOSPITAL_BASED_OUTPATIENT_CLINIC_OR_DEPARTMENT_OTHER): Payer: Managed Care, Other (non HMO) | Admitting: Physician Assistant

## 2013-07-04 ENCOUNTER — Other Ambulatory Visit (HOSPITAL_BASED_OUTPATIENT_CLINIC_OR_DEPARTMENT_OTHER): Payer: Managed Care, Other (non HMO) | Admitting: Lab

## 2013-07-04 ENCOUNTER — Other Ambulatory Visit: Payer: Self-pay | Admitting: Lab

## 2013-07-04 VITALS — BP 136/95 | HR 91 | Temp 97.9°F | Resp 18 | Ht 61.0 in | Wt 112.1 lb

## 2013-07-04 DIAGNOSIS — C787 Secondary malignant neoplasm of liver and intrahepatic bile duct: Secondary | ICD-10-CM

## 2013-07-04 DIAGNOSIS — Z5111 Encounter for antineoplastic chemotherapy: Secondary | ICD-10-CM

## 2013-07-04 DIAGNOSIS — C221 Intrahepatic bile duct carcinoma: Secondary | ICD-10-CM

## 2013-07-04 LAB — CBC WITH DIFFERENTIAL/PLATELET
Basophils Absolute: 0 10*3/uL (ref 0.0–0.1)
Eosinophils Absolute: 0 10*3/uL (ref 0.0–0.5)
HCT: 35.4 % (ref 34.8–46.6)
HGB: 12.3 g/dL (ref 11.6–15.9)
LYMPH%: 18.8 % (ref 14.0–49.7)
MCHC: 34.7 g/dL (ref 31.5–36.0)
MONO#: 0.9 10*3/uL (ref 0.1–0.9)
NEUT#: 3 10*3/uL (ref 1.5–6.5)
NEUT%: 61.8 % (ref 38.4–76.8)
Platelets: 281 10*3/uL (ref 145–400)
WBC: 4.8 10*3/uL (ref 3.9–10.3)
lymph#: 0.9 10*3/uL (ref 0.9–3.3)

## 2013-07-04 LAB — COMPREHENSIVE METABOLIC PANEL (CC13)
Albumin: 4.1 g/dL (ref 3.5–5.0)
BUN: 6.8 mg/dL — ABNORMAL LOW (ref 7.0–26.0)
CO2: 25 mEq/L (ref 22–29)
Calcium: 10 mg/dL (ref 8.4–10.4)
Chloride: 95 mEq/L — ABNORMAL LOW (ref 98–109)
Creatinine: 0.5 mg/dL — ABNORMAL LOW (ref 0.6–1.1)
Glucose: 98 mg/dl (ref 70–140)
Potassium: 4.4 mEq/L (ref 3.5–5.1)

## 2013-07-04 MED ORDER — POTASSIUM CHLORIDE 2 MEQ/ML IV SOLN
Freq: Once | INTRAVENOUS | Status: AC
  Administered 2013-07-04: 11:00:00 via INTRAVENOUS
  Filled 2013-07-04: qty 10

## 2013-07-04 MED ORDER — DEXAMETHASONE SODIUM PHOSPHATE 20 MG/5ML IJ SOLN
12.0000 mg | Freq: Once | INTRAMUSCULAR | Status: AC
  Administered 2013-07-04: 12 mg via INTRAVENOUS

## 2013-07-04 MED ORDER — SODIUM CHLORIDE 0.9 % IV SOLN
INTRAVENOUS | Status: DC
  Administered 2013-07-04: 10:00:00 via INTRAVENOUS

## 2013-07-04 MED ORDER — SODIUM CHLORIDE 0.9 % IJ SOLN
10.0000 mL | INTRAMUSCULAR | Status: DC | PRN
  Administered 2013-07-04: 10 mL
  Filled 2013-07-04: qty 10

## 2013-07-04 MED ORDER — HEPARIN SOD (PORK) LOCK FLUSH 100 UNIT/ML IV SOLN
500.0000 [IU] | Freq: Once | INTRAVENOUS | Status: AC | PRN
  Administered 2013-07-04: 500 [IU]
  Filled 2013-07-04: qty 5

## 2013-07-04 MED ORDER — SODIUM CHLORIDE 0.9 % IV SOLN
100.0000 mg/m2 | Freq: Once | INTRAVENOUS | Status: AC
  Administered 2013-07-04: 150 mg via INTRAVENOUS
  Filled 2013-07-04: qty 150

## 2013-07-04 MED ORDER — SODIUM CHLORIDE 0.9 % IV SOLN
800.0000 mg/m2 | Freq: Once | INTRAVENOUS | Status: AC
  Administered 2013-07-04: 1216 mg via INTRAVENOUS
  Filled 2013-07-04: qty 31.98

## 2013-07-04 MED ORDER — SODIUM CHLORIDE 0.9 % IV SOLN
150.0000 mg | Freq: Once | INTRAVENOUS | Status: AC
  Administered 2013-07-04: 150 mg via INTRAVENOUS
  Filled 2013-07-04: qty 5

## 2013-07-04 MED ORDER — PALONOSETRON HCL INJECTION 0.25 MG/5ML
0.2500 mg | Freq: Once | INTRAVENOUS | Status: AC
  Administered 2013-07-04: 0.25 mg via INTRAVENOUS

## 2013-07-04 NOTE — Patient Instructions (Addendum)
[facility] Discharge Instructions for Patients Receiving Chemotherapy  Today you received the following chemotherapy agents Gemzar/Cisplatin To help prevent nausea and vomiting after your treatment, we encourage you to take your nausea medication as prescribed.  If you develop nausea and vomiting that is not controlled by your nausea medication, call the clinic.   BELOW ARE SYMPTOMS THAT SHOULD BE REPORTED IMMEDIATELY:  *FEVER GREATER THAN 100.5 F  *CHILLS WITH OR WITHOUT FEVER  NAUSEA AND VOMITING THAT IS NOT CONTROLLED WITH YOUR NAUSEA MEDICATION  *UNUSUAL SHORTNESS OF BREATH  *UNUSUAL BRUISING OR BLEEDING  TENDERNESS IN MOUTH AND THROAT WITH OR WITHOUT PRESENCE OF ULCERS  *URINARY PROBLEMS  *BOWEL PROBLEMS  UNUSUAL RASH Items with * indicate a potential emergency and should be followed up as soon as possible.  Feel free to call the clinic you have any questions or concerns. The clinic phone number is [phone].    

## 2013-07-04 NOTE — Telephone Encounter (Signed)
appts made and printed...td 

## 2013-07-04 NOTE — Telephone Encounter (Signed)
Per staff message and POF I have scheduled appts.  [name]  

## 2013-07-05 NOTE — Progress Notes (Addendum)
[facility] [phone]   [phone]  SHARED VISIT PROGRESS NOTE  Lora Paula, MD [address] 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   CURRENT THERAPY: systemic chemotherapy with cisplatin 100 mg/M2 on day 1 and gemcitabine 800 mg/M2 on days 1 and 8 every 3 weeks. Status post 2 cycles  DISEASE STAGE: Stage IV  CHEMOTHERAPY INTENT: Palliative  CURRENT # OF CHEMOTHERAPY CYCLES: 3  CURRENT ANTIEMETICS: Compazine  CURRENT SMOKING STATUS: Never smoker  ORAL CHEMOTHERAPY AND CONSENT: N./A.  CURRENT BISPHOSPHONATES USE: None  PAIN MANAGEMENT: None  NARCOTICS INDUCED CONSTIPATION: None  LIVING WILL AND CODE STATUS: Full code    INTERVAL HISTORY: Tammy [name] 57 y.o. female returns to the clinic today for followup visit accompanied by her husband and her Spanish interpreter. She continues to tolerate her chemotherapy relatively well with only mild nausea that is well-controlled with her current antiemetics. She voices no specific complaints today.  She denied having any significant fatigue weakness. She denied having any nausea or vomiting. She has no fever or chills. The patient denied having any significant chest pain, shortness of breath, cough or hemoptysis. She has no peripheral neuropathy or hearing issues. She is here today to start cycle #3 of her systemic therapy.  MEDICAL HISTORY: Past Medical History  Diagnosis Date  . Arthritis   . Polyp of colon 11/14/2008    seen on colonoscopy, f/u colonoscopy recommended in 5 years.   . 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  MEDICATIONS:  Current Outpatient Prescriptions  Medication Sig Dispense Refill  . lidocaine-prilocaine (EMLA) cream Apply topically as needed. Apply to port 1 hr before chemo  30 g  0  . prochlorperazine (COMPAZINE) 10 MG tablet Take 1  tablet (10 mg total) by mouth every 6 (six) hours as needed (nausa and vomiting).  30 tablet  1   No current facility-administered medications for this visit.    SURGICAL HISTORY:  Past Surgical History  Procedure Laterality Date  . Abdominal hysterectomy  01/24/2007    including cervix, benign    REVIEW OF SYSTEMS:  A comprehensive review of systems was negative.   PHYSICAL EXAMINATION: General appearance: alert, cooperative and no distress Head: Normocephalic, without obvious abnormality, atraumatic Neck: no adenopathy Lymph nodes: Cervical, supraclavicular, and axillary nodes normal. Resp: clear to auscultation bilaterally Cardio: regular rate and rhythm, S1, S2 normal, no murmur, click, rub or gallop GI: soft, non-tender; bowel sounds normal; no masses,  no organomegaly Extremities: extremities normal, atraumatic, no cyanosis or edema  ECOG PERFORMANCE STATUS: 0 - Asymptomatic  Blood pressure 136/95, pulse 91, temperature 97.9 F (36.6 C), temperature source Oral, resp. rate 18, height 5\' 1"  (1.549 m), weight 112 lb 1.6 oz (50.848 kg).  LABORATORY DATA: Lab Results  Component Value Date   WBC 4.8 07/04/2013   HGB 12.3 07/04/2013   HCT 35.4 07/04/2013   MCV 85.7 07/04/2013   PLT 281 07/04/2013      Chemistry      Component Value Date/Time   NA 133* 07/04/2013 0858   NA 135 02/20/2013 1640   K 4.4 07/04/2013 0858   K 3.9 02/20/2013 1640   CL 95* 05/10/2013 1249   CL 95* 02/20/2013 1640   CO2 25 07/04/2013 0858   CO2 31 02/20/2013 1640   BUN 6.8* 07/04/2013 0858   BUN 12 02/20/2013  1640   CREATININE 0.5* 07/04/2013 0858   CREATININE 0.39* 02/20/2013 1640   CREATININE 0.54 06/24/2010 1951      Component Value Date/Time   CALCIUM 10.0 07/04/2013 0858   CALCIUM 10.1 02/20/2013 1640   ALKPHOS 134 07/04/2013 0858   ALKPHOS 155* 02/20/2013 1640   AST 24 07/04/2013 0858   AST 29 02/20/2013 1640   ALT 11 07/04/2013 0858   ALT 17 02/20/2013 1640   BILITOT 0.46 07/04/2013 0858   BILITOT 0.5 02/20/2013  1640       RADIOGRAPHIC STUDIES: Ct Chest W Contrast  05/15/2013   *RADIOLOGY REPORT*  Clinical Data:  Cholangiocarcinoma.  Right upper quadrant abdominal pain.  CT CHEST, ABDOMEN AND PELVIS WITH CONTRAST  Technique:  Multidetector CT imaging of the chest, abdomen and pelvis was performed following the standard protocol during bolus administration of intravenous contrast.  Contrast: OMNIPAQUE IOHEXOL 300 MG/ML  SOLN  Comparison:  11/17/2006 CT scan  CT CHEST  Findings:  No pathologic thoracic adenopathy observed.  Suspected filling defect in the right posterior basal segmental pulmonary artery, image 37 of series 603, suspicious for pulmonary embolus. No worrisome pulmonary nodule observed.  Mild thoracic spondylosis. Tiny sclerotic lesion in the T7 vertebral body centrally is likely benign.  IMPRESSION:  1.  Small suspected pulmonary embolus in the right posterior basal segment pulmonary artery.  Please note that today's exam was not performed with pulmonary embolus protocol and accordingly has reduced sensitivity and specificity compared to dedicated CT angiography.  CT ABDOMEN AND PELVIS  Findings:  The dominant mass spanning segments 4A, 4B, 5, and 8 of the liver measures 12.6 x 7.9 cm on image 51 of series 2. Scattered smaller masses are present in all segments of the liver. Multiple abnormal gastrohepatic, peripancreatic, and porta hepatis lymph nodes are present along with pathologic retroperitoneal adenopathy.  An index lymph node just above the left renal vein has a short axis diameter of 1.2 cm, image 63 of series 2.  The second left retroperitoneal lymph node at the level of the inferior mesenteric artery takeoff has a short axis diameter 1.0 cm. Retroperitoneal stranding noted.  The kidneys appear unremarkable, as do the proximal ureters.  Gallbladder contracted and poorly seen.  Mixed density in the iliac veins may relate to contrast timing rather than thrombus.  Appendix unremarkable.  No  dilated bowel.  There is a small but abnormal amount of pelvic ascites.  Urinary bladder empty.  Uterus absent.  There is mild nodularity in the omentum.  IMPRESSION:  1.  Large central hepatic mass with numerous scattered smaller masses throughout all segments of the liver.  Appearance compatible with reported cholangiocarcinoma. 2.  Retroperitoneal, porta hepatis, gastrohepatic ligament, and peripancreatic adenopathy with mild mesenteric adenopathy, favoring metastatic disease. 3.  Poorly seen gallbladder, contracted. 4.  Pelvic ascites.  Mild nodularity of the omentum could represent early metastatic deposits.   Original Report Authenticated By: Gaylyn Rong, M.D.   Ct Abdomen Pelvis W Contrast  05/15/2013   *RADIOLOGY REPORT*  Clinical Data:  Cholangiocarcinoma.  Right upper quadrant abdominal pain.  CT CHEST, ABDOMEN AND PELVIS WITH CONTRAST  Technique:  Multidetector CT imaging of the chest, abdomen and pelvis was performed following the standard protocol during bolus administration of intravenous contrast.  Contrast: OMNIPAQUE IOHEXOL 300 MG/ML  SOLN  Comparison:  11/17/2006 CT scan  CT CHEST  Findings:  No pathologic thoracic adenopathy observed.  Suspected filling defect in the right posterior basal segmental pulmonary artery,  image 37 of series 603, suspicious for pulmonary embolus. No worrisome pulmonary nodule observed.  Mild thoracic spondylosis. Tiny sclerotic lesion in the T7 vertebral body centrally is likely benign.  IMPRESSION:  1.  Small suspected pulmonary embolus in the right posterior basal segment pulmonary artery.  Please note that today's exam was not performed with pulmonary embolus protocol and accordingly has reduced sensitivity and specificity compared to dedicated CT angiography.  CT ABDOMEN AND PELVIS  Findings:  The dominant mass spanning segments 4A, 4B, 5, and 8 of the liver measures 12.6 x 7.9 cm on image 51 of series 2. Scattered smaller masses are present in all  segments of the liver. Multiple abnormal gastrohepatic, peripancreatic, and porta hepatis lymph nodes are present along with pathologic retroperitoneal adenopathy.  An index lymph node just above the left renal vein has a short axis diameter of 1.2 cm, image 63 of series 2.  The second left retroperitoneal lymph node at the level of the inferior mesenteric artery takeoff has a short axis diameter 1.0 cm. Retroperitoneal stranding noted.  The kidneys appear unremarkable, as do the proximal ureters.  Gallbladder contracted and poorly seen.  Mixed density in the iliac veins may relate to contrast timing rather than thrombus.  Appendix unremarkable.  No dilated bowel.  There is a small but abnormal amount of pelvic ascites.  Urinary bladder empty.  Uterus absent.  There is mild nodularity in the omentum.  IMPRESSION:  1.  Large central hepatic mass with numerous scattered smaller masses throughout all segments of the liver.  Appearance compatible with reported cholangiocarcinoma. 2.  Retroperitoneal, porta hepatis, gastrohepatic ligament, and peripancreatic adenopathy with mild mesenteric adenopathy, favoring metastatic disease. 3.  Poorly seen gallbladder, contracted. 4.  Pelvic ascites.  Mild nodularity of the omentum could represent early metastatic deposits.   Original Report Authenticated By: Gaylyn Rong, M.D.   Ir Fluoro Guide Cv Line Right  05/24/2013   *RADIOLOGY REPORT*  TUNNELED CENTRAL VENOUS CATHETER WITH SUBCUTANEOUS RESERVOIR (PORTACATH) PLACEMENT WITH ULTRASOUND AND FLUOROSCOPIC  GUIDANCE  Date: 05/24/2013  Clinical History: 57 year old female with cholangiocarcinoma in need of durable central venous access for chemotherapy.  Sedation: Moderate (conscious) sedation was administered during this procedure.  A total of tomg Versed and 100mg  Fentanyl were administered intravenously.  The patient's vital signs were monitored continuously by radiology nursing throughout the course of the procedure.   Total sedation time: 25 minutes  Fluoroscopy Time: 30 seconds  Procedure:  The right neck and chest was prepped with chlorhexidine, and draped in the usual sterile fashion using maximum barrier technique (cap and mask, sterile gown, sterile gloves, large sterile sheet, hand hygiene and cutaneous antiseptic).  Antibiotic prophylaxis was provided with twog Ancef administered IV one hour prior to skin incision.  Local anesthesia was attained by infiltration with 1% lidocaine with epinephrine.  Ultrasound demonstrated patency of the right internal jugular vein, and this was documented with an image.  Under real-time ultrasound guidance, this vein was accessed with a 21 gauge micropuncture needle and image documentation was performed.  A small dermatotomy was made at the access site with an 11 scalpel.  A 0.018" wire was advanced into the SVC and the access needle exchanged for a 29F micropuncture vascular sheath.  The 0.018" wire was then removed and a 0.035" wire advanced into the IVC.  An appropriate location for the subcutaneous reservoir was selected below the clavicle and an incision was made through the skin and underlying soft tissues.  The subcutaneous tissues  were then dissected using a combination of blunt and sharp surgical technique and a pocket was formed.  A single lumen power injectable portacatheter was then tunneled through the subcutaneous tissues from the pocket to the dermatotomy and the port reservoir placed within the subcutaneous pocket.  The venous access site was then serially dilated and a peel away vascular sheath placed over the wire.  The wire was removed and the port catheter advanced into position under fluoroscopic guidance. The catheter tip is positioned in the superior cavoatrial junction. This was documented with a spot image. The portacatheter was then tested and found to flush and aspirate well.  The port was flushed with saline followed by 100 units/mL heparinized saline.  The pocket  was then closed in two layers using first subdermal inverted interrupted absorbable sutures followed by a running subcuticular suture.  The epidermis was then sealed with Dermabond. The dermatotomy at the venous access site was also closed with a single inverted subdermal suture and the epidermis sealed with Dermabond.  Complications:  None.  The patient tolerated the procedure well.  IMPRESSION:  Successful placement of a right IJapproach PowerPort with ultrasound and fluoroscopic guidance.  The catheter is ready for use.  Signed,  Sterling Big, MD Vascular & Interventional Radiologist [facility]   Original Report Authenticated By: Malachy Moan, M.D.   Ir US Guide Vasc Access Right  05/24/2013   *RADIOLOGY REPORT*  TUNNELED CENTRAL VENOUS CATHETER WITH SUBCUTANEOUS RESERVOIR (PORTACATH) PLACEMENT WITH ULTRASOUND AND FLUOROSCOPIC  GUIDANCE  Date: 05/24/2013  Clinical History: 57 year old female with cholangiocarcinoma in need of durable central venous access for chemotherapy.  Sedation: Moderate (conscious) sedation was administered during this procedure.  A total of tomg Versed and 100mg  Fentanyl were administered intravenously.  The patient's vital signs were monitored continuously by radiology nursing throughout the course of the procedure.  Total sedation time: 25 minutes  Fluoroscopy Time: 30 seconds  Procedure:  The right neck and chest was prepped with chlorhexidine, and draped in the usual sterile fashion using maximum barrier technique (cap and mask, sterile gown, sterile gloves, large sterile sheet, hand hygiene and cutaneous antiseptic).  Antibiotic prophylaxis was provided with twog Ancef administered IV one hour prior to skin incision.  Local anesthesia was attained by infiltration with 1% lidocaine with epinephrine.  Ultrasound demonstrated patency of the right internal jugular vein, and this was documented with an image.  Under real-time ultrasound guidance, this vein was  accessed with a 21 gauge micropuncture needle and image documentation was performed.  A small dermatotomy was made at the access site with an 11 scalpel.  A 0.018" wire was advanced into the SVC and the access needle exchanged for a 43F micropuncture vascular sheath.  The 0.018" wire was then removed and a 0.035" wire advanced into the IVC.  An appropriate location for the subcutaneous reservoir was selected below the clavicle and an incision was made through the skin and underlying soft tissues.  The subcutaneous tissues were then dissected using a combination of blunt and sharp surgical technique and a pocket was formed.  A single lumen power injectable portacatheter was then tunneled through the subcutaneous tissues from the pocket to the dermatotomy and the port reservoir placed within the subcutaneous pocket.  The venous access site was then serially dilated and a peel away vascular sheath placed over the wire.  The wire was removed and the port catheter advanced into position under fluoroscopic guidance. The catheter tip is positioned in the superior cavoatrial  junction. This was documented with a spot image. The portacatheter was then tested and found to flush and aspirate well.  The port was flushed with saline followed by 100 units/mL heparinized saline.  The pocket was then closed in two layers using first subdermal inverted interrupted absorbable sutures followed by a running subcuticular suture.  The epidermis was then sealed with Dermabond. The dermatotomy at the venous access site was also closed with a single inverted subdermal suture and the epidermis sealed with Dermabond.  Complications:  None.  The patient tolerated the procedure well.  IMPRESSION:  Successful placement of a right IJapproach PowerPort with ultrasound and fluoroscopic guidance.  The catheter is ready for use.  Signed,  Sterling Big, MD Vascular & Interventional Radiologist [facility]   Original Report Authenticated  By: Malachy Moan, M.D.    ASSESSMENT AND PLAN: This is a very pleasant 57 years old Hispanic female was metastatic cholangiocarcinoma currently undergoing systemic chemotherapy with cisplatin and gemcitabine is status post 2 cycles. Overall she's tolerating her chemotherapy relatively well with no significant side effects. She will proceed with cycle #3 today as scheduled. Patient was discussed with also seen by Dr. Arbutus Ped. She'll followup with Dr. Arbutus Ped in 3 weeks with a restaging CT scan of her chest, abdomen and pelvis with contrast to reevaluate her disease.  Tammy Flow E, PA-C   She was advised to call immediately she has any concerning symptoms in the interval.  All questions were answered. The patient knows to call the clinic with any problems, questions or concerns. We can certainly see the patient much sooner if necessary.  ADDENDUM: Hematology/Oncology Attending: I have a face to face encounter with the patient today. I recommended her care plan. The patient came to the clinic today accompanied by her husband and her Spanish interpreter. She has metastatic cholangiocarcinoma and currently undergoing systemic chemotherapy with cisplatin and gemcitabine and tolerating it fairly well. She status post 2 cycles. We'll proceed with cycle #3 today as scheduled. The patient would come back for follow up visit in 3 weeks with repeat CT scan of the chest, abdomen and pelvis for restaging of her disease. She was advised to call immediately if she has any concerning symptoms in the interval. Lajuana Matte., MD 07/05/2013

## 2013-07-05 NOTE — Patient Instructions (Addendum)
Continue lab and chemotherapy as scheduled Follow up with Dr. Arbutus Ped in 3 weeks

## 2013-07-06 ENCOUNTER — Encounter: Payer: Self-pay | Admitting: Physician Assistant

## 2013-07-11 ENCOUNTER — Ambulatory Visit (HOSPITAL_BASED_OUTPATIENT_CLINIC_OR_DEPARTMENT_OTHER): Payer: Managed Care, Other (non HMO)

## 2013-07-11 ENCOUNTER — Ambulatory Visit: Payer: Self-pay

## 2013-07-11 ENCOUNTER — Other Ambulatory Visit (HOSPITAL_BASED_OUTPATIENT_CLINIC_OR_DEPARTMENT_OTHER): Payer: Managed Care, Other (non HMO) | Admitting: Lab

## 2013-07-11 VITALS — BP 138/90 | HR 88 | Temp 98.3°F | Resp 19 | Ht 61.0 in

## 2013-07-11 DIAGNOSIS — C221 Intrahepatic bile duct carcinoma: Secondary | ICD-10-CM

## 2013-07-11 DIAGNOSIS — Z5111 Encounter for antineoplastic chemotherapy: Secondary | ICD-10-CM

## 2013-07-11 DIAGNOSIS — C787 Secondary malignant neoplasm of liver and intrahepatic bile duct: Secondary | ICD-10-CM

## 2013-07-11 LAB — CBC WITH DIFFERENTIAL/PLATELET
EOS%: 0.2 % (ref 0.0–7.0)
Eosinophils Absolute: 0 10*3/uL (ref 0.0–0.5)
LYMPH%: 26.5 % (ref 14.0–49.7)
MCH: 29.6 pg (ref 25.1–34.0)
MCV: 85 fL (ref 79.5–101.0)
MONO%: 9 % (ref 0.0–14.0)
NEUT#: 2.9 10*3/uL (ref 1.5–6.5)
Platelets: 261 10*3/uL (ref 145–400)
RBC: 4.32 10*6/uL (ref 3.70–5.45)
nRBC: 0 % (ref 0–0)

## 2013-07-11 LAB — COMPREHENSIVE METABOLIC PANEL (CC13)
ALT: 26 U/L (ref 0–55)
Alkaline Phosphatase: 139 U/L (ref 40–150)
CO2: 25 mEq/L (ref 22–29)
Creatinine: 0.6 mg/dL (ref 0.6–1.1)
Glucose: 129 mg/dl (ref 70–140)
Total Bilirubin: 0.31 mg/dL (ref 0.20–1.20)

## 2013-07-11 MED ORDER — PROCHLORPERAZINE MALEATE 10 MG PO TABS
10.0000 mg | ORAL_TABLET | Freq: Once | ORAL | Status: AC
  Administered 2013-07-11: 10 mg via ORAL

## 2013-07-11 MED ORDER — SODIUM CHLORIDE 0.9 % IV SOLN
Freq: Once | INTRAVENOUS | Status: AC
  Administered 2013-07-11: 10:00:00 via INTRAVENOUS

## 2013-07-11 MED ORDER — SODIUM CHLORIDE 0.9 % IJ SOLN
10.0000 mL | INTRAMUSCULAR | Status: DC | PRN
  Administered 2013-07-11: 10 mL
  Filled 2013-07-11: qty 10

## 2013-07-11 MED ORDER — HEPARIN SOD (PORK) LOCK FLUSH 100 UNIT/ML IV SOLN
500.0000 [IU] | Freq: Once | INTRAVENOUS | Status: AC | PRN
  Administered 2013-07-11: 500 [IU]
  Filled 2013-07-11: qty 5

## 2013-07-11 MED ORDER — SODIUM CHLORIDE 0.9 % IV SOLN
800.0000 mg/m2 | Freq: Once | INTRAVENOUS | Status: AC
  Administered 2013-07-11: 1216 mg via INTRAVENOUS
  Filled 2013-07-11: qty 31.98

## 2013-07-11 NOTE — Patient Instructions (Addendum)
[facility] Discharge Instructions for Patients Receiving Chemotherapy  Today you received the following chemotherapy agents :  Gemcitabine.  To help prevent nausea and vomiting after your treatment, we encourage you to take your nausea medication as instructed by your physician.   If you develop nausea and vomiting that is not controlled by your nausea medication, call the clinic.   BELOW ARE SYMPTOMS THAT SHOULD BE REPORTED IMMEDIATELY:  *FEVER GREATER THAN 100.5 F  *CHILLS WITH OR WITHOUT FEVER  NAUSEA AND VOMITING THAT IS NOT CONTROLLED WITH YOUR NAUSEA MEDICATION  *UNUSUAL SHORTNESS OF BREATH  *UNUSUAL BRUISING OR BLEEDING  TENDERNESS IN MOUTH AND THROAT WITH OR WITHOUT PRESENCE OF ULCERS  *URINARY PROBLEMS  *BOWEL PROBLEMS  UNUSUAL RASH Items with * indicate a potential emergency and should be followed up as soon as possible.  Feel free to call the clinic you have any questions or concerns. The clinic phone number is [phone].    

## 2013-07-18 ENCOUNTER — Other Ambulatory Visit: Payer: Self-pay

## 2013-07-20 ENCOUNTER — Ambulatory Visit (HOSPITAL_COMMUNITY)
Admission: RE | Admit: 2013-07-20 | Discharge: 2013-07-20 | Disposition: A | Discharge status: Home / Self Care | Payer: Managed Care, Other (non HMO) | Source: Ambulatory Visit | Attending: Physician Assistant | Admitting: Physician Assistant

## 2013-07-20 DIAGNOSIS — K828 Other specified diseases of gallbladder: Secondary | ICD-10-CM | POA: Insufficient documentation

## 2013-07-20 DIAGNOSIS — C772 Secondary and unspecified malignant neoplasm of intra-abdominal lymph nodes: Secondary | ICD-10-CM | POA: Insufficient documentation

## 2013-07-20 DIAGNOSIS — Z9221 Personal history of antineoplastic chemotherapy: Secondary | ICD-10-CM | POA: Insufficient documentation

## 2013-07-20 DIAGNOSIS — C221 Intrahepatic bile duct carcinoma: Secondary | ICD-10-CM | POA: Insufficient documentation

## 2013-07-20 MED ORDER — IOHEXOL 300 MG/ML  SOLN
80.0000 mL | Freq: Once | INTRAMUSCULAR | Status: AC | PRN
  Administered 2013-07-20: 80 mL via INTRAVENOUS

## 2013-07-25 ENCOUNTER — Encounter: Payer: Self-pay | Admitting: Physician Assistant

## 2013-07-25 ENCOUNTER — Ambulatory Visit (HOSPITAL_BASED_OUTPATIENT_CLINIC_OR_DEPARTMENT_OTHER): Payer: Managed Care, Other (non HMO) | Admitting: Internal Medicine

## 2013-07-25 ENCOUNTER — Other Ambulatory Visit (HOSPITAL_BASED_OUTPATIENT_CLINIC_OR_DEPARTMENT_OTHER): Payer: Managed Care, Other (non HMO) | Admitting: Lab

## 2013-07-25 ENCOUNTER — Telehealth: Payer: Self-pay | Admitting: Internal Medicine

## 2013-07-25 ENCOUNTER — Encounter: Payer: Self-pay | Admitting: Internal Medicine

## 2013-07-25 ENCOUNTER — Ambulatory Visit (HOSPITAL_BASED_OUTPATIENT_CLINIC_OR_DEPARTMENT_OTHER): Payer: Managed Care, Other (non HMO)

## 2013-07-25 VITALS — BP 148/101 | HR 114 | Temp 97.7°F | Resp 19 | Ht 61.0 in | Wt 112.9 lb

## 2013-07-25 DIAGNOSIS — C221 Intrahepatic bile duct carcinoma: Secondary | ICD-10-CM

## 2013-07-25 DIAGNOSIS — C787 Secondary malignant neoplasm of liver and intrahepatic bile duct: Secondary | ICD-10-CM

## 2013-07-25 DIAGNOSIS — Z5111 Encounter for antineoplastic chemotherapy: Secondary | ICD-10-CM

## 2013-07-25 DIAGNOSIS — H9319 Tinnitus, unspecified ear: Secondary | ICD-10-CM

## 2013-07-25 LAB — CBC WITH DIFFERENTIAL/PLATELET
Basophils Absolute: 0 10*3/uL (ref 0.0–0.1)
Eosinophils Absolute: 0 10*3/uL (ref 0.0–0.5)
HGB: 11.9 g/dL (ref 11.6–15.9)
MCV: 88 fL (ref 79.5–101.0)
MONO%: 17 % — ABNORMAL HIGH (ref 0.0–14.0)
NEUT#: 2.9 10*3/uL (ref 1.5–6.5)
RBC: 3.93 10*6/uL (ref 3.70–5.45)
RDW: 16.8 % — ABNORMAL HIGH (ref 11.2–14.5)
WBC: 4.9 10*3/uL (ref 3.9–10.3)
lymph#: 1.1 10*3/uL (ref 0.9–3.3)
nRBC: 0 % (ref 0–0)

## 2013-07-25 LAB — COMPREHENSIVE METABOLIC PANEL (CC13)
ALT: 11 U/L (ref 0–55)
AST: 20 U/L (ref 5–34)
CO2: 24 mEq/L (ref 22–29)
Calcium: 9.6 mg/dL (ref 8.4–10.4)
Chloride: 101 mEq/L (ref 98–109)
Creatinine: 0.5 mg/dL — ABNORMAL LOW (ref 0.6–1.1)
Sodium: 135 mEq/L — ABNORMAL LOW (ref 136–145)
Total Bilirubin: 0.32 mg/dL (ref 0.20–1.20)
Total Protein: 7 g/dL (ref 6.4–8.3)

## 2013-07-25 LAB — MAGNESIUM (CC13): Magnesium: 2 mg/dl (ref 1.5–2.5)

## 2013-07-25 MED ORDER — HEPARIN SOD (PORK) LOCK FLUSH 100 UNIT/ML IV SOLN
500.0000 [IU] | Freq: Once | INTRAVENOUS | Status: AC | PRN
  Administered 2013-07-25: 500 [IU]
  Filled 2013-07-25: qty 5

## 2013-07-25 MED ORDER — SODIUM CHLORIDE 0.9 % IV SOLN
800.0000 mg/m2 | Freq: Once | INTRAVENOUS | Status: AC
  Administered 2013-07-25: 1216 mg via INTRAVENOUS
  Filled 2013-07-25: qty 31.98

## 2013-07-25 MED ORDER — DEXAMETHASONE SODIUM PHOSPHATE 20 MG/5ML IJ SOLN
12.0000 mg | Freq: Once | INTRAMUSCULAR | Status: AC
  Administered 2013-07-25: 12 mg via INTRAVENOUS

## 2013-07-25 MED ORDER — POTASSIUM CHLORIDE 2 MEQ/ML IV SOLN
Freq: Once | INTRAVENOUS | Status: AC
  Administered 2013-07-25: 11:00:00 via INTRAVENOUS
  Filled 2013-07-25: qty 10

## 2013-07-25 MED ORDER — PALONOSETRON HCL INJECTION 0.25 MG/5ML
0.2500 mg | Freq: Once | INTRAVENOUS | Status: AC
  Administered 2013-07-25: 0.25 mg via INTRAVENOUS

## 2013-07-25 MED ORDER — SODIUM CHLORIDE 0.9 % IJ SOLN
10.0000 mL | INTRAMUSCULAR | Status: DC | PRN
  Administered 2013-07-25: 10 mL
  Filled 2013-07-25: qty 10

## 2013-07-25 MED ORDER — SODIUM CHLORIDE 0.9 % IV SOLN
INTRAVENOUS | Status: DC
  Administered 2013-07-25: 10:00:00 via INTRAVENOUS

## 2013-07-25 MED ORDER — SODIUM CHLORIDE 0.9 % IV SOLN
80.0000 mg/m2 | Freq: Once | INTRAVENOUS | Status: AC
  Administered 2013-07-25: 120 mg via INTRAVENOUS
  Filled 2013-07-25: qty 120

## 2013-07-25 MED ORDER — SODIUM CHLORIDE 0.9 % IV SOLN
150.0000 mg | Freq: Once | INTRAVENOUS | Status: AC
  Administered 2013-07-25: 150 mg via INTRAVENOUS
  Filled 2013-07-25: qty 5

## 2013-07-25 NOTE — Telephone Encounter (Signed)
Added lb/fu for 9/24 and gv pt husband appt schedule for September. Per 9/3 pof chemo as ordered - pt has no chemo orders beyond 9/10 which is already scheduled. Message to MM to see if pt needs more chemo and orders. Pt aware he can get new schedule @ 9/10 appt.

## 2013-07-25 NOTE — Patient Instructions (Signed)
Your scan showed improvement in your disease. Continue systemic chemotherapy as scheduled. Followup visit in 3 weeks with the next cycle of her treatment

## 2013-07-25 NOTE — Progress Notes (Signed)
[facility] [phone]   Fax:(336) [phone]  OFFICE PROGRESS NOTE  Lora Paula, MD 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   CURRENT THERAPY: Systemic chemotherapy with cisplatin 100 mg/M2 on day 1 and gemcitabine 800 mg/M2 on days 1 and 8 every 3 weeks. First cycle on 05/23/2013. Status post 3 cycles.  INTERVAL HISTORY: Tammy [name] 57 y.o. female returns to the clinic today for followup visit accompanied by her husband and her Spanish interpreter. The patient tolerated the last cycle of her treatment fairly well except for some ringing in her ear but no significant peripheral neuropathy. She has mild nausea one week after her treatment results with Compazine. She denied having any significant weight loss or night sweats. She denied having any fever or chills. The patient denied having any significant chest pain, shortness breath, cough or hemoptysis. She had repeat CT scan of the chest, abdomen and pelvis performed recently and she is here for evaluation and discussion of her scan results.  MEDICAL HISTORY: Past Medical History  Diagnosis Date  . Arthritis   . Polyp of colon 11/14/2008    seen on colonoscopy, f/u colonoscopy recommended in 5 years.   . 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  MEDICATIONS:  Current Outpatient Prescriptions  Medication Sig Dispense Refill  . lidocaine-prilocaine (EMLA) cream Apply topically as needed. Apply to port 1 hr before chemo  30 g  0  . prochlorperazine (COMPAZINE) 10 MG tablet Take 1 tablet (10 mg total) by mouth every 6 (six) hours as needed (nausa and vomiting).  30 tablet  1   No current facility-administered medications for this visit.    SURGICAL HISTORY:  Past Surgical History  Procedure Laterality Date  . Abdominal hysterectomy  01/24/2007    including cervix,  benign    REVIEW OF SYSTEMS:  A comprehensive review of systems was negative except for: Ears, nose, mouth, throat, and face: positive for Ringing in her ears   PHYSICAL EXAMINATION: General appearance: alert, cooperative and no distress Head: Normocephalic, without obvious abnormality, atraumatic Neck: no adenopathy Lymph nodes: Cervical, supraclavicular, and axillary nodes normal. Resp: clear to auscultation bilaterally Back: symmetric, no curvature. ROM normal. No CVA tenderness. Cardio: regular rate and rhythm, S1, S2 normal, no murmur, click, rub or gallop GI: soft, non-tender; bowel sounds normal; no masses,  no organomegaly Extremities: extremities normal, atraumatic, no cyanosis or edema Neurologic: Alert and oriented X 3, normal strength and tone. Normal symmetric reflexes. Normal coordination and gait  ECOG PERFORMANCE STATUS: 1 - Symptomatic but completely ambulatory  Blood pressure 148/101, pulse 114, temperature 97.7 F (36.5 C), temperature source Oral, resp. rate 19, height 5\' 1"  (1.549 m), weight 112 lb 14.4 oz (51.211 kg), SpO2 100.00%.  LABORATORY DATA: Lab Results  Component Value Date   WBC 4.9 07/25/2013   HGB 11.9 07/25/2013   HCT 34.6* 07/25/2013   MCV 88.0 07/25/2013   PLT 300 07/25/2013      Chemistry      Component Value Date/Time   NA 130* 07/11/2013 0901   NA 135 02/20/2013 1640   K 4.4 07/11/2013 0901   K 3.9 02/20/2013 1640   CL 95* 05/10/2013 1249   CL 95* 02/20/2013 1640   CO2 25 07/11/2013 0901   CO2 31 02/20/2013 1640   BUN 12.8 07/11/2013 0901   BUN  12 02/20/2013 1640   CREATININE 0.6 07/11/2013 0901   CREATININE 0.39* 02/20/2013 1640   CREATININE 0.54 06/24/2010 1951      Component Value Date/Time   CALCIUM 9.4 07/11/2013 0901   CALCIUM 10.1 02/20/2013 1640   ALKPHOS 139 07/11/2013 0901   ALKPHOS 155* 02/20/2013 1640   AST 35* 07/11/2013 0901   AST 29 02/20/2013 1640   ALT 26 07/11/2013 0901   ALT 17 02/20/2013 1640   BILITOT 0.31 07/11/2013 0901   BILITOT 0.5  02/20/2013 1640       RADIOGRAPHIC STUDIES: Ct Chest W Contrast  07/20/2013   *RADIOLOGY REPORT*  Clinical Data:  Restaging of cholangiocarcinoma.  Ongoing chemotherapy.  CT CHEST, ABDOMEN AND PELVIS WITH CONTRAST  Technique: Contiguous axial images of the chest abdomen and pelvis were obtained after IV contrast administration.  Contrast: 80 ml Omnipaque-300  Comparison: 01/15/2013   CT CHEST  Findings: Lung windows demonstrate similar minimal thickening along the right minor fissure on image 25/series 7. Clear left lung.  Soft tissue windows demonstrate no supraclavicular adenopathy.  A right-sided Port-A-Cath which terminates at the high right atrium. Normal heart size without pericardial or pleural effusion.  No central pulmonary embolism, on this non-dedicated study.  The previously described probable right lower lobe pulmonary emboli are not readily apparent.  No mediastinal or hilar adenopathy.  IMPRESSION:  1. No acute process or evidence of metastatic disease in the chest. 2.  Previously described suspected right lower lobe pulmonary emboli are not readily apparent.    CT ABDOMEN AND PELVIS  Findings:  Suboptimal bolus timing on attempted arterial phase images.  Redemonstration of a dominant mass within the superior aspect of the anterior segment right and medial segment left liver lobes.  Measures 7.1 x 10.4 cm on image 27/series 2 versus 7.9 x 12.6 cm on the prior exam.  Posterior segment right liver lobe 1.1 cm lesion on image 24/series 2 measured 1.0 cm on the prior. Dominant mass in the pericholecystic region measures 7.4 x 6.7 cm on image 42/series 4 versus 9.4 x 6.9 cm on the prior exam.  Normal spleen, stomach, pancreas.  Contracted gallbladder with minimal calcification in its fundus.  No acute cholecystitis or biliary ductal dilatation.  Normal adrenal glands and kidneys.  Retroperitoneal adenopathy.  Left para-aortic index node measures 1.6 x 1.0 cm today versus 1.9 x 1.0 cm on the prior  exam. A preaortic node measures 1.2 by 1.8 cm today versus 1.2 x 1.9 cm on the prior exam.  A hepatoduodenal ligament node measures 1.2 cm on image 98 versus 1.6 cm on the prior.  Normal colon, appendix, and terminal ileum.  Normal small bowel without abdominal ascites.    No evidence of omental or peritoneal disease.  No pelvic adenopathy.  Hysterectomy.  Normal urinary bladder.  No adnexal mass.  Decrease in small volume cul-de-sac fluid.  No acute osseous abnormality.  IMPRESSION:  1.  Slight improvement in hepatic disease burden. 2. Mild decreased in abdominal nodal metastasis. 3.  No evidence of new or progressive disease. 4.  Decrease in small volume cul-de-sac fluid. 4.  Contracted gallbladder with either wall calcification or fundal stone.  No evidence of acute cholecystitis.   Original Report Authenticated By: Jeronimo Greaves, M.D.   ASSESSMENT AND PLAN: This is a very pleasant 57 years old Hispanic female with metastatic cholangiocarcinoma status post 3 cycle of systemic chemotherapy with cisplatin and gemcitabine with partial response. I discussed the scan results and showed the images  to the patient and her family today. I recommended for her to continue treatment with the same treatment regimen with cisplatin and gemcitabine but I would reduce the dose of cisplatin to 80 mg/M2 because of the ringing in her ears and to avoid any further hearing loss.  The patient will start cycle #4 of her chemotherapy today. She would come back for followup visit in 3 weeks with the next cycle of her treatment. She was advised to call immediately if she has any concerning symptoms in the interval.  The patient voices understanding of current disease status and treatment options and is in agreement with the current care plan.  All questions were answered. The patient knows to call the clinic with any problems, questions or concerns. We can certainly see the patient much sooner if necessary.  I spent 15 minutes  counseling the patient face to face. The total time spent in the appointment was 25 minutes.

## 2013-07-25 NOTE — Patient Instructions (Signed)
[facility] Discharge Instructions for Patients Receiving Chemotherapy  Today you received the following chemotherapy agents Gemzar/Cisplatin To help prevent nausea and vomiting after your treatment, we encourage you to take your nausea medication as prescribed.  If you develop nausea and vomiting that is not controlled by your nausea medication, call the clinic.   BELOW ARE SYMPTOMS THAT SHOULD BE REPORTED IMMEDIATELY:  *FEVER GREATER THAN 100.5 F  *CHILLS WITH OR WITHOUT FEVER  NAUSEA AND VOMITING THAT IS NOT CONTROLLED WITH YOUR NAUSEA MEDICATION  *UNUSUAL SHORTNESS OF BREATH  *UNUSUAL BRUISING OR BLEEDING  TENDERNESS IN MOUTH AND THROAT WITH OR WITHOUT PRESENCE OF ULCERS  *URINARY PROBLEMS  *BOWEL PROBLEMS  UNUSUAL RASH Items with * indicate a potential emergency and should be followed up as soon as possible.  Feel free to call the clinic you have any questions or concerns. The clinic phone number is [phone].    

## 2013-08-01 ENCOUNTER — Other Ambulatory Visit: Payer: Self-pay

## 2013-08-01 ENCOUNTER — Ambulatory Visit (HOSPITAL_BASED_OUTPATIENT_CLINIC_OR_DEPARTMENT_OTHER): Payer: Managed Care, Other (non HMO)

## 2013-08-01 ENCOUNTER — Other Ambulatory Visit (HOSPITAL_BASED_OUTPATIENT_CLINIC_OR_DEPARTMENT_OTHER): Payer: Managed Care, Other (non HMO) | Admitting: Lab

## 2013-08-01 VITALS — BP 144/94 | HR 95 | Temp 97.2°F | Resp 16

## 2013-08-01 DIAGNOSIS — C221 Intrahepatic bile duct carcinoma: Secondary | ICD-10-CM

## 2013-08-01 DIAGNOSIS — Z5111 Encounter for antineoplastic chemotherapy: Secondary | ICD-10-CM

## 2013-08-01 LAB — COMPREHENSIVE METABOLIC PANEL (CC13)
ALT: 22 U/L (ref 0–55)
Albumin: 3.9 g/dL (ref 3.5–5.0)
Alkaline Phosphatase: 121 U/L (ref 40–150)
CO2: 29 mEq/L (ref 22–29)
Potassium: 4 mEq/L (ref 3.5–5.1)
Sodium: 132 mEq/L — ABNORMAL LOW (ref 136–145)
Total Bilirubin: 0.32 mg/dL (ref 0.20–1.20)
Total Protein: 7.1 g/dL (ref 6.4–8.3)

## 2013-08-01 LAB — CBC WITH DIFFERENTIAL/PLATELET
BASO%: 0.4 % (ref 0.0–2.0)
EOS%: 0.3 % (ref 0.0–7.0)
HCT: 31 % — ABNORMAL LOW (ref 34.8–46.6)
LYMPH%: 32.7 % (ref 14.0–49.7)
MCH: 30.9 pg (ref 25.1–34.0)
MCHC: 34.9 g/dL (ref 31.5–36.0)
MONO#: 0.2 10*3/uL (ref 0.1–0.9)
NEUT%: 58 % (ref 38.4–76.8)
Platelets: 332 10*3/uL (ref 145–400)

## 2013-08-01 MED ORDER — ONDANSETRON 8 MG/NS 50 ML IVPB
INTRAVENOUS | Status: AC
  Filled 2013-08-01: qty 8

## 2013-08-01 MED ORDER — PROCHLORPERAZINE MALEATE 10 MG PO TABS: 10.0000 mg | ORAL_TABLET | Freq: Once | ORAL | Status: DC

## 2013-08-01 MED ORDER — HEPARIN SOD (PORK) LOCK FLUSH 100 UNIT/ML IV SOLN
500.0000 [IU] | Freq: Once | INTRAVENOUS | Status: AC | PRN
  Administered 2013-08-01: 500 [IU]
  Filled 2013-08-01: qty 5

## 2013-08-01 MED ORDER — ONDANSETRON 8 MG/50ML IVPB (CHCC)
8.0000 mg | Freq: Once | INTRAVENOUS | Status: AC
  Administered 2013-08-01: 8 mg via INTRAVENOUS

## 2013-08-01 MED ORDER — SODIUM CHLORIDE 0.9 % IV SOLN
800.0000 mg/m2 | Freq: Once | INTRAVENOUS | Status: AC
  Administered 2013-08-01: 1216 mg via INTRAVENOUS
  Filled 2013-08-01: qty 31.98

## 2013-08-01 MED ORDER — ONDANSETRON HCL 8 MG PO TABS: 8.0000 mg | ORAL_TABLET | Freq: Three times a day (TID) | ORAL | Status: DC | PRN

## 2013-08-01 MED ORDER — SODIUM CHLORIDE 0.9 % IJ SOLN
10.0000 mL | INTRAMUSCULAR | Status: DC | PRN
Start: 2013-08-01 — End: 2013-08-01
  Administered 2013-08-01: 10 mL
  Filled 2013-08-01: qty 10

## 2013-08-01 MED ORDER — PROCHLORPERAZINE MALEATE 10 MG PO TABS
ORAL_TABLET | ORAL | Status: AC
  Administered 2013-08-01: 10 mg
  Filled 2013-08-01: qty 1

## 2013-08-01 MED ORDER — SODIUM CHLORIDE 0.9 % IV SOLN
Freq: Once | INTRAVENOUS | Status: AC
  Administered 2013-08-01: 10:00:00 via INTRAVENOUS

## 2013-08-01 NOTE — Progress Notes (Signed)
OK to tx w/ANC 1.2 and WBC 2.0 per Dr Arbutus Ped. 1120 pt nauseated and vomited small amnt. Dr Arbutus Ped informed and zofran order received. Also zofran Rx to pharmacy per Dr Arbutus Ped. Explained to pt and husband how to alternate zofran and compazine if needed. 1200 pt no longer nauseated.

## 2013-08-01 NOTE — Patient Instructions (Addendum)
[facility] Discharge Instructions for Patients Receiving Chemotherapy  Today you received the following chemotherapy agents GEMZAR To help prevent nausea and vomiting after your treatment, we encourage you to take your nausea medication THIS AFTERNOON ABOUT 4 PM IF NEEDED.   If you develop nausea and vomiting that is not controlled by your nausea medication, call the clinic.   BELOW ARE SYMPTOMS THAT SHOULD BE REPORTED IMMEDIATELY:  *FEVER GREATER THAN 100.5 F  *CHILLS WITH OR WITHOUT FEVER  NAUSEA AND VOMITING THAT IS NOT CONTROLLED WITH YOUR NAUSEA MEDICATION  *UNUSUAL SHORTNESS OF BREATH  *UNUSUAL BRUISING OR BLEEDING  TENDERNESS IN MOUTH AND THROAT WITH OR WITHOUT PRESENCE OF ULCERS  *URINARY PROBLEMS  *BOWEL PROBLEMS  UNUSUAL RASH Items with * indicate a potential emergency and should be followed up as soon as possible.  Feel free to call the clinic you have any questions or concerns. The clinic phone number is [phone].

## 2013-08-03 ENCOUNTER — Telehealth: Payer: Self-pay | Admitting: Internal Medicine

## 2013-08-03 NOTE — Telephone Encounter (Signed)
Per 9/3 response from MM additional chemo orders added. Pt already has existing appt for 9/10 and 9/24. Added additional wkly lab starting 9/17 and tx appts starting 9/24.lmonvm for pt via [employer] [phone]) re next appt for 9/17 and that pt to get schedule when she comes in. Also mailed September/October schedule.

## 2013-08-08 ENCOUNTER — Telehealth: Payer: Self-pay | Admitting: Internal Medicine

## 2013-08-08 ENCOUNTER — Other Ambulatory Visit: Payer: Self-pay | Admitting: Lab

## 2013-08-08 NOTE — Telephone Encounter (Signed)
pt husband came by and got calendar for October and September, labs for today r/s to tomorrow 9/18

## 2013-08-09 ENCOUNTER — Other Ambulatory Visit (HOSPITAL_BASED_OUTPATIENT_CLINIC_OR_DEPARTMENT_OTHER): Payer: Managed Care, Other (non HMO)

## 2013-08-09 DIAGNOSIS — C221 Intrahepatic bile duct carcinoma: Secondary | ICD-10-CM

## 2013-08-09 LAB — COMPREHENSIVE METABOLIC PANEL (CC13)
ALT: 13 U/L (ref 0–55)
AST: 21 U/L (ref 5–34)
Alkaline Phosphatase: 111 U/L (ref 40–150)
Calcium: 9.8 mg/dL (ref 8.4–10.4)
Chloride: 97 mEq/L — ABNORMAL LOW (ref 98–109)
Creatinine: 0.6 mg/dL (ref 0.6–1.1)
Potassium: 4.5 mEq/L (ref 3.5–5.1)

## 2013-08-09 LAB — CBC WITH DIFFERENTIAL/PLATELET
BASO%: 0.1 % (ref 0.0–2.0)
EOS%: 0.1 % (ref 0.0–7.0)
MCH: 31 pg (ref 25.1–34.0)
MCHC: 35.1 g/dL (ref 31.5–36.0)
MCV: 88.4 fL (ref 79.5–101.0)
MONO%: 9.7 % (ref 0.0–14.0)
RDW: 17.2 % — ABNORMAL HIGH (ref 11.2–14.5)
lymph#: 0.8 10*3/uL — ABNORMAL LOW (ref 0.9–3.3)

## 2013-08-15 ENCOUNTER — Encounter: Payer: Self-pay | Admitting: Physician Assistant

## 2013-08-15 ENCOUNTER — Ambulatory Visit (HOSPITAL_BASED_OUTPATIENT_CLINIC_OR_DEPARTMENT_OTHER): Payer: Managed Care, Other (non HMO) | Admitting: Physician Assistant

## 2013-08-15 ENCOUNTER — Ambulatory Visit (HOSPITAL_BASED_OUTPATIENT_CLINIC_OR_DEPARTMENT_OTHER): Payer: Managed Care, Other (non HMO)

## 2013-08-15 ENCOUNTER — Other Ambulatory Visit (HOSPITAL_BASED_OUTPATIENT_CLINIC_OR_DEPARTMENT_OTHER): Payer: Managed Care, Other (non HMO) | Admitting: Lab

## 2013-08-15 ENCOUNTER — Encounter: Payer: Self-pay | Admitting: Internal Medicine

## 2013-08-15 ENCOUNTER — Telehealth: Payer: Self-pay | Admitting: Internal Medicine

## 2013-08-15 VITALS — BP 100/86 | HR 95 | Temp 98.6°F

## 2013-08-15 VITALS — BP 142/85 | HR 103 | Temp 97.6°F | Resp 18 | Ht 61.0 in | Wt 111.8 lb

## 2013-08-15 DIAGNOSIS — C221 Intrahepatic bile duct carcinoma: Secondary | ICD-10-CM

## 2013-08-15 DIAGNOSIS — Z5111 Encounter for antineoplastic chemotherapy: Secondary | ICD-10-CM

## 2013-08-15 DIAGNOSIS — R1011 Right upper quadrant pain: Secondary | ICD-10-CM

## 2013-08-15 DIAGNOSIS — R110 Nausea: Secondary | ICD-10-CM

## 2013-08-15 DIAGNOSIS — C787 Secondary malignant neoplasm of liver and intrahepatic bile duct: Secondary | ICD-10-CM

## 2013-08-15 DIAGNOSIS — R5381 Other malaise: Secondary | ICD-10-CM

## 2013-08-15 LAB — CBC WITH DIFFERENTIAL/PLATELET
Basophils Absolute: 0 10*3/uL (ref 0.0–0.1)
EOS%: 0.4 % (ref 0.0–7.0)
Eosinophils Absolute: 0 10*3/uL (ref 0.0–0.5)
HGB: 10.8 g/dL — ABNORMAL LOW (ref 11.6–15.9)
LYMPH%: 20.1 % (ref 14.0–49.7)
MCH: 31.1 pg (ref 25.1–34.0)
MCV: 89 fL (ref 79.5–101.0)
MONO%: 16.8 % — ABNORMAL HIGH (ref 0.0–14.0)
NEUT#: 2.9 10*3/uL (ref 1.5–6.5)
NEUT%: 62.5 % (ref 38.4–76.8)
Platelets: 255 10*3/uL (ref 145–400)
RDW: 17.8 % — ABNORMAL HIGH (ref 11.2–14.5)

## 2013-08-15 LAB — COMPREHENSIVE METABOLIC PANEL (CC13)
Alkaline Phosphatase: 103 U/L (ref 40–150)
BUN: 12 mg/dL (ref 7.0–26.0)
CO2: 25 mEq/L (ref 22–29)
Creatinine: 0.5 mg/dL — ABNORMAL LOW (ref 0.6–1.1)
Glucose: 114 mg/dl (ref 70–140)
Sodium: 132 mEq/L — ABNORMAL LOW (ref 136–145)
Total Bilirubin: 0.3 mg/dL (ref 0.20–1.20)
Total Protein: 6.8 g/dL (ref 6.4–8.3)

## 2013-08-15 MED ORDER — PALONOSETRON HCL INJECTION 0.25 MG/5ML
0.2500 mg | Freq: Once | INTRAVENOUS | Status: AC
  Administered 2013-08-15: 0.25 mg via INTRAVENOUS

## 2013-08-15 MED ORDER — POTASSIUM CHLORIDE 2 MEQ/ML IV SOLN
Freq: Once | INTRAVENOUS | Status: AC
  Administered 2013-08-15: 12:00:00 via INTRAVENOUS
  Filled 2013-08-15: qty 10

## 2013-08-15 MED ORDER — PALONOSETRON HCL INJECTION 0.25 MG/5ML
INTRAVENOUS | Status: AC
  Filled 2013-08-15: qty 5

## 2013-08-15 MED ORDER — SODIUM CHLORIDE 0.9 % IV SOLN
INTRAVENOUS | Status: DC
  Administered 2013-08-15: 11:00:00 via INTRAVENOUS

## 2013-08-15 MED ORDER — DEXAMETHASONE SODIUM PHOSPHATE 20 MG/5ML IJ SOLN
INTRAMUSCULAR | Status: AC
  Filled 2013-08-15: qty 5

## 2013-08-15 MED ORDER — SODIUM CHLORIDE 0.9 % IJ SOLN
10.0000 mL | INTRAMUSCULAR | Status: DC | PRN
  Administered 2013-08-15: 10 mL
  Filled 2013-08-15: qty 10

## 2013-08-15 MED ORDER — SODIUM CHLORIDE 0.9 % IV SOLN
800.0000 mg/m2 | Freq: Once | INTRAVENOUS | Status: AC
  Administered 2013-08-15: 1216 mg via INTRAVENOUS
  Filled 2013-08-15: qty 31.98

## 2013-08-15 MED ORDER — FOSAPREPITANT DIMEGLUMINE INJECTION 150 MG
150.0000 mg | Freq: Once | INTRAVENOUS | Status: AC
  Administered 2013-08-15: 150 mg via INTRAVENOUS
  Filled 2013-08-15: qty 5

## 2013-08-15 MED ORDER — HEPARIN SOD (PORK) LOCK FLUSH 100 UNIT/ML IV SOLN
500.0000 [IU] | Freq: Once | INTRAVENOUS | Status: AC | PRN
  Administered 2013-08-15: 500 [IU]
  Filled 2013-08-15: qty 5

## 2013-08-15 MED ORDER — DEXAMETHASONE SODIUM PHOSPHATE 20 MG/5ML IJ SOLN
12.0000 mg | Freq: Once | INTRAMUSCULAR | Status: AC
  Administered 2013-08-15: 12 mg via INTRAVENOUS

## 2013-08-15 MED ORDER — SODIUM CHLORIDE 0.9 % IV SOLN
80.0000 mg/m2 | Freq: Once | INTRAVENOUS | Status: AC
  Administered 2013-08-15: 120 mg via INTRAVENOUS
  Filled 2013-08-15: qty 120

## 2013-08-15 NOTE — Telephone Encounter (Signed)
Pt's husband came by and get appt calendar for october 2014

## 2013-08-15 NOTE — Progress Notes (Signed)
Pt voided 700 cc before cisplatin and 500 cc after cisplatin.  1405 Pt vomited when told we are giving her the nausea medication. She agreed that the n/v was from anxiety. Her husband agreed that she vomits when anxious. No vomiting after that one episode.

## 2013-08-15 NOTE — Patient Instructions (Addendum)
[facility] Discharge Instructions for Patients Receiving Chemotherapy  Today you received the following chemotherapy agents Cisplatin/Gemzar To help prevent nausea and vomiting after your treatment, we encourage you to take your nausea medication (Zofran and Compazine) as per Dr. Arbutus Ped. If you develop nausea and vomiting that is not controlled by your nausea medication, call the clinic.   BELOW ARE SYMPTOMS THAT SHOULD BE REPORTED IMMEDIATELY:  *FEVER GREATER THAN 100.5 F  *CHILLS WITH OR WITHOUT FEVER  NAUSEA AND VOMITING THAT IS NOT CONTROLLED WITH YOUR NAUSEA MEDICATION  *UNUSUAL SHORTNESS OF BREATH  *UNUSUAL BRUISING OR BLEEDING  TENDERNESS IN MOUTH AND THROAT WITH OR WITHOUT PRESENCE OF ULCERS  *URINARY PROBLEMS  *BOWEL PROBLEMS  UNUSUAL RASH Items with * indicate a potential emergency and should be followed up as soon as possible.  Feel free to call the clinic you have any questions or concerns. The clinic phone number is [phone].

## 2013-08-15 NOTE — Progress Notes (Signed)
2650 total urine output

## 2013-08-16 NOTE — Patient Instructions (Signed)
You may use the Labfarve drops with divi div and Aramu but do not use the wild oregano drops Continue with your weekly labs as scheduled Followup with Dr. Arbutus Ped in 3 weeks, prior to next scheduled cycle of chemotherapy

## 2013-08-16 NOTE — Progress Notes (Addendum)
[facility] [phone]   Fax:([phone]  SHARED VISIT PROGRESS NOTE  Lora Paula, MD 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   CURRENT THERAPY: Systemic chemotherapy with cisplatin 100 mg/M2 on day 1 and gemcitabine 800 mg/M2 on days 1 and 8 every 3 weeks. First cycle on 05/23/2013. Status post 4 cycles.  INTERVAL HISTORY: Tammy [name] 57 y.o. female returns to the clinic today for followup visit accompanied by her husband. Overall she is tolerating her chemotherapy relatively well with the exception of some vomiting that occurs approximately 7 hours after chemotherapy. She also has some mild right upper quadrant pain and is also a occurs with vomiting but can be present without vomiting as well. She has no pain with eating and eating does not have any affect on the right upper quadrant pain. She brings in a [place] naturopathic equivalent drop of containing divi divi and Anamu in a low alcohol concentration. It is used as 2-3 drops under the tongue twice daily to help with generalized malaise and discomfort. She also has questions about taking some while are regular no drops. We will check these with our oncology pharmacist to see if there is any cross reactivity with her chemotherapy. The patient denied anysignificant peripheral neuropathy. She has mild nausea one week after her treatment as well as a few hours after chemotherapy with good results with Compazine. She denied having any significant weight loss or night sweats. She denied having any fever or chills. The patient denied having any significant chest pain, shortness breath, cough or hemoptysis.   MEDICAL HISTORY: Past Medical History  Diagnosis Date  . Arthritis   . Polyp of colon 11/14/2008    seen on colonoscopy, f/u colonoscopy recommended in 5 years.   . Tumor liver 05/01/13     ALLERGIES:  is allergic to tramadol.  MEDICATIONS:  Current Outpatient Prescriptions  Medication Sig Dispense Refill  . lidocaine-prilocaine (EMLA) cream Apply topically as needed. Apply to port 1 hr before chemo  30 g  0  . ondansetron (ZOFRAN) 8 MG tablet Take 1 tablet (8 mg total) by mouth every 8 (eight) hours as needed for nausea.  30 tablet  1  . prochlorperazine (COMPAZINE) 10 MG tablet Take 1 tablet (10 mg total) by mouth every 6 (six) hours as needed (nausa and vomiting).  30 tablet  1   No current facility-administered medications for this visit.    SURGICAL HISTORY:  Past Surgical History  Procedure Laterality Date  . Abdominal hysterectomy  01/24/2007    including cervix, benign    REVIEW OF SYSTEMS:  A comprehensive review of systems was negative except for: Gastrointestinal: positive for vomiting   PHYSICAL EXAMINATION: General appearance: alert, cooperative and no distress Head: Normocephalic, without obvious abnormality, atraumatic Neck: no adenopathy Lymph nodes: Cervical, supraclavicular, and axillary nodes normal. Resp: clear to auscultation bilaterally Back: symmetric, no curvature. ROM normal. No CVA tenderness. Cardio: regular rate and rhythm, S1, S2 normal, no murmur, click, rub or gallop GI: soft, non-tender; bowel sounds normal; no masses,  no organomegaly Extremities: extremities normal, atraumatic, no cyanosis or edema Neurologic: Alert and oriented X 3, normal strength and tone. Normal symmetric reflexes. Normal coordination and gait  ECOG PERFORMANCE STATUS: 1 - Symptomatic but completely ambulatory  Blood pressure 142/85, pulse 103, temperature 97.6 F (36.4 C), temperature source Oral, resp. rate 18, height  5\' 1"  (1.549 m), weight 111 lb 12.8 oz (50.712 kg), SpO2 98.00%.  LABORATORY DATA: Lab Results  Component Value Date   WBC 4.6 08/15/2013   HGB 10.8* 08/15/2013   HCT 30.9* 08/15/2013   MCV 89.0 08/15/2013   PLT 255 08/15/2013       Chemistry      Component Value Date/Time   NA 132* 08/15/2013 0906   NA 135 02/20/2013 1640   K 4.3 08/15/2013 0906   K 3.9 02/20/2013 1640   CL 95* 05/10/2013 1249   CL 95* 02/20/2013 1640   CO2 25 08/15/2013 0906   CO2 31 02/20/2013 1640   BUN 12.0 08/15/2013 0906   BUN 12 02/20/2013 1640   CREATININE 0.5* 08/15/2013 0906   CREATININE 0.39* 02/20/2013 1640   CREATININE 0.54 06/24/2010 1951      Component Value Date/Time   CALCIUM 9.1 08/15/2013 0906   CALCIUM 10.1 02/20/2013 1640   ALKPHOS 103 08/15/2013 0906   ALKPHOS 155* 02/20/2013 1640   AST 18 08/15/2013 0906   AST 29 02/20/2013 1640   ALT 6 08/15/2013 0906   ALT 17 02/20/2013 1640   BILITOT 0.30 08/15/2013 0906   BILITOT 0.5 02/20/2013 1640       RADIOGRAPHIC STUDIES: Ct Chest W Contrast  07/20/2013   *RADIOLOGY REPORT*  Clinical Data:  Restaging of cholangiocarcinoma.  Ongoing chemotherapy.  CT CHEST, ABDOMEN AND PELVIS WITH CONTRAST  Technique: Contiguous axial images of the chest abdomen and pelvis were obtained after IV contrast administration.  Contrast: 80 ml Omnipaque-300  Comparison: 01/15/2013   CT CHEST  Findings: Lung windows demonstrate similar minimal thickening along the right minor fissure on image 25/series 7. Clear left lung.  Soft tissue windows demonstrate no supraclavicular adenopathy.  A right-sided Port-A-Cath which terminates at the high right atrium. Normal heart size without pericardial or pleural effusion.  No central pulmonary embolism, on this non-dedicated study.  The previously described probable right lower lobe pulmonary emboli are not readily apparent.  No mediastinal or hilar adenopathy.  IMPRESSION:  1. No acute process or evidence of metastatic disease in the chest. 2.  Previously described suspected right lower lobe pulmonary emboli are not readily apparent.    CT ABDOMEN AND PELVIS  Findings:  Suboptimal bolus timing on attempted arterial phase images.  Redemonstration of a dominant mass within the superior aspect of  the anterior segment right and medial segment left liver lobes.  Measures 7.1 x 10.4 cm on image 27/series 2 versus 7.9 x 12.6 cm on the prior exam.  Posterior segment right liver lobe 1.1 cm lesion on image 24/series 2 measured 1.0 cm on the prior. Dominant mass in the pericholecystic region measures 7.4 x 6.7 cm on image 42/series 4 versus 9.4 x 6.9 cm on the prior exam.  Normal spleen, stomach, pancreas.  Contracted gallbladder with minimal calcification in its fundus.  No acute cholecystitis or biliary ductal dilatation.  Normal adrenal glands and kidneys.  Retroperitoneal adenopathy.  Left para-aortic index node measures 1.6 x 1.0 cm today versus 1.9 x 1.0 cm on the prior exam. A preaortic node measures 1.2 by 1.8 cm today versus 1.2 x 1.9 cm on the prior exam.  A hepatoduodenal ligament node measures 1.2 cm on image 98 versus 1.6 cm on the prior.  Normal colon, appendix, and terminal ileum.  Normal small bowel without abdominal ascites.    No evidence of omental or peritoneal disease.  No pelvic adenopathy.  Hysterectomy.  Normal urinary bladder.  No adnexal mass.  Decrease in small volume cul-de-sac fluid.  No acute osseous abnormality.  IMPRESSION:  1.  Slight improvement in hepatic disease burden. 2. Mild decreased in abdominal nodal metastasis. 3.  No evidence of new or progressive disease. 4.  Decrease in small volume cul-de-sac fluid. 4.  Contracted gallbladder with either wall calcification or fundal stone.  No evidence of acute cholecystitis.   Original Report Authenticated By: Jeronimo Greaves, M.D.   ASSESSMENT AND PLAN: This is a very pleasant 57 years old Hispanic female with metastatic cholangiocarcinoma status post 4 cycle of systemic chemotherapy with cisplatin and gemcitabine with partial response. The patient was discussed with also seen by Dr. Arbutus Ped. She will proceed with cycle #5 of her reduced dose systemic chemotherapy with cisplatin now at a dose of 80 mg per meter squared given on day 1  due to the ringing in her ears and avoid any further hearing loss and she will continue the gemcitabine at 800 mg per meter squared given on days 1 and 8 every 3 weeks. The Labfarve drops with divi divi and Anamu fairly checked by our oncology pharmacists and no negative interaction with her current chemotherapy was found. She may use these drops twice daily as needed for improvement of her generalized malaise. Was not recommended that she take the wild are regular no drops as the regular may compete for some of the same receptors or use similar pathways as some of her chemotherapy medications. The patient and her husband voiced understanding of these instructions. She will continue with weekly labs and return in 3 weeks prior to cycle #6 also with a repeat labs.  Laural Benes, [place] E, PA-C   She was advised to call immediately if she has any concerning symptoms in the interval.  The patient voices understanding of current disease status and treatment options and is in agreement with the current care plan.  All questions were answered. The patient knows to call the clinic with any problems, questions or concerns. We can certainly see the patient much sooner if necessary.  ADDENDUM: Hematology/Oncology Attending: I had a face to face encounter with the patient during his visit. I recommended her care plan. The patient has metastatic cholangiocarcinoma and currently undergoing systemic chemotherapy with cisplatin and gemcitabine status post 4 cycles. She is tolerating her treatment fairly well except for occasional nausea especially after the combination of cisplatin and gemcitabine on day 1 of the treatment. She denied having any significant chest pain, shortness breath, cough or hemoptysis. Will proceed with cycle #5 of her treatment today as scheduled. The patient would come back for followup visit in 3 weeks with the start of cycle #6. She was advised to call immediately she has any concerning symptoms  in the interval. Lajuana Matte., MD 08/19/2013

## 2013-08-22 ENCOUNTER — Other Ambulatory Visit (HOSPITAL_BASED_OUTPATIENT_CLINIC_OR_DEPARTMENT_OTHER): Payer: Managed Care, Other (non HMO) | Admitting: Lab

## 2013-08-22 ENCOUNTER — Other Ambulatory Visit: Payer: Self-pay | Admitting: Medical Oncology

## 2013-08-22 ENCOUNTER — Other Ambulatory Visit: Payer: Self-pay | Admitting: Physician Assistant

## 2013-08-22 ENCOUNTER — Ambulatory Visit (HOSPITAL_BASED_OUTPATIENT_CLINIC_OR_DEPARTMENT_OTHER): Payer: Managed Care, Other (non HMO)

## 2013-08-22 VITALS — BP 144/79 | HR 79 | Temp 98.5°F | Resp 18

## 2013-08-22 DIAGNOSIS — C221 Intrahepatic bile duct carcinoma: Secondary | ICD-10-CM

## 2013-08-22 DIAGNOSIS — Z5111 Encounter for antineoplastic chemotherapy: Secondary | ICD-10-CM

## 2013-08-22 LAB — COMPREHENSIVE METABOLIC PANEL (CC13)
ALT: 20 U/L (ref 0–55)
AST: 27 U/L (ref 5–34)
Albumin: 3.7 g/dL (ref 3.5–5.0)
Alkaline Phosphatase: 107 U/L (ref 40–150)
BUN: 9.5 mg/dL (ref 7.0–26.0)
CO2: 25 mEq/L (ref 22–29)
Glucose: 111 mg/dl (ref 70–140)
Potassium: 4.3 mEq/L (ref 3.5–5.1)
Sodium: 131 mEq/L — ABNORMAL LOW (ref 136–145)
Total Bilirubin: 0.23 mg/dL (ref 0.20–1.20)
Total Protein: 6.8 g/dL (ref 6.4–8.3)

## 2013-08-22 LAB — CBC WITH DIFFERENTIAL/PLATELET
Basophils Absolute: 0 10*3/uL (ref 0.0–0.1)
EOS%: 0.5 % (ref 0.0–7.0)
HCT: 27.3 % — ABNORMAL LOW (ref 34.8–46.6)
HGB: 9.6 g/dL — ABNORMAL LOW (ref 11.6–15.9)
MCH: 31.8 pg (ref 25.1–34.0)
MCHC: 35.2 g/dL (ref 31.5–36.0)
MONO#: 0.2 10*3/uL (ref 0.1–0.9)
MONO%: 7.3 % (ref 0.0–14.0)
NEUT%: 50 % (ref 38.4–76.8)
Platelets: 279 10*3/uL (ref 145–400)
RDW: 16.2 % — ABNORMAL HIGH (ref 11.2–14.5)
WBC: 2.2 10*3/uL — ABNORMAL LOW (ref 3.9–10.3)
nRBC: 0 % (ref 0–0)

## 2013-08-22 MED ORDER — ONDANSETRON HCL 8 MG PO TABS: 8.0000 mg | ORAL_TABLET | Freq: Three times a day (TID) | ORAL | Status: DC | PRN

## 2013-08-22 MED ORDER — PROCHLORPERAZINE MALEATE 10 MG PO TABS: 10.0000 mg | ORAL_TABLET | Freq: Four times a day (QID) | ORAL | Status: DC | PRN

## 2013-08-22 MED ORDER — HEPARIN SOD (PORK) LOCK FLUSH 100 UNIT/ML IV SOLN
500.0000 [IU] | Freq: Once | INTRAVENOUS | Status: AC | PRN
  Administered 2013-08-22: 500 [IU]
  Filled 2013-08-22: qty 5

## 2013-08-22 MED ORDER — SODIUM CHLORIDE 0.9 % IV SOLN
800.0000 mg/m2 | Freq: Once | INTRAVENOUS | Status: AC
  Administered 2013-08-22: 1216 mg via INTRAVENOUS
  Filled 2013-08-22: qty 31.98

## 2013-08-22 MED ORDER — PROCHLORPERAZINE EDISYLATE 5 MG/ML IJ SOLN
INTRAMUSCULAR | Status: AC
  Filled 2013-08-22: qty 2

## 2013-08-22 MED ORDER — PROCHLORPERAZINE MALEATE 10 MG PO TABS: 10.0000 mg | ORAL_TABLET | Freq: Once | ORAL | Status: DC

## 2013-08-22 MED ORDER — SODIUM CHLORIDE 0.9 % IV SOLN
Freq: Once | INTRAVENOUS | Status: AC
  Administered 2013-08-22: 14:00:00 via INTRAVENOUS

## 2013-08-22 MED ORDER — PROCHLORPERAZINE EDISYLATE 5 MG/ML IJ SOLN
10.0000 mg | Freq: Once | INTRAMUSCULAR | Status: AC
  Administered 2013-08-22: 10 mg via INTRAVENOUS

## 2013-08-22 MED ORDER — SODIUM CHLORIDE 0.9 % IJ SOLN
10.0000 mL | INTRAMUSCULAR | Status: DC | PRN
  Administered 2013-08-22: 10 mL
  Filled 2013-08-22: qty 10

## 2013-08-22 NOTE — Progress Notes (Signed)
I CALLED IN COMPAZINE REFILL FOR PT.

## 2013-08-22 NOTE — Telephone Encounter (Signed)
PT HAD VOMITING WITH COMPAZINE SO I CALLED IN ZOFRAN PER DR MOHAMED.

## 2013-08-22 NOTE — Patient Instructions (Addendum)
[facility] Discharge Instructions for Patients Receiving Chemotherapy  Today you received the following chemotherapy agents:  Gemzar  To help prevent nausea and vomiting after your treatment, we encourage you to take your nausea medication as ordered per MD.Neutropenia (Neutropenia) La neurtopenia es una alteracin que se produce cuando cierto tipo de glbulos blancos llamados neutrfilos tienen un nivel ms bajo que lo normal. Estas clulas se producen en la mdula sea y combaten las infecciones. Protegen contra las bacterias y los virus. Cunto ms bajo sea el nivel de neutrfilos y cuanto ms tiempo se mantenga bajo, ms riesgos tendr de sufrir una infeccin grave.  CAUSAS La causa de la neutropenia puede ser difcil de Chief Strategy Officer. Sin embargo, generalmente se debe a tres problemas principales:   Disminucin en la produccin de los neutrfilos. Puede deberse a:  Ciertos Passenger transport manager quimioterapia.  Problemas genticos.  Cncer.  Tratamientos de radioterapia.  Dficit de vitaminas.  Algunos pesticidas.  Aumento de Midwife de los neutrfilos. Puede deberse a:  Infecciones muy destructivas.  Anemia hemoltica. En este trastorno el organismo destruye sus propias glbulos rojos.  Quimioterapia.  Los neutrfilos movilizados a zonas del organismo donde no pueden Industrial/product designer las infecciones. Puede deberse a:  Procedimientos de dilisis.  Trastornos en los que se agranda el bazo. Los neutrfilos se mantienen en el bazo y no estn disponibles para combatir las infecciones en el resto del organismo.  Infecciones muy destructivas. Los neutrfilos se mantienen en la zona de la infeccin y no estn disponibles para combatir las infecciones en el resto del organismo. SNTOMAS No hay sntomas especficos para la neutropenia. La falta de neutrfilos puede causar una infeccin, y Neomia Dear infeccin puede causar varios problemas.  DIAGNSTICO El diagnstico se  realiza mediante anlisis de [place]. Para ello se realiza un recuento completo de Risk manager. El nivel normal de neutrfilos en la sangre humana vara con la edad y la raza. Los bebs tienen nivel ms bajo que los nios y [place]. Los afroamericanos tienen niveles ms bajos que los caucsicos o asiticos. El nivel promedio de los adultos es de 1500 clulas/mm3 de [place]. La cantidad de neutrfilos (en clulas/mm3) se interpretan como sigue:  Ms de 1000 ofrece una proteccin contra cualquier infeccin.  Entre 500 y 1000 significa un mayor riesgo de infeccin.  Entre 200 y 500 es un riesgo mayor de infeccin severa.  Menos de 200 es un riesgo importante de infeccin. Esto puede requerir hospitalizacin y TEFL teacher con antibiticos. TRATAMIENTO El tratamiento depende de la causa subyacente, la gravedad y la presencia de infecciones o sntomas. Tambin depende de la salud del [place]. El mdico comentar el tratamiento con usted. Los casos leves suelen ser fciles de tratar y tienen un buen pronstico. Tambin pueden iniciarse medidas preventivas limitar el riesgo de infecciones. Las opciones de tratamiento son:  Tomar antibiticos.  Suspender los medicamentos que se sabe que provocan neutropenia.  Correccin de las deficiencias nutricionales consumiendo vegetales verdes para obtener cido flico, y suplementos de vitamina B.  Suspensin a la exposicin a pesticidas si su neutropenia se relaciona con la exposicin a estas sustancias.  Se debe administrar un factor de crecimiento de sangre llamado sargramostim, pegfilgrastim o filgrastim, si est recibiendo quimioterapia para el cncer. Estos medicamentos estimulan la produccin de glbulos blancos.  Extirpacin del bazo, si sufre el sndrome de [place] y tienen infecciones repetidas. INSTRUCCIONES PARA EL CUIDADO EN EL HOGAR   Sigue las indicaciones de su mdico acerca de cuando debe hacerse  los anlisis de [place].  Lave sus manos con  frecuencia. Asegrese de los que que estn en contacto con usted tambin se laven las manos.  Lave las frutas y verduras crudas antes de comerlas. Pueden transmitir bacterias y hongos.  Evite a las personas resfriadas o con enfermedades que se transmiten (contagiosas) (varicela, herpes zoster, influenza).  Evite las multitudes.  Evite las zonas de construccin. El polvo puede liberar hongos en el aire.  Tenga cuidado con los nios en guarderas o en la escuela.  Cuide el sistema respiratorio tosiendo y respirando profundamente.  [employer].  Proteja su piel de heridas y [place].  No trabaje en el jardn o con flores y plantas.  Cuide su boca antes y despus de las comidas cepillndose con un cepillo de [place]. Si usted tiene la mucositis, no use enjuague bucal. El enjuague bucal contiene alcohol y se pueden Personal assistant boca an ms.  Limpie el rea entre los genitales y el ano (rea perineal) despus de Geographical information systems officer y mover el intestino. Las mujeres deben limpiarse de adelante hacia atrs.  Use un lubricante soluble en agua durante las relaciones sexuales e higiencese bien despus. No tenga relaciones sexuales si sufre neutropenia grave. Consulte con el profesional para que lo gue.  Haga ejercicios [employer], segn lo que [place].  Evite a las Education officer, museum fueron vacunadas con una vacuna con virus vivos durante los ltimos [address]. Usted no debe recibir vacunas con virus vivos (poliomielitis, fiebre tifoidea).  No cuide directamente a sus mascotas. Evite los excrementos de los [place]. No limpie las cajas de arena y las [place] para pjaros.  No comparta utensilios personales.  No use tampones, enemas o supositorios rectales excepto que lo indique su mdico.  Use una rasuradora elctrica para depilarse.  Lvese las manos despus de [place], cartas y peridicos. SOLICITE ATENCIN MDICA DE INMEDIATO SI:   Tiene fiebre.  Siente escalofros y  comienza a Environmental manager.  Se siente nauseoso o vomita.  Tiene llagas en la boca.  Siente dolores y [place].  Observa enrojecimiento e hinchazn alrededor de heridas abiertas.  La piel est caliente al tacto.  Observa pus que proviene de la herida.  Los ganglios se hinchan.  Se siente dbil o fatigado.  Tiene rayas rojas en la piel. ASEGRESE DE QUE:   Comprende estas instrucciones.  Controlar su enfermedad.  Solicitar ayuda de inmediato si no mejora o si empeora. Document Released: 08/18/2005 Document Revised: 01/31/2012 [facility] Patient Information 2014 [place], [place].    If you develop nausea and vomiting that is not controlled by your nausea medication, call the clinic.   BELOW ARE SYMPTOMS THAT SHOULD BE REPORTED IMMEDIATELY:  *FEVER GREATER THAN 100.5 F  *CHILLS WITH OR WITHOUT FEVER  NAUSEA AND VOMITING THAT IS NOT CONTROLLED WITH YOUR NAUSEA MEDICATION  *UNUSUAL SHORTNESS OF BREATH  *UNUSUAL BRUISING OR BLEEDING  TENDERNESS IN MOUTH AND THROAT WITH OR WITHOUT PRESENCE OF ULCERS  *URINARY PROBLEMS  *BOWEL PROBLEMS  UNUSUAL RASH Items with * indicate a potential emergency and should be followed up as soon as possible.  Feel free to call the clinic you have any questions or concerns. The clinic phone number is [phone].

## 2013-08-22 NOTE — Progress Notes (Signed)
1400-OK to treat with WBC-2.2 and Neut-1.1 per Dr. Arbutus Ped.  Pt is with no complaints at this time.  1500-Pt to receive Neulasta injection tomorrow per Dr. Arbutus Ped.  Pt has wedding to attend this Saturday.  1530-Pt refuses to take Neulasta injection.  Neulasta and Neutropenic precautions explained to pt per V. Tobey RN via Julie/Interpreter.  Pt has no questions at this time.

## 2013-08-22 NOTE — Progress Notes (Deleted)
Per Dr. Arbutus Ped it is ok to treat pt today with chemotherapy and todays CBC

## 2013-08-23 ENCOUNTER — Ambulatory Visit: Payer: Self-pay

## 2013-08-29 ENCOUNTER — Other Ambulatory Visit (HOSPITAL_BASED_OUTPATIENT_CLINIC_OR_DEPARTMENT_OTHER): Payer: Managed Care, Other (non HMO) | Admitting: Lab

## 2013-08-29 DIAGNOSIS — C221 Intrahepatic bile duct carcinoma: Secondary | ICD-10-CM

## 2013-08-29 LAB — CBC WITH DIFFERENTIAL/PLATELET
Basophils Absolute: 0 10*3/uL (ref 0.0–0.1)
EOS%: 0 % (ref 0.0–7.0)
Eosinophils Absolute: 0 10*3/uL (ref 0.0–0.5)
HGB: 8.6 g/dL — ABNORMAL LOW (ref 11.6–15.9)
LYMPH%: 43 % (ref 14.0–49.7)
MCH: 30.4 pg (ref 25.1–34.0)
MCHC: 34.1 g/dL (ref 31.5–36.0)
MCV: 89 fL (ref 79.5–101.0)
MONO%: 6 % (ref 0.0–14.0)
NEUT#: 0.8 10*3/uL — ABNORMAL LOW (ref 1.5–6.5)
NEUT%: 51 % (ref 38.4–76.8)
Platelets: 84 10*3/uL — ABNORMAL LOW (ref 145–400)
RDW: 15.3 % — ABNORMAL HIGH (ref 11.2–14.5)
WBC: 1.5 10*3/uL — ABNORMAL LOW (ref 3.9–10.3)

## 2013-08-29 LAB — COMPREHENSIVE METABOLIC PANEL (CC13)
Alkaline Phosphatase: 112 U/L (ref 40–150)
Anion Gap: 8 mEq/L (ref 3–11)
BUN: 14.8 mg/dL (ref 7.0–26.0)
CO2: 28 mEq/L (ref 22–29)
Calcium: 9.9 mg/dL (ref 8.4–10.4)
Creatinine: 0.7 mg/dL (ref 0.6–1.1)
Glucose: 158 mg/dl — ABNORMAL HIGH (ref 70–140)
Total Bilirubin: 0.39 mg/dL (ref 0.20–1.20)
Total Protein: 7 g/dL (ref 6.4–8.3)

## 2013-09-05 ENCOUNTER — Telehealth: Payer: Self-pay | Admitting: *Deleted

## 2013-09-05 ENCOUNTER — Other Ambulatory Visit (HOSPITAL_BASED_OUTPATIENT_CLINIC_OR_DEPARTMENT_OTHER): Payer: Managed Care, Other (non HMO) | Admitting: Lab

## 2013-09-05 ENCOUNTER — Other Ambulatory Visit: Payer: Self-pay | Admitting: Medical Oncology

## 2013-09-05 ENCOUNTER — Encounter: Payer: Self-pay | Admitting: Internal Medicine

## 2013-09-05 ENCOUNTER — Ambulatory Visit (HOSPITAL_BASED_OUTPATIENT_CLINIC_OR_DEPARTMENT_OTHER): Payer: Managed Care, Other (non HMO) | Admitting: Internal Medicine

## 2013-09-05 ENCOUNTER — Other Ambulatory Visit: Payer: Self-pay | Admitting: Lab

## 2013-09-05 ENCOUNTER — Ambulatory Visit (HOSPITAL_BASED_OUTPATIENT_CLINIC_OR_DEPARTMENT_OTHER): Payer: Managed Care, Other (non HMO)

## 2013-09-05 ENCOUNTER — Telehealth: Payer: Self-pay | Admitting: Medical Oncology

## 2013-09-05 ENCOUNTER — Telehealth: Payer: Self-pay | Admitting: Internal Medicine

## 2013-09-05 VITALS — BP 150/94 | HR 102 | Temp 98.1°F | Resp 18 | Ht 61.0 in | Wt 112.6 lb

## 2013-09-05 DIAGNOSIS — C787 Secondary malignant neoplasm of liver and intrahepatic bile duct: Secondary | ICD-10-CM

## 2013-09-05 DIAGNOSIS — Z5111 Encounter for antineoplastic chemotherapy: Secondary | ICD-10-CM

## 2013-09-05 DIAGNOSIS — R5381 Other malaise: Secondary | ICD-10-CM

## 2013-09-05 DIAGNOSIS — C221 Intrahepatic bile duct carcinoma: Secondary | ICD-10-CM

## 2013-09-05 LAB — COMPREHENSIVE METABOLIC PANEL (CC13)
ALT: 7 U/L (ref 0–55)
AST: 18 U/L (ref 5–34)
Albumin: 3.7 g/dL (ref 3.5–5.0)
Anion Gap: 7 mEq/L (ref 3–11)
BUN: 11.5 mg/dL (ref 7.0–26.0)
CO2: 27 mEq/L (ref 22–29)
Calcium: 9.3 mg/dL (ref 8.4–10.4)
Chloride: 101 mEq/L (ref 98–109)
Potassium: 4.3 mEq/L (ref 3.5–5.1)

## 2013-09-05 LAB — CBC WITH DIFFERENTIAL/PLATELET
BASO%: 0.3 % (ref 0.0–2.0)
Eosinophils Absolute: 0 10*3/uL (ref 0.0–0.5)
HCT: 28.5 % — ABNORMAL LOW (ref 34.8–46.6)
HGB: 9.7 g/dL — ABNORMAL LOW (ref 11.6–15.9)
MCHC: 34 g/dL (ref 31.5–36.0)
MONO#: 0.5 10*3/uL (ref 0.1–0.9)
NEUT#: 2.2 10*3/uL (ref 1.5–6.5)
NEUT%: 62.1 % (ref 38.4–76.8)
Platelets: 233 10*3/uL (ref 145–400)
RDW: 17.9 % — ABNORMAL HIGH (ref 11.2–14.5)
WBC: 3.5 10*3/uL — ABNORMAL LOW (ref 3.9–10.3)
lymph#: 0.8 10*3/uL — ABNORMAL LOW (ref 0.9–3.3)

## 2013-09-05 MED ORDER — DEXAMETHASONE SODIUM PHOSPHATE 20 MG/5ML IJ SOLN
12.0000 mg | Freq: Once | INTRAMUSCULAR | Status: AC
  Administered 2013-09-05: 12 mg via INTRAVENOUS

## 2013-09-05 MED ORDER — SODIUM CHLORIDE 0.9 % IJ SOLN
10.0000 mL | INTRAMUSCULAR | Status: DC | PRN
  Administered 2013-09-05: 10 mL
  Filled 2013-09-05: qty 10

## 2013-09-05 MED ORDER — PALONOSETRON HCL INJECTION 0.25 MG/5ML
0.2500 mg | Freq: Once | INTRAVENOUS | Status: AC
  Administered 2013-09-05: 0.25 mg via INTRAVENOUS

## 2013-09-05 MED ORDER — SODIUM CHLORIDE 0.9 % IV SOLN
INTRAVENOUS | Status: DC
  Administered 2013-09-05: 11:00:00 via INTRAVENOUS

## 2013-09-05 MED ORDER — POTASSIUM CHLORIDE 2 MEQ/ML IV SOLN
Freq: Once | INTRAVENOUS | Status: AC
  Administered 2013-09-05: 11:00:00 via INTRAVENOUS
  Filled 2013-09-05: qty 10

## 2013-09-05 MED ORDER — HEPARIN SOD (PORK) LOCK FLUSH 100 UNIT/ML IV SOLN
500.0000 [IU] | Freq: Once | INTRAVENOUS | Status: AC | PRN
  Administered 2013-09-05: 500 [IU]
  Filled 2013-09-05: qty 5

## 2013-09-05 MED ORDER — SODIUM CHLORIDE 0.9 % IV SOLN
150.0000 mg | Freq: Once | INTRAVENOUS | Status: AC
  Administered 2013-09-05: 150 mg via INTRAVENOUS
  Filled 2013-09-05: qty 5

## 2013-09-05 MED ORDER — PALONOSETRON HCL INJECTION 0.25 MG/5ML
INTRAVENOUS | Status: AC
  Filled 2013-09-05: qty 5

## 2013-09-05 MED ORDER — ONDANSETRON HCL 8 MG PO TABS: 8.0000 mg | ORAL_TABLET | Freq: Three times a day (TID) | ORAL | Status: DC | PRN

## 2013-09-05 MED ORDER — DEXAMETHASONE SODIUM PHOSPHATE 20 MG/5ML IJ SOLN
INTRAMUSCULAR | Status: AC
  Filled 2013-09-05: qty 5

## 2013-09-05 MED ORDER — SODIUM CHLORIDE 0.9 % IV SOLN
800.0000 mg/m2 | Freq: Once | INTRAVENOUS | Status: AC
  Administered 2013-09-05: 1216 mg via INTRAVENOUS
  Filled 2013-09-05: qty 31.98

## 2013-09-05 MED ORDER — CISPLATIN CHEMO INJECTION 100MG/100ML
80.0000 mg/m2 | Freq: Once | INTRAVENOUS | Status: AC
  Administered 2013-09-05: 120 mg via INTRAVENOUS
  Filled 2013-09-05: qty 120

## 2013-09-05 NOTE — Telephone Encounter (Signed)
Zofran has to be prior auth so i called [facility]ony with information and pt notified.

## 2013-09-05 NOTE — Patient Instructions (Signed)
Follow up in 3 weeks with repeat CT scan of the chest, abdomen and pelvis

## 2013-09-05 NOTE — Telephone Encounter (Signed)
GV AND PRINTED APPT SCHED AND AVS FORPT FOR oct ADN NOV....MW ADD TX

## 2013-09-05 NOTE — Progress Notes (Signed)
[facility] [phone]   [phone]  OFFICE PROGRESS NOTE  Lora Paula, MD [address] 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   CURRENT THERAPY: Systemic chemotherapy with cisplatin 100 mg/M2 on day 1 and gemcitabine 800 mg/M2 on days 1 and 8 every 3 weeks. First cycle on 05/23/2013. Status post 5 cycles.  INTERVAL HISTORY: Tammy [name] 57 y.o. female returns to the clinic today for follow up visit accompanied by her husband and her Spanish interpreter. The patient is doing fine today except for fatigue. She had few episodes of nausea after day 1 of her chemotherapy with cisplatin and gemcitabine. She denied having any significant fever or chills. The patient denied having any significant weight loss or night sweats. She has no chest pain, shortness of breath, cough or hemoptysis. She tolerated the last cycle of her chemotherapy fairly well except for the few episodes of nausea.   MEDICAL HISTORY: Past Medical History  Diagnosis Date  . Arthritis   . Polyp of colon 11/14/2008    seen on colonoscopy, f/u colonoscopy recommended in 5 years.   . 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  MEDICATIONS:  Current Outpatient Prescriptions  Medication Sig Dispense Refill  . lidocaine-prilocaine (EMLA) cream Apply topically as needed. Apply to port 1 hr before chemo  30 g  0  . ondansetron (ZOFRAN) 8 MG tablet Take 1 tablet (8 mg total) by mouth every 8 (eight) hours as needed for nausea.  30 tablet  0  . prochlorperazine (COMPAZINE) 10 MG tablet Take 1 tablet (10 mg total) by mouth every 6 (six) hours as needed (nausa and vomiting).  30 tablet  0   No current facility-administered medications for this visit.    SURGICAL HISTORY:  Past Surgical History  Procedure Laterality Date  . Abdominal hysterectomy  01/24/2007   including cervix, benign    REVIEW OF SYSTEMS:  Constitutional: positive for fatigue Eyes: negative Ears, nose, mouth, throat, and face: negative Respiratory: negative Cardiovascular: negative Gastrointestinal: positive for nausea Genitourinary:negative Integument/breast: negative Hematologic/lymphatic: negative Musculoskeletal:negative Neurological: negative Behavioral/Psych: positive for depression Endocrine: negative Allergic/Immunologic: negative   PHYSICAL EXAMINATION: General appearance: alert, cooperative, fatigued and no distress Head: Normocephalic, without obvious abnormality, atraumatic Neck: no adenopathy, no JVD, supple, symmetrical, trachea midline and thyroid not enlarged, symmetric, no tenderness/mass/nodules Lymph nodes: Cervical, supraclavicular, and axillary nodes normal. Resp: clear to auscultation bilaterally Back: symmetric, no curvature. ROM normal. No CVA tenderness. Cardio: regular rate and rhythm, S1, S2 normal, no murmur, click, rub or gallop GI: soft, non-tender; bowel sounds normal; no masses,  no organomegaly Extremities: extremities normal, atraumatic, no cyanosis or edema Neurologic: Alert and oriented X 3, normal strength and tone. Normal symmetric reflexes. Normal coordination and gait  ECOG PERFORMANCE STATUS: 1 - Symptomatic but completely ambulatory  Blood pressure 150/94, pulse 102, temperature 98.1 F (36.7 C), temperature source Oral, resp. rate 18, height 5\' 1"  (1.549 m), weight 112 lb 9.6 oz (51.075 kg).  LABORATORY DATA: Lab Results  Component Value Date   WBC 3.5* 09/05/2013   HGB 9.7* 09/05/2013   HCT 28.5* 09/05/2013   MCV 94.4 09/05/2013   PLT 233 09/05/2013      Chemistry      Component Value Date/Time   NA 136 08/29/2013 0918   NA 135 02/20/2013 1640   K 4.6 08/29/2013 1478  K 3.9 02/20/2013 1640   CL 95* 05/10/2013 1249   CL 95* 02/20/2013 1640   CO2 28 08/29/2013 0918   CO2 31 02/20/2013 1640   BUN 14.8 08/29/2013 0918    BUN 12 02/20/2013 1640   CREATININE 0.7 08/29/2013 0918   CREATININE 0.39* 02/20/2013 1640   CREATININE 0.54 06/24/2010 1951      Component Value Date/Time   CALCIUM 9.9 08/29/2013 0918   CALCIUM 10.1 02/20/2013 1640   ALKPHOS 112 08/29/2013 0918   ALKPHOS 155* 02/20/2013 1640   AST 19 08/29/2013 0918   AST 29 02/20/2013 1640   ALT 11 08/29/2013 0918   ALT 17 02/20/2013 1640   BILITOT 0.39 08/29/2013 0918   BILITOT 0.5 02/20/2013 1640       RADIOGRAPHIC STUDIES: No results found.  ASSESSMENT AND PLAN: this is a very pleasant 57 years old Hispanic female  With metastatic cholangiocarcinoma currently on treatment with cisplatin and gemcitabine status post 5 cycles. The patient is tolerating her treatment fairly well except for mild nausea after day 1 of every cycle especially with the treatment with cisplatin. I recommended for the patient to take her nausea medicine regularly few days after  Day 1 of cisplatin. She was given prescription today for Zofran Because she was intolerant to Compazine and it makes her sleepy. For depression, I discussed with the patient treatment with Remeron but the patient declined to proceed with the treatment for fear of dependence on this medication, but she may consider it in the future if absolutely needed. She would come back for follow up visit in 3 weeks with repeat CT scan of the chest, abdomen and pelvis for restaging of her disease. The patient was advised to call immediately if she has any concerning symptoms in the interval.  The patient voices understanding of current disease status and treatment options and is in agreement with the current care plan.  All questions were answered. The patient knows to call the clinic with any problems, questions or concerns. We can certainly see the patient much sooner if necessary.  I spent 15 minutes counseling the patient face to face. The total time spent in the appointment was 25 minutes.

## 2013-09-05 NOTE — Patient Instructions (Signed)
[facility] Discharge Instructions for Patients Receiving Chemotherapy  Today you received the following chemotherapy agents Gemzar/Cisplatin To help prevent nausea and vomiting after your treatment, we encourage you to take your nausea medication as prescribed.  If you develop nausea and vomiting that is not controlled by your nausea medication, call the clinic.   BELOW ARE SYMPTOMS THAT SHOULD BE REPORTED IMMEDIATELY:  *FEVER GREATER THAN 100.5 F  *CHILLS WITH OR WITHOUT FEVER  NAUSEA AND VOMITING THAT IS NOT CONTROLLED WITH YOUR NAUSEA MEDICATION  *UNUSUAL SHORTNESS OF BREATH  *UNUSUAL BRUISING OR BLEEDING  TENDERNESS IN MOUTH AND THROAT WITH OR WITHOUT PRESENCE OF ULCERS  *URINARY PROBLEMS  *BOWEL PROBLEMS  UNUSUAL RASH Items with * indicate a potential emergency and should be followed up as soon as possible.  Feel free to call the clinic you have any questions or concerns. The clinic phone number is [phone].    

## 2013-09-05 NOTE — Telephone Encounter (Signed)
Per staff message and POF I have scheduled appts.  [name]  

## 2013-09-12 ENCOUNTER — Other Ambulatory Visit (HOSPITAL_BASED_OUTPATIENT_CLINIC_OR_DEPARTMENT_OTHER): Payer: Managed Care, Other (non HMO) | Admitting: Lab

## 2013-09-12 ENCOUNTER — Ambulatory Visit (HOSPITAL_BASED_OUTPATIENT_CLINIC_OR_DEPARTMENT_OTHER): Payer: Managed Care, Other (non HMO)

## 2013-09-12 DIAGNOSIS — C787 Secondary malignant neoplasm of liver and intrahepatic bile duct: Secondary | ICD-10-CM

## 2013-09-12 DIAGNOSIS — R110 Nausea: Secondary | ICD-10-CM

## 2013-09-12 DIAGNOSIS — C221 Intrahepatic bile duct carcinoma: Secondary | ICD-10-CM

## 2013-09-12 LAB — CBC WITH DIFFERENTIAL/PLATELET
BASO%: 1.9 % (ref 0.0–2.0)
HCT: 29.7 % — ABNORMAL LOW (ref 34.8–46.6)
LYMPH%: 45.6 % (ref 14.0–49.7)
MCHC: 35.4 g/dL (ref 31.5–36.0)
MONO#: 0.1 10*3/uL (ref 0.1–0.9)
MONO%: 8.8 % (ref 0.0–14.0)
NEUT#: 0.7 10*3/uL — ABNORMAL LOW (ref 1.5–6.5)
NEUT%: 43.1 % (ref 38.4–76.8)
Platelets: 264 10*3/uL (ref 145–400)
RBC: 3.26 10*6/uL — ABNORMAL LOW (ref 3.70–5.45)
RDW: 15.5 % — ABNORMAL HIGH (ref 11.2–14.5)
WBC: 1.6 10*3/uL — ABNORMAL LOW (ref 3.9–10.3)
lymph#: 0.7 10*3/uL — ABNORMAL LOW (ref 0.9–3.3)

## 2013-09-12 LAB — TECHNOLOGIST REVIEW

## 2013-09-12 NOTE — Progress Notes (Signed)
Pt in for chemo today. Counts low.  Per Dr.Mohamed chemo to be held today.  Pt to return for next appointment as scheduled.  Pt. Is afebrile, through interpreter pt given neutropenic precautions and instructed to call for fever, chills, and any other s/s of infection.  Pt states she is having some mild nausea.  Pt instructed to use zofran and compazine and alternate as needed but to call for nausea and vomiting that persists.  (all information give via interpreter)  Pt and husband verbalized understanding.

## 2013-09-19 ENCOUNTER — Encounter (HOSPITAL_COMMUNITY): Payer: Self-pay

## 2013-09-19 ENCOUNTER — Ambulatory Visit (HOSPITAL_COMMUNITY)
Admission: RE | Admit: 2013-09-19 | Discharge: 2013-09-19 | Disposition: A | Discharge status: Home / Self Care | Payer: Managed Care, Other (non HMO) | Source: Ambulatory Visit | Attending: Internal Medicine | Admitting: Internal Medicine

## 2013-09-19 ENCOUNTER — Other Ambulatory Visit (HOSPITAL_BASED_OUTPATIENT_CLINIC_OR_DEPARTMENT_OTHER): Payer: Managed Care, Other (non HMO) | Admitting: Lab

## 2013-09-19 DIAGNOSIS — Z90710 Acquired absence of both cervix and uterus: Secondary | ICD-10-CM | POA: Insufficient documentation

## 2013-09-19 DIAGNOSIS — C772 Secondary and unspecified malignant neoplasm of intra-abdominal lymph nodes: Secondary | ICD-10-CM | POA: Insufficient documentation

## 2013-09-19 DIAGNOSIS — C221 Intrahepatic bile duct carcinoma: Secondary | ICD-10-CM

## 2013-09-19 DIAGNOSIS — R188 Other ascites: Secondary | ICD-10-CM | POA: Insufficient documentation

## 2013-09-19 DIAGNOSIS — M899 Disorder of bone, unspecified: Secondary | ICD-10-CM | POA: Insufficient documentation

## 2013-09-19 LAB — COMPREHENSIVE METABOLIC PANEL (CC13)
ALT: 13 U/L (ref 0–55)
Alkaline Phosphatase: 122 U/L (ref 40–150)
Anion Gap: 10 mEq/L (ref 3–11)
CO2: 26 mEq/L (ref 22–29)
Creatinine: 0.7 mg/dL (ref 0.6–1.1)
Glucose: 106 mg/dl (ref 70–140)
Sodium: 136 mEq/L (ref 136–145)
Total Bilirubin: 0.39 mg/dL (ref 0.20–1.20)
Total Protein: 7.4 g/dL (ref 6.4–8.3)

## 2013-09-19 LAB — CBC WITH DIFFERENTIAL/PLATELET
BASO%: 0 % (ref 0.0–2.0)
EOS%: 0.2 % (ref 0.0–7.0)
HGB: 10.3 g/dL — ABNORMAL LOW (ref 11.6–15.9)
MCH: 32.3 pg (ref 25.1–34.0)
MCHC: 34.1 g/dL (ref 31.5–36.0)
MONO#: 0.7 10*3/uL (ref 0.1–0.9)
NEUT#: 3.5 10*3/uL (ref 1.5–6.5)
Platelets: 128 10*3/uL — ABNORMAL LOW (ref 145–400)
RDW: 16.2 % — ABNORMAL HIGH (ref 11.2–14.5)
WBC: 5.1 10*3/uL (ref 3.9–10.3)
lymph#: 0.8 10*3/uL — ABNORMAL LOW (ref 0.9–3.3)

## 2013-09-19 MED ORDER — IOHEXOL 300 MG/ML  SOLN
100.0000 mL | Freq: Once | INTRAMUSCULAR | Status: AC | PRN
  Administered 2013-09-19: 100 mL via INTRAVENOUS

## 2013-09-19 MED ORDER — IOHEXOL 300 MG/ML  SOLN: 80.0000 mL | Freq: Once | INTRAMUSCULAR | Status: DC | PRN

## 2013-09-21 ENCOUNTER — Encounter: Payer: Self-pay | Admitting: Family Medicine

## 2013-09-26 ENCOUNTER — Telehealth: Payer: Self-pay | Admitting: Internal Medicine

## 2013-09-26 ENCOUNTER — Ambulatory Visit (HOSPITAL_BASED_OUTPATIENT_CLINIC_OR_DEPARTMENT_OTHER): Payer: Managed Care, Other (non HMO)

## 2013-09-26 ENCOUNTER — Encounter: Payer: Self-pay | Admitting: Internal Medicine

## 2013-09-26 ENCOUNTER — Other Ambulatory Visit (HOSPITAL_BASED_OUTPATIENT_CLINIC_OR_DEPARTMENT_OTHER): Payer: Managed Care, Other (non HMO) | Admitting: Lab

## 2013-09-26 ENCOUNTER — Ambulatory Visit (HOSPITAL_BASED_OUTPATIENT_CLINIC_OR_DEPARTMENT_OTHER): Payer: Managed Care, Other (non HMO) | Admitting: Internal Medicine

## 2013-09-26 ENCOUNTER — Telehealth: Payer: Self-pay | Admitting: *Deleted

## 2013-09-26 VITALS — BP 147/83 | HR 98 | Temp 98.2°F | Resp 18 | Ht 61.0 in | Wt 112.4 lb

## 2013-09-26 DIAGNOSIS — R110 Nausea: Secondary | ICD-10-CM

## 2013-09-26 DIAGNOSIS — C221 Intrahepatic bile duct carcinoma: Secondary | ICD-10-CM

## 2013-09-26 DIAGNOSIS — F341 Dysthymic disorder: Secondary | ICD-10-CM

## 2013-09-26 DIAGNOSIS — R5381 Other malaise: Secondary | ICD-10-CM

## 2013-09-26 DIAGNOSIS — Z5111 Encounter for antineoplastic chemotherapy: Secondary | ICD-10-CM

## 2013-09-26 DIAGNOSIS — Z23 Encounter for immunization: Secondary | ICD-10-CM

## 2013-09-26 LAB — COMPREHENSIVE METABOLIC PANEL (CC13)
ALT: 10 U/L (ref 0–55)
Albumin: 3.9 g/dL (ref 3.5–5.0)
Anion Gap: 10 mEq/L (ref 3–11)
BUN: 18.4 mg/dL (ref 7.0–26.0)
CO2: 23 mEq/L (ref 22–29)
Calcium: 9.6 mg/dL (ref 8.4–10.4)
Chloride: 102 mEq/L (ref 98–109)
Creatinine: 0.6 mg/dL (ref 0.6–1.1)
Potassium: 4.5 mEq/L (ref 3.5–5.1)
Sodium: 135 mEq/L — ABNORMAL LOW (ref 136–145)
Total Protein: 6.9 g/dL (ref 6.4–8.3)

## 2013-09-26 LAB — CBC WITH DIFFERENTIAL/PLATELET
BASO%: 0 % (ref 0.0–2.0)
HCT: 32.8 % — ABNORMAL LOW (ref 34.8–46.6)
MCH: 32.8 pg (ref 25.1–34.0)
MCHC: 33.8 g/dL (ref 31.5–36.0)
MCV: 97 fL (ref 79.5–101.0)
MONO#: 0.5 10*3/uL (ref 0.1–0.9)
MONO%: 12.3 % (ref 0.0–14.0)
NEUT%: 68.6 % (ref 38.4–76.8)
RDW: 15.8 % — ABNORMAL HIGH (ref 11.2–14.5)
WBC: 4.3 10*3/uL (ref 3.9–10.3)
lymph#: 0.8 10*3/uL — ABNORMAL LOW (ref 0.9–3.3)

## 2013-09-26 MED ORDER — SODIUM CHLORIDE 0.9 % IJ SOLN
10.0000 mL | INTRAMUSCULAR | Status: DC | PRN
  Administered 2013-09-26: 10 mL
  Filled 2013-09-26: qty 10

## 2013-09-26 MED ORDER — PALONOSETRON HCL INJECTION 0.25 MG/5ML
0.2500 mg | Freq: Once | INTRAVENOUS | Status: AC
  Administered 2013-09-26: 0.25 mg via INTRAVENOUS

## 2013-09-26 MED ORDER — INFLUENZA VAC SPLIT QUAD 0.5 ML IM SUSP
0.5000 mL | Freq: Once | INTRAMUSCULAR | Status: AC
  Administered 2013-09-26: 0.5 mL via INTRAMUSCULAR
  Filled 2013-09-26: qty 0.5

## 2013-09-26 MED ORDER — LORAZEPAM 0.5 MG PO TABS: 0.5000 mg | ORAL_TABLET | Freq: Three times a day (TID) | ORAL | Status: DC | PRN

## 2013-09-26 MED ORDER — DEXAMETHASONE SODIUM PHOSPHATE 20 MG/5ML IJ SOLN
INTRAMUSCULAR | Status: AC
  Filled 2013-09-26: qty 5

## 2013-09-26 MED ORDER — SODIUM CHLORIDE 0.9 % IV SOLN
INTRAVENOUS | Status: DC
  Administered 2013-09-26: 11:00:00 via INTRAVENOUS

## 2013-09-26 MED ORDER — DEXAMETHASONE SODIUM PHOSPHATE 20 MG/5ML IJ SOLN
12.0000 mg | Freq: Once | INTRAMUSCULAR | Status: AC
  Administered 2013-09-26: 12 mg via INTRAVENOUS

## 2013-09-26 MED ORDER — PALONOSETRON HCL INJECTION 0.25 MG/5ML
INTRAVENOUS | Status: AC
  Filled 2013-09-26: qty 5

## 2013-09-26 MED ORDER — SODIUM CHLORIDE 0.9 % IV SOLN
75.0000 mg/m2 | Freq: Once | INTRAVENOUS | Status: AC
  Administered 2013-09-26: 113 mg via INTRAVENOUS
  Filled 2013-09-26: qty 113

## 2013-09-26 MED ORDER — POTASSIUM CHLORIDE 2 MEQ/ML IV SOLN
Freq: Once | INTRAVENOUS | Status: AC
  Administered 2013-09-26: 11:00:00 via INTRAVENOUS
  Filled 2013-09-26: qty 10

## 2013-09-26 MED ORDER — HEPARIN SOD (PORK) LOCK FLUSH 100 UNIT/ML IV SOLN
500.0000 [IU] | Freq: Once | INTRAVENOUS | Status: AC | PRN
  Administered 2013-09-26: 500 [IU]
  Filled 2013-09-26: qty 5

## 2013-09-26 MED ORDER — SODIUM CHLORIDE 0.9 % IV SOLN
800.0000 mg/m2 | Freq: Once | INTRAVENOUS | Status: AC
  Administered 2013-09-26: 1216 mg via INTRAVENOUS
  Filled 2013-09-26: qty 32

## 2013-09-26 MED ORDER — SODIUM CHLORIDE 0.9 % IV SOLN
150.0000 mg | Freq: Once | INTRAVENOUS | Status: AC
  Administered 2013-09-26: 150 mg via INTRAVENOUS
  Filled 2013-09-26: qty 5

## 2013-09-26 NOTE — Telephone Encounter (Signed)
Per 11/4 POF emailed MW to add tx scheduled lab and ov 11/26 pt aware shh

## 2013-09-26 NOTE — Telephone Encounter (Signed)
worked 11/4 POF printed cal and avs and took to pt in tx today shh

## 2013-09-26 NOTE — Telephone Encounter (Signed)
Per staff message and POF I have scheduled appts.  [name]  

## 2013-09-26 NOTE — Patient Instructions (Signed)
Your scan showed continuous improvement in your disease. Continue treatment with cisplatin and gemcitabine. Followup visit in 3 weeks

## 2013-09-26 NOTE — Progress Notes (Signed)
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   CURRENT THERAPY: Systemic chemotherapy with cisplatin 100 mg/M2 on day 1 and gemcitabine 800 mg/M2 on days 1 and 8 every 3 weeks. First cycle on 05/23/2013. Status post 6 cycles.  INTERVAL HISTORY: Tammy [name] 57 y.o. female returns to the clinic today for follow up visit accompanied by her husband and her Spanish interpreter. The patient is doing fine today except for fatigue and few episodes of nausea after day 8 of her chemotherapy. Time doesn't work for her and the patient took Compazine with some improvement.   She denied having any significant fever or chills. The patient denied having any significant weight loss or night sweats. She has no chest pain, shortness of breath, cough or hemoptysis. She tolerated the last cycle of her chemotherapy fairly well except for the few episodes of nausea. She has repeat CT scan of the chest, abdomen and pelvis performed recently and she is here for evaluation and discussion of her scan results.  MEDICAL HISTORY: Past Medical History  Diagnosis Date  . Arthritis   . Polyp of colon 11/14/2008    seen on colonoscopy, f/u colonoscopy recommended in 5 years.   . 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  MEDICATIONS:  Current Outpatient Prescriptions  Medication Sig Dispense Refill  . lidocaine-prilocaine (EMLA) cream Apply topically as needed. Apply to port 1 hr before chemo  30 g  0  . ondansetron (ZOFRAN) 8 MG tablet Take 1 tablet (8 mg total) by mouth every 8 (eight) hours as needed for nausea.  30 tablet  0  . prochlorperazine (COMPAZINE) 10 MG tablet Take 1 tablet (10 mg total) by mouth every 6 (six) hours as needed (nausa and vomiting).  30 tablet  0     No current facility-administered medications for this visit.    SURGICAL HISTORY:  Past Surgical History  Procedure Laterality Date  . Abdominal hysterectomy  01/24/2007    including cervix, benign    REVIEW OF SYSTEMS:  Constitutional: positive for fatigue Eyes: negative Ears, nose, mouth, throat, and face: negative Respiratory: negative Cardiovascular: negative Gastrointestinal: positive for nausea Genitourinary:negative Integument/breast: negative Hematologic/lymphatic: negative Musculoskeletal:negative Neurological: negative Behavioral/Psych: positive for depression Endocrine: negative Allergic/Immunologic: negative   PHYSICAL EXAMINATION: General appearance: alert, cooperative, fatigued and no distress Head: Normocephalic, without obvious abnormality, atraumatic Neck: no adenopathy, no JVD, supple, symmetrical, trachea midline and thyroid not enlarged, symmetric, no tenderness/mass/nodules Lymph nodes: Cervical, supraclavicular, and axillary nodes normal. Resp: clear to auscultation bilaterally Back: symmetric, no curvature. ROM normal. No CVA tenderness. Cardio: regular rate and rhythm, S1, S2 normal, no murmur, click, rub or gallop GI: soft, non-tender; bowel sounds normal; no masses,  no organomegaly Extremities: extremities normal, atraumatic, no cyanosis or edema Neurologic: Alert and oriented X 3, normal strength and tone. Normal symmetric reflexes. Normal coordination and gait  ECOG PERFORMANCE STATUS: 1 - Symptomatic but completely ambulatory  Blood pressure 147/83, pulse 98, temperature 98.2 F (36.8 C), temperature source Oral, resp. rate 18, height 5\' 1"  (1.549 m), weight 112 lb 6.4 oz (50.984 kg).  LABORATORY DATA: Lab Results  Component Value Date   WBC 4.3 09/26/2013   HGB 11.1* 09/26/2013   HCT 32.8* 09/26/2013   MCV 97.0 09/26/2013  PLT 238 09/26/2013      Chemistry      Component Value Date/Time   NA 136 09/19/2013 0951   NA 135 02/20/2013 1640    K 4.5 09/19/2013 0951   K 3.9 02/20/2013 1640   CL 95* 05/10/2013 1249   CL 95* 02/20/2013 1640   CO2 26 09/19/2013 0951   CO2 31 02/20/2013 1640   BUN 16.7 09/19/2013 0951   BUN 12 02/20/2013 1640   CREATININE 0.7 09/19/2013 0951   CREATININE 0.39* 02/20/2013 1640   CREATININE 0.54 06/24/2010 1951      Component Value Date/Time   CALCIUM 10.2 09/19/2013 0951   CALCIUM 10.1 02/20/2013 1640   ALKPHOS 122 09/19/2013 0951   ALKPHOS 155* 02/20/2013 1640   AST 22 09/19/2013 0951   AST 29 02/20/2013 1640   ALT 13 09/19/2013 0951   ALT 17 02/20/2013 1640   BILITOT 0.39 09/19/2013 0951   BILITOT 0.5 02/20/2013 1640       RADIOGRAPHIC STUDIES: Ct Chest W Contrast  09/19/2013   CLINICAL DATA:  Cholangiocarcinoma  EXAM: CT CHEST WITH CONTRAST  CT ABDOMEN AND PELVIS WITH AND WITHOUT CONTRAST  TECHNIQUE: Multidetector CT imaging of the chest was performed during intravenous contrast administration. Multidetector CT imaging of the abdomen and pelvis was performed following the standard protocol before and during bolus administration of intravenous contrast.  CONTRAST:  OMNIPAQUE IOHEXOL 300 MG/ML  SOLN  COMPARISON:  07/20/2013  FINDINGS:   CT CHEST FINDINGS  Lungs are clear. No suspicious pulmonary nodules. No pleural effusion or pneumothorax.  Visualized thyroid is unremarkable.  Heart is normal in size.  No pericardial effusion.  No suspicious mediastinal, hilar, or axillary lymphadenopathy.  Right chest port.  Mild degenerative changes of the thoracic spine. Tiny sclerotic lesion in the T7 vertebral body (series 605/image 47), unchanged.    CT ABDOMEN AND PELVIS FINDINGS  Mild improvement in hepatic metastases, as follows:  --Aggregate 6.0 x 8.8 cm mass involving the central liver (series 4/ image 88), previously 6.6 x 9.5 cm  --10 mm lesion in the posterior segment right hepatic dome (series 4/ image 78), previously 13 mm  --4 mm lesion in the lateral segment left hepatic lobe (series 4/ image 83),  previously 6 mm  Spleen, pancreas, and adrenal glands are within normal limits.  Gallbladder is underdistended. Mild periportal edema. No intrahepatic or extrahepatic ductal dilatation.  Kidneys are within normal limits. No hydronephrosis.  No evidence of bowel obstruction. Normal appendix.  No evidence of abdominal aortic aneurysm.  Small volume pelvic ascites. No gross peritoneal nodularity.  Numerous small upper abdominal/retroperitoneal lymph nodes measuring up to 10 mm short axis in the left para-aortic region (series 4/ image 108), previously 11 mm.  Status post hysterectomy. No adnexal masses.  Bladder is underdistended.  Visualized osseous structures are within normal limits.    IMPRESSION: No evidence of metastatic disease in the chest.  Multifocal hepatic metastases, mildly improved.  Upper abdominal/retroperitoneal nodal metastases, mildly improved.  Small volume pelvic ascites. No gross peritoneal disease.   Electronically Signed   By: Charline Bills M.D.   On: 09/19/2013 13:06   ASSESSMENT AND PLAN: This is a very pleasant 57 years old Hispanic female with metastatic cholangiocarcinoma currently on treatment with cisplatin and gemcitabine status post 6 cycles.  Her recent CT scan of the chest, abdomen and pelvis showed no significant evidence for disease progression. I discussed the scan results with the patient and her family. I recommended for  her to continue with the same treatment regimen be The patient is tolerating her treatment fairly well except for mild nausea. Zofran did not help much for her nausea and the patient returned back to Compazine. I will start her on Ativan 0.5 mg by mouth every 8 hours as needed for anxiety or nausea. The patient also has some pain in her either and I will reduce the dose of cisplatin further to 75 mg/M2 every 3 weeks. The patient will proceed with cycle #7 of her chemotherapy today. She would come back for followup visit in 3 weeks with the next  cycle of her treatment. She was advised to call immediately if she has any concerning symptoms in the interval. The patient voices understanding of current disease status and treatment options and is in agreement with the current care plan.  All questions were answered. The patient knows to call the clinic with any problems, questions or concerns. We can certainly see the patient much sooner if necessary.  I spent 15 minutes counseling the patient face to face. The total time spent in the appointment was 25 minutes.

## 2013-09-26 NOTE — Patient Instructions (Signed)
[facility] Discharge Instructions for Patients Receiving Chemotherapy  Today you received the following chemotherapy agents Gemzar/Cisplatin To help prevent nausea and vomiting after your treatment, we encourage you to take your nausea medication as prescribed.  If you develop nausea and vomiting that is not controlled by your nausea medication, call the clinic.   BELOW ARE SYMPTOMS THAT SHOULD BE REPORTED IMMEDIATELY:  *FEVER GREATER THAN 100.5 F  *CHILLS WITH OR WITHOUT FEVER  NAUSEA AND VOMITING THAT IS NOT CONTROLLED WITH YOUR NAUSEA MEDICATION  *UNUSUAL SHORTNESS OF BREATH  *UNUSUAL BRUISING OR BLEEDING  TENDERNESS IN MOUTH AND THROAT WITH OR WITHOUT PRESENCE OF ULCERS  *URINARY PROBLEMS  *BOWEL PROBLEMS  UNUSUAL RASH Items with * indicate a potential emergency and should be followed up as soon as possible.  Feel free to call the clinic you have any questions or concerns. The clinic phone number is [phone].    

## 2013-10-03 ENCOUNTER — Ambulatory Visit: Payer: Managed Care, Other (non HMO)

## 2013-10-03 ENCOUNTER — Other Ambulatory Visit (HOSPITAL_BASED_OUTPATIENT_CLINIC_OR_DEPARTMENT_OTHER): Payer: Managed Care, Other (non HMO)

## 2013-10-03 ENCOUNTER — Other Ambulatory Visit: Payer: Self-pay | Admitting: *Deleted

## 2013-10-03 ENCOUNTER — Other Ambulatory Visit: Payer: Self-pay | Admitting: Internal Medicine

## 2013-10-03 DIAGNOSIS — C221 Intrahepatic bile duct carcinoma: Secondary | ICD-10-CM

## 2013-10-03 LAB — CBC WITH DIFFERENTIAL/PLATELET
Basophils Absolute: 0 10*3/uL (ref 0.0–0.1)
Eosinophils Absolute: 0 10*3/uL (ref 0.0–0.5)
HGB: 10.6 g/dL — ABNORMAL LOW (ref 11.6–15.9)
LYMPH%: 36.5 % (ref 14.0–49.7)
MONO#: 0.1 10*3/uL (ref 0.1–0.9)
NEUT#: 1.1 10*3/uL — ABNORMAL LOW (ref 1.5–6.5)
Platelets: 135 10*3/uL — ABNORMAL LOW (ref 145–400)
RBC: 3.29 10*6/uL — ABNORMAL LOW (ref 3.70–5.45)
RDW: 14.1 % (ref 11.2–14.5)
WBC: 2 10*3/uL — ABNORMAL LOW (ref 3.9–10.3)
nRBC: 0 % (ref 0–0)

## 2013-10-03 MED ORDER — PROMETHAZINE HCL 25 MG PO TABS: 25.0000 mg | ORAL_TABLET | Freq: Three times a day (TID) | ORAL | Status: AC | PRN

## 2013-10-03 NOTE — Patient Instructions (Signed)
Neutropenia Neutropenia is a condition that occurs when the level of a certain type of white blood cell (neutrophil) in your body becomes lower than normal. Neutrophils are made in the bone marrow and fight infections. These cells protect against bacteria and viruses. The fewer neutrophils you have, and the longer your body remains without them, the greater your risk of getting a severe infection becomes. CAUSES  The cause of neutropenia may be hard to determine. However, it is usually due to 3 main problems:   Decreased production of neutrophils. This may be due to:  Certain medicines such as chemotherapy.  Genetic problems.  Cancer.  Radiation treatments.  Vitamin deficiency.  Some pesticides.  Increased destruction of neutrophils. This may be due to:  Overwhelming infections.  Hemolytic anemia. This is when the body destroys its own blood cells.  Chemotherapy.  Neutrophils moving to areas of the body where they cannot fight infections. This may be due to:  Dialysis procedures.  Conditions where the spleen becomes enlarged. Neutrophils are held in the spleen and are not available to the rest of the body.  Overwhelming infections. The neutrophils are held in the area of the infection and are not available to the rest of the body. SYMPTOMS  There are no specific symptoms of neutropenia. The lack of neutrophils can result in an infection, and an infection can cause various problems. DIAGNOSIS  Diagnosis is made by a blood test. A complete blood count is performed. The normal level of neutrophils in human blood differs with age and race. Infants have lower counts than older children and adults. African Americans have lower counts than Caucasians or Asians. The average adult level is 1500 cells/mm3 of blood. Neutrophil counts are interpreted as follows:  Greater than 1000 cells/mm3 gives normal protection against infection.  500 to 1000 cells/mm3 gives an increased risk for  infection.  200 to 500 cells/mm3 is a greater risk for severe infection.  Lower than 200 cells/mm3 is a marked risk of infection. This may require hospitalization and treatment with antibiotic medicines. TREATMENT  Treatment depends on the underlying cause, severity, and presence of infections or symptoms. It also depends on your health. Your caregiver will discuss the treatment plan with you. Mild cases are often easily treated and have a good outcome. Preventative measures may also be started to limit your risk of infections. Treatment can include:  Taking antibiotics.  Stopping medicines that are known to cause neutropenia.  Correcting nutritional deficiencies by eating green vegetables to supply folic acid and taking vitamin B supplements.  Stopping exposure to pesticides if your neutropenia is related to pesticide exposure.  Taking a blood growth factor called sargramostim, pegfilgrastim, or filgrastim if you are undergoing chemotherapy for cancer. This stimulates white blood cell production.  Removal of the spleen if you have Felty's syndrome and have repeated infections. HOME CARE INSTRUCTIONS   Follow your caregiver's instructions about when you need to have blood work done.  Wash your hands often. Make sure others who come in contact with you also wash their hands.  Wash raw fruits and vegetables before eating them. They can carry bacteria and fungi.  Avoid people with colds or spreadable (contagious) diseases (chickenpox, herpes zoster, influenza).  Avoid large crowds.  Avoid construction areas. The dust can release fungus into the air.  Be cautious around children in daycare or school environments.  Take care of your respiratory system by coughing and deep breathing.  Bathe daily.  Protect your skin from cuts and   burns.  Do not work in the garden or with flowers and plants.  Care for the mouth before and after meals by brushing with a soft toothbrush. If you have  mucositis, do not use mouthwash. Mouthwash contains alcohol and can dry out the mouth even more.  Clean the area between the genitals and the anus (perineal area) after urination and bowel movements. Women need to wipe from front to back.  Use a water soluble lubricant during sexual intercourse and practice good hygiene after. Do not have intercourse if you are severely neutropenic. Check with your caregiver for guidelines.  Exercise daily as tolerated.  Avoid people who were vaccinated with a live vaccine in the past 30 days. You should not receive live vaccines (polio, typhoid).  Do not provide direct care for pets. Avoid animal droppings. Do not clean litter boxes and bird cages.  Do not share food utensils.  Do not use tampons, enemas, or rectal suppositories unless directed by your caregiver.  Use an electric razor to remove hair.  Wash your hands after handling magazines, letters, and newspapers. SEEK IMMEDIATE MEDICAL CARE IF:   You have a fever.  You have chills or start to shake.  You feel nauseous or vomit.  You develop mouth sores.  You develop aches and pains.  You have redness and swelling around open wounds.  Your skin is warm to the touch.  You have pus coming from your wounds.  You develop swollen lymph nodes.  You feel weak or fatigued.  You develop red streaks on the skin. MAKE SURE YOU:  Understand these instructions.  Will watch your condition.  Will get help right away if you are not doing well or get worse. Document Released: 04/30/2002 Document Revised: 01/31/2012 Document Reviewed: 05/28/2011 ExitCare Patient Information 2014 [employer].  

## 2013-10-03 NOTE — Progress Notes (Signed)
Hold treatment for ANC 1.1, give neulasta tomorrow per Dr. Arbutus Ped. Neutropenic precautions explained to patient. Masks provided.

## 2013-10-04 ENCOUNTER — Ambulatory Visit (HOSPITAL_BASED_OUTPATIENT_CLINIC_OR_DEPARTMENT_OTHER): Payer: Managed Care, Other (non HMO)

## 2013-10-04 VITALS — BP 133/89 | HR 90 | Temp 97.8°F

## 2013-10-04 DIAGNOSIS — Z5189 Encounter for other specified aftercare: Secondary | ICD-10-CM

## 2013-10-04 DIAGNOSIS — C221 Intrahepatic bile duct carcinoma: Secondary | ICD-10-CM

## 2013-10-04 MED ORDER — PEGFILGRASTIM INJECTION 6 MG/0.6ML
6.0000 mg | Freq: Once | SUBCUTANEOUS | Status: AC
  Administered 2013-10-04: 6 mg via SUBCUTANEOUS
  Filled 2013-10-04: qty 0.6

## 2013-10-04 NOTE — Patient Instructions (Signed)
Pegfilgrastim injection Qu es este medicamento? El PEGFILGRASTIM ayuda al cuerpo producir ms glbulos blancos. Se utiliza para prevenir infecciones en personas con cantidades bajas de glbulos blancos despus de un tratamiento del cncer. Este medicamento puede ser utilizado para otros usos; si tiene alguna pregunta consulte con su proveedor de atencin mdica o con su farmacutico. MARCAS COMERCIALES DISPONIBLES: Neulasta Qu le debo informar a mi profesional de la salud antes de tomar este medicamento? Necesita saber si usted presenta alguno de los siguientes problemas o situaciones: -anemia drepanoctica -una reaccin alrgica o inusual al pegfilgrastim, al filgrastim, a la protena E. coli, a otros medicamentos, alimentos, colorantes o conservantes -si est embarazada o buscando quedar embarazada -si est amamantando a un beb Cmo debo utilizar este medicamento? Este medicamento se administra mediante una inyeccin por va subcutnea. Generalmente lo administra un profesional de la salud en un hospital o en un entorno clnico. Si recibe este medicamento en su domicilio, le ensearn cmo preparar y administrar este medicamento. No agite este medicamento. selo exactamente como se le indique. Use su dosis a intervalos regulares. No use su medicamento con una frecuencia mayor a la indicada. Es importante que deseche las agujas y las jeringas usadas en un recipiente resistente a los pinchazos. No las deseche en una basura. Si no tiene un recipiente resistente a los pinchazos, consulte a su farmacutico o su proveedor de atencin para obtenerlo. Hable con su pediatra para informarse acerca del uso de este medicamento en nios. Aunque este medicamento se puede recetar a nios que pesan ms de 45 kg para condiciones selectivas, las precauciones se aplican. Sobredosis: Pngase en contacto inmediatamente con un centro toxicolgico o una sala de urgencia si usted cree que haya tomado demasiado  medicamento. ATENCIN: Este medicamento es solo para usted. No comparta este medicamento con nadie. Qu sucede si me olvido de una dosis? Si olvida una dosis, sela lo antes posible. Si es casi la hora de la prxima dosis, use slo esa dosis. No use dosis adicionales o dobles. Qu puede interactuar con este medicamento? -litio -medicamentos para la terapia de crecimiento Puede ser que esta lista no menciona todas las posibles interacciones. Informe a su profesional de la salud de todos los productos a base de hierbas, medicamentos de venta libre o suplementos nutritivos que est tomando. Si usted fuma, consume bebidas alcohlicas o si utiliza drogas ilegales, indqueselo tambin a su profesional de la salud. Algunas sustancias pueden interactuar con su medicamento. A qu debo estar atento al usar este medicamento? Visite a su mdico para chequeos peridicos. Necesitar realizarse anlisis de sangre importante mientras reciba este medicamento. Qu efectos secundarios puedo tener al utilizar este medicamento? Efectos secundarios que debe informar a su mdico o a su profesional de la salud tan pronto como sea posible: -reacciones alrgicas como erupcin cutnea, picazn o urticarias, hinchazn de la cara, labios o lengua -problemas respiratorios -fiebre -dolor, enrojecimiento o hinchazn en la zona de la inyeccin -dolor en el hombro -dolor de estmago o en un costado Efectos secundarios que, por lo general, no requieren atencin mdica (debe informarlos a su mdico o a su profesional de la salud si persisten o si son molestos): -molestias, dolores -dolor de cabeza -prdida del apetito -nuseas, vmito -cansancio inusual Puede ser que esta lista no menciona todos los posibles efectos secundarios. Comunquese a su mdico por asesoramiento mdico sobre los efectos secundarios. Usted puede informar los efectos secundarios a la FDA por telfono al 1-800-FDA-1088. Dnde debo guardar mi  medicina? Mantngala fuera del   alcance de los nios. Gurdela en un refrigerador, a una temperatura de entre 2 y 8 grados C (36 y 46 grados F). No la congele. Mantngala en la caja para protegerla de la luz. Si este medicamento queda fuera del refrigerador durante ms de 48 horas, debe desecharlo. Deseche todo el medicamento que no haya utilizado, despus de la fecha de vencimiento. ATENCIN: Este folleto es un resumen. Puede ser que no cubra toda la posible informacin. Si usted tiene preguntas acerca de esta medicina, consulte con su mdico, su farmacutico o su profesional de la salud.  2014, Elsevier/Gold Standard. (2007-04-14 11:30:00)  

## 2013-10-06 ENCOUNTER — Other Ambulatory Visit: Payer: Self-pay | Admitting: Internal Medicine

## 2013-10-17 ENCOUNTER — Ambulatory Visit (HOSPITAL_BASED_OUTPATIENT_CLINIC_OR_DEPARTMENT_OTHER): Payer: Managed Care, Other (non HMO)

## 2013-10-17 ENCOUNTER — Telehealth: Payer: Self-pay | Admitting: Internal Medicine

## 2013-10-17 ENCOUNTER — Other Ambulatory Visit (HOSPITAL_BASED_OUTPATIENT_CLINIC_OR_DEPARTMENT_OTHER): Payer: Managed Care, Other (non HMO)

## 2013-10-17 ENCOUNTER — Ambulatory Visit (HOSPITAL_BASED_OUTPATIENT_CLINIC_OR_DEPARTMENT_OTHER): Payer: Managed Care, Other (non HMO) | Admitting: Internal Medicine

## 2013-10-17 VITALS — BP 140/89 | HR 105 | Temp 96.7°F | Resp 18 | Ht 61.0 in | Wt 109.4 lb

## 2013-10-17 DIAGNOSIS — C221 Intrahepatic bile duct carcinoma: Secondary | ICD-10-CM

## 2013-10-17 DIAGNOSIS — Z5111 Encounter for antineoplastic chemotherapy: Secondary | ICD-10-CM

## 2013-10-17 DIAGNOSIS — G609 Hereditary and idiopathic neuropathy, unspecified: Secondary | ICD-10-CM

## 2013-10-17 LAB — COMPREHENSIVE METABOLIC PANEL (CC13)
Anion Gap: 12 mEq/L — ABNORMAL HIGH (ref 3–11)
BUN: 19.2 mg/dL (ref 7.0–26.0)
CO2: 23 mEq/L (ref 22–29)
Creatinine: 0.7 mg/dL (ref 0.6–1.1)
Glucose: 113 mg/dl (ref 70–140)
Total Bilirubin: 0.28 mg/dL (ref 0.20–1.20)

## 2013-10-17 LAB — CBC WITH DIFFERENTIAL/PLATELET
Basophils Absolute: 0 10*3/uL (ref 0.0–0.1)
HCT: 32.6 % — ABNORMAL LOW (ref 34.8–46.6)
HGB: 11.2 g/dL — ABNORMAL LOW (ref 11.6–15.9)
LYMPH%: 8.4 % — ABNORMAL LOW (ref 14.0–49.7)
MONO#: 0.8 10*3/uL (ref 0.1–0.9)
NEUT#: 10.2 10*3/uL — ABNORMAL HIGH (ref 1.5–6.5)
NEUT%: 84.8 % — ABNORMAL HIGH (ref 38.4–76.8)
Platelets: 230 10*3/uL (ref 145–400)
WBC: 12 10*3/uL — ABNORMAL HIGH (ref 3.9–10.3)
lymph#: 1 10*3/uL (ref 0.9–3.3)

## 2013-10-17 MED ORDER — PALONOSETRON HCL INJECTION 0.25 MG/5ML
0.2500 mg | Freq: Once | INTRAVENOUS | Status: AC
  Administered 2013-10-17: 0.25 mg via INTRAVENOUS

## 2013-10-17 MED ORDER — SODIUM CHLORIDE 0.9 % IJ SOLN
10.0000 mL | INTRAMUSCULAR | Status: DC | PRN
  Administered 2013-10-17: 10 mL
  Filled 2013-10-17: qty 10

## 2013-10-17 MED ORDER — SODIUM CHLORIDE 0.9 % IV SOLN
150.0000 mg | Freq: Once | INTRAVENOUS | Status: AC
  Administered 2013-10-17: 150 mg via INTRAVENOUS
  Filled 2013-10-17: qty 5

## 2013-10-17 MED ORDER — PALONOSETRON HCL INJECTION 0.25 MG/5ML
INTRAVENOUS | Status: AC
  Filled 2013-10-17: qty 5

## 2013-10-17 MED ORDER — DEXAMETHASONE SODIUM PHOSPHATE 20 MG/5ML IJ SOLN
12.0000 mg | Freq: Once | INTRAMUSCULAR | Status: AC
  Administered 2013-10-17: 12 mg via INTRAVENOUS

## 2013-10-17 MED ORDER — HEPARIN SOD (PORK) LOCK FLUSH 100 UNIT/ML IV SOLN
500.0000 [IU] | Freq: Once | INTRAVENOUS | Status: AC | PRN
  Administered 2013-10-17: 500 [IU]
  Filled 2013-10-17: qty 5

## 2013-10-17 MED ORDER — SODIUM CHLORIDE 0.9 % IV SOLN
INTRAVENOUS | Status: DC
  Administered 2013-10-17: 10:00:00 via INTRAVENOUS

## 2013-10-17 MED ORDER — SODIUM CHLORIDE 0.9 % IV SOLN
800.0000 mg/m2 | Freq: Once | INTRAVENOUS | Status: AC
  Administered 2013-10-17: 1216 mg via INTRAVENOUS
  Filled 2013-10-17: qty 31.98

## 2013-10-17 MED ORDER — POTASSIUM CHLORIDE 2 MEQ/ML IV SOLN
Freq: Once | INTRAVENOUS | Status: AC
  Administered 2013-10-17: 11:00:00 via INTRAVENOUS
  Filled 2013-10-17: qty 10

## 2013-10-17 MED ORDER — SODIUM CHLORIDE 0.9 % IV SOLN
60.0000 mg/m2 | Freq: Once | INTRAVENOUS | Status: AC
  Administered 2013-10-17: 90 mg via INTRAVENOUS
  Filled 2013-10-17: qty 90

## 2013-10-17 MED ORDER — DEXAMETHASONE SODIUM PHOSPHATE 20 MG/5ML IJ SOLN
INTRAMUSCULAR | Status: AC
  Filled 2013-10-17: qty 5

## 2013-10-17 NOTE — Progress Notes (Signed)
[facility] [phone]   [phone]  OFFICE PROGRESS NOTE  Lora Paula, MD [address] 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   CURRENT THERAPY: Systemic chemotherapy with cisplatin 100 mg/M2 on day 1 and gemcitabine 800 mg/M2 on days 1 and 8 every 3 weeks. First cycle on 05/23/2013. Status post 7 cycles. Cisplatin was reduced to 60 mg/M2 starting from cycle #8.  INTERVAL HISTORY: Tammy [name] 57 y.o. female returns to the clinic today for follow up visit accompanied by her husband who was the interpreter today in the absence of the Spanish interpreter for the Thanksgiving holiday. The patient is doing fine today except for fatigue and few episodes of nausea. She missed day 8 of her last cycle secondary to neutropenia. She denied having any significant fever or chills. The patient denied having any significant weight loss or night sweats. She has no chest pain, shortness of breath, cough or hemoptysis. She tolerated the last cycle of her chemotherapy fairly well except for the few episodes of nausea.   MEDICAL HISTORY: Past Medical History  Diagnosis Date  . Arthritis   . Polyp of colon 11/14/2008    seen on colonoscopy, f/u colonoscopy recommended in 5 years.   . 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  MEDICATIONS:  Current Outpatient Prescriptions  Medication Sig Dispense Refill  . lidocaine-prilocaine (EMLA) cream Apply topically as needed. Apply to port 1 hr before chemo  30 g  0  . LORazepam (ATIVAN) 0.5 MG tablet Take 1 tablet (0.5 mg total) by mouth every 8 (eight) hours as needed for anxiety.  30 tablet  0  . ondansetron (ZOFRAN) 8 MG tablet Take 1 tablet (8 mg total) by mouth every 8 (eight) hours as needed for nausea.  30 tablet  0  . prochlorperazine (COMPAZINE) 10 MG tablet Take 1 tablet (10 mg total) by  mouth every 6 (six) hours as needed (nausa and vomiting).  30 tablet  0  . prochlorperazine (COMPAZINE) 10 MG tablet TAKE 1 TABLET (10 MG TOTAL) BY MOUTH EVERY 6 (SIX) HOURS AS NEEDED (NAUSA AND VOMITING).  30 tablet  1  . promethazine (PHENERGAN) 25 MG tablet Take 1 tablet (25 mg total) by mouth every 8 (eight) hours as needed for nausea or vomiting.  30 tablet  0   No current facility-administered medications for this visit.    SURGICAL HISTORY:  Past Surgical History  Procedure Laterality Date  . Abdominal hysterectomy  01/24/2007    including cervix, benign    REVIEW OF SYSTEMS:  Constitutional: positive for fatigue Eyes: negative Ears, nose, mouth, throat, and face: negative Respiratory: negative Cardiovascular: negative Gastrointestinal: positive for nausea Genitourinary:negative Integument/breast: negative Hematologic/lymphatic: negative Musculoskeletal:negative Neurological: negative Behavioral/Psych: positive for depression Endocrine: negative Allergic/Immunologic: negative   PHYSICAL EXAMINATION: General appearance: alert, cooperative, fatigued and no distress Head: Normocephalic, without obvious abnormality, atraumatic Neck: no adenopathy, no JVD, supple, symmetrical, trachea midline and thyroid not enlarged, symmetric, no tenderness/mass/nodules Lymph nodes: Cervical, supraclavicular, and axillary nodes normal. Resp: clear to auscultation bilaterally Back: symmetric, no curvature. ROM normal. No CVA tenderness. Cardio: regular rate and rhythm, S1, S2 normal, no murmur, click, rub or gallop GI: soft, non-tender; bowel sounds normal; no masses,  no organomegaly Extremities: extremities normal, atraumatic, no cyanosis or edema Neurologic: Alert and oriented X 3, normal strength and tone. Normal  symmetric reflexes. Normal coordination and gait  ECOG PERFORMANCE STATUS: 1 - Symptomatic but completely ambulatory  Blood pressure 140/89, pulse 105, temperature 96.7 F  (35.9 C), temperature source Oral, resp. rate 18, height 5\' 1"  (1.549 m), weight 109 lb 6.4 oz (49.624 kg).  LABORATORY DATA: Lab Results  Component Value Date   WBC 12.0* 10/17/2013   HGB 11.2* 10/17/2013   HCT 32.6* 10/17/2013   MCV 95.6 10/17/2013   PLT 230 10/17/2013      Chemistry      Component Value Date/Time   NA 135* 09/26/2013 0924   NA 135 02/20/2013 1640   K 4.5 09/26/2013 0924   K 3.9 02/20/2013 1640   CL 95* 05/10/2013 1249   CL 95* 02/20/2013 1640   CO2 23 09/26/2013 0924   CO2 31 02/20/2013 1640   BUN 18.4 09/26/2013 0924   BUN 12 02/20/2013 1640   CREATININE 0.6 09/26/2013 0924   CREATININE 0.39* 02/20/2013 1640   CREATININE 0.54 06/24/2010 1951      Component Value Date/Time   CALCIUM 9.6 09/26/2013 0924   CALCIUM 10.1 02/20/2013 1640   ALKPHOS 115 09/26/2013 0924   ALKPHOS 155* 02/20/2013 1640   AST 20 09/26/2013 0924   AST 29 02/20/2013 1640   ALT 10 09/26/2013 0924   ALT 17 02/20/2013 1640   BILITOT 0.30 09/26/2013 0924   BILITOT 0.5 02/20/2013 1640       RADIOGRAPHIC STUDIES:  ASSESSMENT AND PLAN: This is a very pleasant 56 years old Hispanic female with metastatic cholangiocarcinoma currently on treatment with cisplatin and gemcitabine status post 7 cycles.  I recommended for her to continue with the same treatment regimen.  The patient is tolerating her treatment fairly well except for mild nausea. Her nausea is better controlled with Compazine and Ativan. 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 She would come back for followup visit in 3 weeks with the next cycle of her treatment. She was advised to call immediately if she has any concerning symptoms in the interval. The patient voices understanding of current disease status and treatment options and is in agreement with the current care plan.  All questions were answered. The patient knows to call the clinic with any problems, questions or  concerns. We can certainly see the patient much sooner if necessary.  I spent 15 minutes counseling the patient face to face. The total time spent in the appointment was 25 minutes.

## 2013-10-17 NOTE — Patient Instructions (Signed)
[facility] Discharge Instructions for Patients Receiving Chemotherapy  Today you received the following chemotherapy agents Gemzar and Cisplatin.  To help prevent nausea and vomiting after your treatment, we encourage you to take your nausea medication Phenergan 25mg  every 8 hours as needed for nausea.   If you develop nausea and vomiting that is not controlled by your nausea medication, call the clinic.   BELOW ARE SYMPTOMS THAT SHOULD BE REPORTED IMMEDIATELY:  *FEVER GREATER THAN 100.5 F  *CHILLS WITH OR WITHOUT FEVER  NAUSEA AND VOMITING THAT IS NOT CONTROLLED WITH YOUR NAUSEA MEDICATION  *UNUSUAL SHORTNESS OF BREATH  *UNUSUAL BRUISING OR BLEEDING  TENDERNESS IN MOUTH AND THROAT WITH OR WITHOUT PRESENCE OF ULCERS  *URINARY PROBLEMS  *BOWEL PROBLEMS  UNUSUAL RASH Items with * indicate a potential emergency and should be followed up as soon as possible.  Feel free to call the clinic you have any questions or concerns. The clinic phone number is [phone].

## 2013-10-17 NOTE — Telephone Encounter (Signed)
Gave pt appt for lab,md and chemo for December 2014 °

## 2013-10-19 ENCOUNTER — Encounter: Payer: Self-pay | Admitting: Internal Medicine

## 2013-10-19 NOTE — Patient Instructions (Signed)
Continue chemotherapy today as scheduled. Follow up visit in 3 weeks

## 2013-10-24 ENCOUNTER — Other Ambulatory Visit (HOSPITAL_BASED_OUTPATIENT_CLINIC_OR_DEPARTMENT_OTHER): Payer: Managed Care, Other (non HMO) | Admitting: Lab

## 2013-10-24 ENCOUNTER — Telehealth: Payer: Self-pay | Admitting: Internal Medicine

## 2013-10-24 ENCOUNTER — Telehealth: Payer: Self-pay | Admitting: Family Medicine

## 2013-10-24 ENCOUNTER — Other Ambulatory Visit: Payer: Self-pay | Admitting: Lab

## 2013-10-24 ENCOUNTER — Other Ambulatory Visit: Payer: Self-pay | Admitting: *Deleted

## 2013-10-24 ENCOUNTER — Ambulatory Visit: Payer: Self-pay

## 2013-10-24 ENCOUNTER — Ambulatory Visit (HOSPITAL_BASED_OUTPATIENT_CLINIC_OR_DEPARTMENT_OTHER): Payer: Managed Care, Other (non HMO) | Admitting: Physician Assistant

## 2013-10-24 ENCOUNTER — Ambulatory Visit (HOSPITAL_BASED_OUTPATIENT_CLINIC_OR_DEPARTMENT_OTHER): Payer: Managed Care, Other (non HMO)

## 2013-10-24 ENCOUNTER — Other Ambulatory Visit: Payer: Self-pay | Admitting: Medical Oncology

## 2013-10-24 VITALS — BP 120/71 | HR 101 | Temp 97.4°F | Resp 18 | Ht 61.0 in | Wt 108.3 lb

## 2013-10-24 DIAGNOSIS — C221 Intrahepatic bile duct carcinoma: Secondary | ICD-10-CM

## 2013-10-24 DIAGNOSIS — Z5111 Encounter for antineoplastic chemotherapy: Secondary | ICD-10-CM

## 2013-10-24 DIAGNOSIS — R110 Nausea: Secondary | ICD-10-CM

## 2013-10-24 LAB — CBC WITH DIFFERENTIAL/PLATELET
BASO%: 0.2 % (ref 0.0–2.0)
Basophils Absolute: 0 10*3/uL (ref 0.0–0.1)
EOS%: 0.2 % (ref 0.0–7.0)
HGB: 10.6 g/dL — ABNORMAL LOW (ref 11.6–15.9)
LYMPH%: 11.4 % — ABNORMAL LOW (ref 14.0–49.7)
MCHC: 35.1 g/dL (ref 31.5–36.0)
MCV: 93.2 fL (ref 79.5–101.0)
MONO%: 4.9 % (ref 0.0–14.0)
NEUT#: 5.5 10*3/uL (ref 1.5–6.5)
Platelets: 167 10*3/uL (ref 145–400)
RBC: 3.24 10*6/uL — ABNORMAL LOW (ref 3.70–5.45)
RDW: 13.1 % (ref 11.2–14.5)
WBC: 6.6 10*3/uL (ref 3.9–10.3)
lymph#: 0.8 10*3/uL — ABNORMAL LOW (ref 0.9–3.3)

## 2013-10-24 LAB — COMPREHENSIVE METABOLIC PANEL (CC13)
ALT: 42 U/L (ref 0–55)
AST: 33 U/L (ref 5–34)
Alkaline Phosphatase: 255 U/L — ABNORMAL HIGH (ref 40–150)
BUN: 28 mg/dL — ABNORMAL HIGH (ref 7.0–26.0)
CO2: 25 mEq/L (ref 22–29)
Sodium: 131 mEq/L — ABNORMAL LOW (ref 136–145)
Total Bilirubin: 0.38 mg/dL (ref 0.20–1.20)
Total Protein: 7.4 g/dL (ref 6.4–8.3)

## 2013-10-24 MED ORDER — OXYCODONE HCL 5 MG PO TABS: 5.0000 mg | ORAL_TABLET | Freq: Four times a day (QID) | ORAL | Status: DC | PRN

## 2013-10-24 MED ORDER — SODIUM CHLORIDE 0.9 % IV SOLN
Freq: Once | INTRAVENOUS | Status: AC
  Administered 2013-10-24: 11:00:00 via INTRAVENOUS

## 2013-10-24 MED ORDER — SODIUM CHLORIDE 0.9 % IV SOLN
800.0000 mg/m2 | Freq: Once | INTRAVENOUS | Status: AC
  Administered 2013-10-24: 1216 mg via INTRAVENOUS
  Filled 2013-10-24: qty 31.98

## 2013-10-24 MED ORDER — HEPARIN SOD (PORK) LOCK FLUSH 100 UNIT/ML IV SOLN
500.0000 [IU] | Freq: Once | INTRAVENOUS | Status: AC | PRN
  Administered 2013-10-24: 500 [IU]
  Filled 2013-10-24: qty 5

## 2013-10-24 MED ORDER — SODIUM CHLORIDE 0.9 % IJ SOLN
10.0000 mL | INTRAMUSCULAR | Status: DC | PRN
  Administered 2013-10-24: 10 mL
  Filled 2013-10-24: qty 10

## 2013-10-24 MED ORDER — PROCHLORPERAZINE MALEATE 10 MG PO TABS
ORAL_TABLET | ORAL | Status: AC
  Filled 2013-10-24: qty 1

## 2013-10-24 MED ORDER — PROCHLORPERAZINE MALEATE 10 MG PO TABS
10.0000 mg | ORAL_TABLET | Freq: Once | ORAL | Status: AC
  Administered 2013-10-24: 10 mg via ORAL

## 2013-10-24 NOTE — Patient Instructions (Signed)
[facility] Discharge Instructions for Patients Receiving Chemotherapy  Today you received the following chemotherapy agents Gemzar.  To help prevent nausea and vomiting after your treatment, we encourage you to take your nausea medication as prescribed.   If you develop nausea and vomiting that is not controlled by your nausea medication, call the clinic.   BELOW ARE SYMPTOMS THAT SHOULD BE REPORTED IMMEDIATELY:  *FEVER GREATER THAN 100.5 F  *CHILLS WITH OR WITHOUT FEVER  NAUSEA AND VOMITING THAT IS NOT CONTROLLED WITH YOUR NAUSEA MEDICATION  *UNUSUAL SHORTNESS OF BREATH  *UNUSUAL BRUISING OR BLEEDING  TENDERNESS IN MOUTH AND THROAT WITH OR WITHOUT PRESENCE OF ULCERS  *URINARY PROBLEMS  *BOWEL PROBLEMS  UNUSUAL RASH Items with * indicate a potential emergency and should be followed up as soon as possible.  Feel free to call the clinic you have any questions or concerns. The clinic phone number is [phone].    

## 2013-10-24 NOTE — Telephone Encounter (Signed)
Lauren, Would you please assess her current social situation. Thank you.  Arbutus Ped

## 2013-10-24 NOTE — Telephone Encounter (Signed)
appts made per 12/3 POF sw Nurse Diane Dr Judie Petit no avail on 17th put on 16th for OV per Nurse Diane AVS and CAL given to pt shh

## 2013-10-24 NOTE — Telephone Encounter (Signed)
Patient undergoing treatment for cholangiocarcinoma I have not seen her since 02/2013 when I was working up her abdominal pain.   Today I received a recent e-mail from a woman named Esau Grew stating that Mrs. Staffa is considering moving back to [place] due to lack of family support/often home alone. This is the email:  good morning My name is Esau Grew I'm authorized to get information about Faithanne's heath. I was talking to her and and she is expending a lot of time by her self, her husband works from 1 or 4 pm until 2 or 3 am she does not have family here her only daughter is in [place] and she is not comming any time soon. she wants to go back to [place]. I will like to talk to the doctor so he can explain her husband the gravity of her condition I think He does not understain the real situation. thanks   Marines please call patient to ask the following: 1. Is she still an [facility] patient? 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 Was this a new PCP visit?  2. What is her relationship with Esau Grew?   4. Has she spoken to SW/ her care team  at the cancer center regarding her concerns? She may qualify for home health. If not she should.   5. If she is still a [facility] patient she can always schedule f/u with me so I can speak with her and her support system, assess home health needs and provide support. We also have social work at the [facility].

## 2013-10-25 ENCOUNTER — Encounter: Payer: Self-pay | Admitting: Physician Assistant

## 2013-10-25 NOTE — Patient Instructions (Signed)
Follow-up in 2 weeks

## 2013-10-25 NOTE — Progress Notes (Addendum)
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   CURRENT THERAPY: Systemic chemotherapy with cisplatin 100 mg/M2 on day 1 and gemcitabine 800 mg/M2 on days 1 and 8 every 3 weeks. First cycle on 05/23/2013. Status post 7 cycles as well as day 1 of cycle 8. Cisplatin was reduced to 60 mg/M2 starting from cycle #8.  INTERVAL HISTORY: Tammy [name] 57 y.o. female returns to the clinic today for follow up visit accompanied by her husband  And a Spanish  interpreter. The patient is doing fine today except for fatigue and few episodes of nausea. She also complains of some tooth pain. She has a crown that needs a dressing and has a dental appointment on 11/01/2013. She reports the decreased appetite stating that food just doesn't taste very good. She reports feeling "uncomfortable" in the abdominal area almost feeling "tight". She denied diarrhea or constipation. She denied having any significant fever or chills. The patient denied having any significant weight loss or night sweats. She has no chest pain, shortness of breath, cough or hemoptysis. She tolerated the last cycle of her chemotherapy fairly well except for the few episodes of nausea.   MEDICAL HISTORY: Past Medical History  Diagnosis Date  . Arthritis   . Polyp of colon 11/14/2008    seen on colonoscopy, f/u colonoscopy recommended in 5 years.   . 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  MEDICATIONS:  Current Outpatient Prescriptions  Medication Sig Dispense Refill  . lidocaine-prilocaine (EMLA) cream Apply topically as needed. Apply to port 1 hr before chemo  30 g  0  . prochlorperazine (COMPAZINE) 10 MG tablet TAKE 1 TABLET (10 MG TOTAL) BY MOUTH EVERY 6 (SIX) HOURS AS NEEDED (NAUSA  AND VOMITING).  30 tablet  1  . LORazepam (ATIVAN) 0.5 MG tablet Take 1 tablet (0.5 mg total) by mouth every 8 (eight) hours as needed for anxiety.  30 tablet  0  . ondansetron (ZOFRAN) 8 MG tablet Take 1 tablet (8 mg total) by mouth every 8 (eight) hours as needed for nausea.  30 tablet  0  . oxyCODONE (OXY IR/ROXICODONE) 5 MG immediate release tablet Take 1 tablet (5 mg total) by mouth every 6 (six) hours as needed for severe pain.  30 tablet  0  . promethazine (PHENERGAN) 25 MG tablet Take 1 tablet (25 mg total) by mouth every 8 (eight) hours as needed for nausea or vomiting.  30 tablet  0   No current facility-administered medications for this visit.    SURGICAL HISTORY:  Past Surgical History  Procedure Laterality Date  . Abdominal hysterectomy  01/24/2007    including cervix, benign    REVIEW OF SYSTEMS:  Constitutional: positive for fatigue Eyes: negative Ears, nose, mouth, throat, and face: negative Respiratory: negative Cardiovascular: negative Gastrointestinal: positive for nausea Genitourinary:negative Integument/breast: negative Hematologic/lymphatic: negative Musculoskeletal:negative Neurological: negative Behavioral/Psych: positive for depression Endocrine: negative Allergic/Immunologic: negative   PHYSICAL EXAMINATION: General appearance: alert, cooperative, fatigued and no distress Head: Normocephalic, without obvious abnormality, atraumatic Neck: no adenopathy, no JVD, supple, symmetrical, trachea midline and thyroid not enlarged, symmetric, no tenderness/mass/nodules Lymph nodes: Cervical, supraclavicular, and axillary nodes normal. Resp: clear to auscultation bilaterally Back: symmetric, no curvature. ROM normal. No CVA tenderness. Cardio: regular  rate and rhythm, S1, S2 normal, no murmur, click, rub or gallop GI: soft, non-tender; bowel sounds normal; no masses,  no organomegaly Extremities: extremities normal, atraumatic, no cyanosis or edema Neurologic:  Alert and oriented X 3, normal strength and tone. Normal symmetric reflexes. Normal coordination and gait  ECOG PERFORMANCE STATUS: 1 - Symptomatic but completely ambulatory  Blood pressure 120/71, pulse 101, temperature 97.4 F (36.3 C), temperature source Oral, resp. rate 18, height 5\' 1"  (1.549 m), weight 108 lb 4.8 oz (49.125 kg), SpO2 100.00%.  LABORATORY DATA: Lab Results  Component Value Date   WBC 6.6 10/24/2013   HGB 10.6* 10/24/2013   HCT 30.2* 10/24/2013   MCV 93.2 10/24/2013   PLT 167 10/24/2013      Chemistry      Component Value Date/Time   NA 131* 10/24/2013 0945   NA 135 02/20/2013 1640   K 4.6 10/24/2013 0945   K 3.9 02/20/2013 1640   CL 95* 05/10/2013 1249   CL 95* 02/20/2013 1640   CO2 25 10/24/2013 0945   CO2 31 02/20/2013 1640   BUN 28.0* 10/24/2013 0945   BUN 12 02/20/2013 1640   CREATININE 0.7 10/24/2013 0945   CREATININE 0.39* 02/20/2013 1640   CREATININE 0.54 06/24/2010 1951      Component Value Date/Time   CALCIUM 9.7 10/24/2013 0945   CALCIUM 10.1 02/20/2013 1640   ALKPHOS 255* 10/24/2013 0945   ALKPHOS 155* 02/20/2013 1640   AST 33 10/24/2013 0945   AST 29 02/20/2013 1640   ALT 42 10/24/2013 0945   ALT 17 02/20/2013 1640   BILITOT 0.38 10/24/2013 0945   BILITOT 0.5 02/20/2013 1640       RADIOGRAPHIC STUDIES:  ASSESSMENT AND PLAN: This is a very pleasant 57 years old Hispanic female with metastatic cholangiocarcinoma currently on treatment with cisplatin and gemcitabine status post 7 cycles.  I recommended for her to continue with the same treatment regimen.  The patient is tolerating her treatment fairly well except for mild nausea. Her nausea is better controlled with Compazine and Ativan. She currently does not need a refill on either of these medications. Her dose of cisplatin was decreased to 60 mg per meter squared due to persistent peripheral neuropathy starting with cycle #8. She tolerated this reduced dose quite well. She'll follow with Dr. Arbutus Ped in 2 weeks prior to  the start of cycle #9.  Laural Benes, Tyreik Delahoussaye E, PA-C   She was advised to call immediately if she has any concerning symptoms in the interval. The patient voices understanding of current disease status and treatment options and is in agreement with the current care plan.  All questions were answered. The patient knows to call the clinic with any problems, questions or concerns. We can certainly see the patient much sooner if necessary.  ADDENDUM:  Hematology/Oncology Attending:  I had a face to face encounter with the patient. I recommended his current plan. This is a very pleasant 57 years old Hispanic female was metastatic cholangiocarcinoma currently undergoing systemic chemotherapy with cisplatin and gemcitabine and tolerating it fairly well except for nausea and peripheral neuropathy. I would further reduce her dose of cisplatin to 60 mg/M2 and gemcitabine 800 mg/M2. She would proceed with day 8 of cycle #8 today as scheduled. The patient would come back for followup visit in 2 weeks with the start of cycle #9. She was advised to call immediately if she has any concerning symptoms in the interval. Lajuana Matte., MD 10/27/2013

## 2013-10-26 ENCOUNTER — Encounter: Payer: Self-pay | Admitting: *Deleted

## 2013-10-26 NOTE — Progress Notes (Signed)
[facility] Clinical Social Work  Clinical Social Work was referred by Systems developer for assessment of psychosocial needs.  Clinical Social Worker attempted to contact patient or spouse Tammy [name] by phone to offer support and assess for needs.  CSW unable to reach patient/spouse, patient's spouse works during theday.  CSW is familiar with patient and family support system Tammy [name]).  CSW will attempt to reach family at a later time and will contact Tammy [name], patient's close family friend/advocate.   Kathrin Penner, MSW, LCSW Clinical Social Worker [facility] [phone]

## 2013-10-29 ENCOUNTER — Telehealth: Payer: Self-pay | Admitting: Medical Oncology

## 2013-10-30 NOTE — Telephone Encounter (Signed)
I have tried several numbers to reach pt via [employer] interpret[employer] to see how pt is feeling. I did not get anyone to answer a phone.

## 2013-10-30 NOTE — Telephone Encounter (Signed)
Dr. Armen Pickup I spoke with Pt and her husband. Pt is very optimist, they do not have any psychologic support at this time, they are very concern about their economic situation.  Husband states after I asked about Tammy [name] that she is a family friend but, Tammy [name]'s family do not want more people involved in their situation.  I tell them that they are very welcome to our clinic.   Marines

## 2013-10-31 ENCOUNTER — Other Ambulatory Visit (HOSPITAL_BASED_OUTPATIENT_CLINIC_OR_DEPARTMENT_OTHER): Payer: Managed Care, Other (non HMO)

## 2013-10-31 DIAGNOSIS — C221 Intrahepatic bile duct carcinoma: Secondary | ICD-10-CM

## 2013-10-31 LAB — COMPREHENSIVE METABOLIC PANEL (CC13)
ALT: 22 U/L (ref 0–55)
Albumin: 4 g/dL (ref 3.5–5.0)
Anion Gap: 10 mEq/L (ref 3–11)
CO2: 27 mEq/L (ref 22–29)
Calcium: 9.9 mg/dL (ref 8.4–10.4)
Chloride: 100 mEq/L (ref 98–109)
Creatinine: 0.8 mg/dL (ref 0.6–1.1)
Glucose: 155 mg/dl — ABNORMAL HIGH (ref 70–140)
Sodium: 136 mEq/L (ref 136–145)
Total Protein: 7.1 g/dL (ref 6.4–8.3)

## 2013-10-31 LAB — CBC WITH DIFFERENTIAL/PLATELET
BASO%: 0 % (ref 0.0–2.0)
Basophils Absolute: 0 10*3/uL (ref 0.0–0.1)
Eosinophils Absolute: 0 10*3/uL (ref 0.0–0.5)
HGB: 8.3 g/dL — ABNORMAL LOW (ref 11.6–15.9)
MONO#: 0.1 10*3/uL (ref 0.1–0.9)
NEUT#: 1.5 10*3/uL (ref 1.5–6.5)
Platelets: 39 10*3/uL — ABNORMAL LOW (ref 145–400)
RBC: 2.61 10*6/uL — ABNORMAL LOW (ref 3.70–5.45)
RDW: 12.7 % (ref 11.2–14.5)
WBC: 2.2 10*3/uL — ABNORMAL LOW (ref 3.9–10.3)
nRBC: 0 % (ref 0–0)

## 2013-11-01 ENCOUNTER — Encounter: Payer: Self-pay | Admitting: *Deleted

## 2013-11-01 ENCOUNTER — Telehealth: Payer: Self-pay | Admitting: Medical Oncology

## 2013-11-01 NOTE — Progress Notes (Signed)
[facility] Clinical Social Work  Clinical Social Work was referred by Systems developer for assessment of psychosocial needs.  Clinical Social Worker met with patient and spouse at [facility] to offer support patient/family.  The patient/spouse share they are doing well, however, their main concern is patient being home alone in evenings when spouse works. At this time, Mrs. Kilduff, sister is visiting from [place] and is able to provide care to patient while spouse is working.  The patient's sister plans to stay in the states for around a month.  CSW and patient's spouse briefly discussed obtaining temporary VISA for patient's family to help provide care- CSW explained this can be a very difficult process,however patient's spouse plans to follow up with CSW if assistance is needed.  CSW reviewed options for home care; family has limited resources and is unable to pay for services out of pocket (these services are not covered by insurance).    The patient has no plans of moving to [place], patient/spouse view her medical condition as top priority at this time and feel it is important to be treated here in [place].  The patient states, "my dream is to go back there, even for a few weeks". Patient's spouse expressed understanding that they would  consult with medical oncologist before planning any long distance trips.  CSW provided brief emotional support and encouraged patient/family to call with questions, concerns, or to just "check in".  Kathrin Penner, MSW, LCSW Clinical Social Worker [facility] [phone]

## 2013-11-01 NOTE — Telephone Encounter (Addendum)
Pt needs treatment for abcess . Dr Regino Schultze is putting her on amoxicillin . Is Dr Arbutus Ped okay with this? I told them she did not have any allergies to antibiotics per her record.

## 2013-11-06 ENCOUNTER — Encounter: Payer: Self-pay | Admitting: Physician Assistant

## 2013-11-06 ENCOUNTER — Telehealth: Payer: Self-pay | Admitting: Internal Medicine

## 2013-11-06 ENCOUNTER — Ambulatory Visit (HOSPITAL_COMMUNITY)
Admission: RE | Admit: 2013-11-06 | Discharge: 2013-11-06 | Disposition: A | Discharge status: Home / Self Care | Payer: Managed Care, Other (non HMO) | Source: Ambulatory Visit | Attending: Internal Medicine | Admitting: Internal Medicine

## 2013-11-06 ENCOUNTER — Ambulatory Visit (HOSPITAL_BASED_OUTPATIENT_CLINIC_OR_DEPARTMENT_OTHER): Payer: Managed Care, Other (non HMO) | Admitting: Physician Assistant

## 2013-11-06 ENCOUNTER — Ambulatory Visit: Payer: Managed Care, Other (non HMO)

## 2013-11-06 ENCOUNTER — Other Ambulatory Visit (HOSPITAL_BASED_OUTPATIENT_CLINIC_OR_DEPARTMENT_OTHER): Payer: Managed Care, Other (non HMO)

## 2013-11-06 VITALS — BP 148/84 | HR 101 | Temp 97.3°F | Resp 18 | Ht 61.0 in | Wt 112.8 lb

## 2013-11-06 DIAGNOSIS — D6481 Anemia due to antineoplastic chemotherapy: Secondary | ICD-10-CM | POA: Insufficient documentation

## 2013-11-06 DIAGNOSIS — C221 Intrahepatic bile duct carcinoma: Secondary | ICD-10-CM

## 2013-11-06 DIAGNOSIS — T451X5A Adverse effect of antineoplastic and immunosuppressive drugs, initial encounter: Secondary | ICD-10-CM | POA: Insufficient documentation

## 2013-11-06 LAB — COMPREHENSIVE METABOLIC PANEL (CC13)
ALT: 16 U/L (ref 0–55)
AST: 25 U/L (ref 5–34)
Albumin: 4 g/dL (ref 3.5–5.0)
Alkaline Phosphatase: 199 U/L — ABNORMAL HIGH (ref 40–150)
BUN: 21.4 mg/dL (ref 7.0–26.0)
Calcium: 9.4 mg/dL (ref 8.4–10.4)
Chloride: 96 mEq/L — ABNORMAL LOW (ref 98–109)
Glucose: 101 mg/dl (ref 70–140)
Potassium: 4.3 mEq/L (ref 3.5–5.1)
Sodium: 133 mEq/L — ABNORMAL LOW (ref 136–145)

## 2013-11-06 LAB — CBC WITH DIFFERENTIAL/PLATELET
BASO%: 0.4 % (ref 0.0–2.0)
Basophils Absolute: 0 10*3/uL (ref 0.0–0.1)
EOS%: 0.5 % (ref 0.0–7.0)
HCT: 23.9 % — ABNORMAL LOW (ref 34.8–46.6)
MCHC: 32.4 g/dL (ref 31.5–36.0)
MONO#: 0.5 10*3/uL (ref 0.1–0.9)
MONO%: 14.9 % — ABNORMAL HIGH (ref 0.0–14.0)
RBC: 2.43 10*6/uL — ABNORMAL LOW (ref 3.70–5.45)
RDW: 14.2 % (ref 11.2–14.5)
WBC: 3.2 10*3/uL — ABNORMAL LOW (ref 3.9–10.3)
lymph#: 0.6 10*3/uL — ABNORMAL LOW (ref 0.9–3.3)

## 2013-11-06 LAB — PREPARE RBC (CROSSMATCH)

## 2013-11-06 LAB — MAGNESIUM (CC13): Magnesium: 2 mg/dl (ref 1.5–2.5)

## 2013-11-06 NOTE — Telephone Encounter (Signed)
add to previous note. central will call pt w/ct appt.

## 2013-11-06 NOTE — Progress Notes (Addendum)
[facility] [phone]   Fax:([phone]  SHARED VISIT PROGRESS NOTE  Lora Paula, MD [address] 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   CURRENT THERAPY: Systemic chemotherapy with cisplatin 100 mg/M2 on day 1 and gemcitabine 800 mg/M2 on days 1 and 8 every 3 weeks. First cycle on 05/23/2013. Status post 7 cycles as well as day 1 of cycle 8. Cisplatin was reduced to 60 mg/M2 starting from cycle #8.Status post 8 cycles  INTERVAL HISTORY: CAROLYNN TULEY 57 y.o. female returns to the clinic today for follow up visit accompanied by her husband, sister and a Spanish  interpreter. Her sister is visiting from [place]. She is made favorite foods for the patient and as a result patient has gained weight. The patient is doing fine today except for fatigue and few episodes of nausea. She complains of some occasional right upper quadrant pain.  She denied diarrhea or constipation. She denied having any significant fever or chills. The patient denied having any significant weight loss or night sweats. She has no chest pain, shortness of breath, cough or hemoptysis. She tolerated the last cycle of her chemotherapy fairly well except for the few episodes of nausea.   MEDICAL HISTORY: Past Medical History  Diagnosis Date  . Arthritis   . Polyp of colon 11/14/2008    seen on colonoscopy, f/u colonoscopy recommended in 5 years.   . 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  MEDICATIONS:  Current Outpatient Prescriptions  Medication Sig Dispense Refill  . amoxicillin (AMOXIL) 500 MG capsule       . lidocaine-prilocaine (EMLA) cream Apply topically as needed. Apply to port 1 hr before chemo  30 g  0  . LORazepam (ATIVAN) 0.5 MG tablet Take 1 tablet (0.5 mg total) by mouth every 8 (eight) hours as needed for anxiety.  30 tablet  0  . ondansetron  (ZOFRAN) 8 MG tablet Take 1 tablet (8 mg total) by mouth every 8 (eight) hours as needed for nausea.  30 tablet  0  . oxyCODONE (OXY IR/ROXICODONE) 5 MG immediate release tablet Take 1 tablet (5 mg total) by mouth every 6 (six) hours as needed for severe pain.  30 tablet  0  . prochlorperazine (COMPAZINE) 10 MG tablet TAKE 1 TABLET (10 MG TOTAL) BY MOUTH EVERY 6 (SIX) HOURS AS NEEDED (NAUSA AND VOMITING).  30 tablet  1  . promethazine (PHENERGAN) 25 MG tablet Take 1 tablet (25 mg total) by mouth every 8 (eight) hours as needed for nausea or vomiting.  30 tablet  0   No current facility-administered medications for this visit.    SURGICAL HISTORY:  Past Surgical History  Procedure Laterality Date  . Abdominal hysterectomy  01/24/2007    including cervix, benign    REVIEW OF SYSTEMS:  Constitutional: positive for fatigue Eyes: negative Ears, nose, mouth, throat, and face: negative Respiratory: negative Cardiovascular: negative Gastrointestinal: positive for nausea Genitourinary:negative Integument/breast: negative Hematologic/lymphatic: negative Musculoskeletal:negative Neurological: negative Behavioral/Psych: positive for depression Endocrine: negative Allergic/Immunologic: negative   PHYSICAL EXAMINATION: General appearance: alert, cooperative, fatigued and no distress Head: Normocephalic, without obvious abnormality, atraumatic Neck: no adenopathy, no JVD, supple, symmetrical, trachea midline and thyroid not enlarged, symmetric, no tenderness/mass/nodules Lymph nodes: Cervical, supraclavicular, and axillary nodes normal. Resp: clear to auscultation bilaterally Back: symmetric, no curvature. ROM normal. No CVA tenderness. Cardio:  regular rate and rhythm, S1, S2 normal, no murmur, click, rub or gallop GI: soft, non-tender; bowel sounds normal; no masses,  no organomegaly Extremities: extremities normal, atraumatic, no cyanosis or edema Neurologic: Alert and oriented X 3, normal  strength and tone. Normal symmetric reflexes. Normal coordination and gait  ECOG PERFORMANCE STATUS: 1 - Symptomatic but completely ambulatory  Blood pressure 148/84, pulse 101, temperature 97.3 F (36.3 C), temperature source Oral, resp. rate 18, height 5\' 1"  (1.549 m), weight 112 lb 12.8 oz (51.166 kg).  LABORATORY DATA: Lab Results  Component Value Date   WBC 3.2* 11/06/2013   HGB 7.7* 11/06/2013   HCT 23.9* 11/06/2013   MCV 98.4 11/06/2013   PLT 210 11/06/2013      Chemistry      Component Value Date/Time   NA 133* 11/06/2013 1531   NA 135 02/20/2013 1640   K 4.3 11/06/2013 1531   K 3.9 02/20/2013 1640   CL 95* 05/10/2013 1249   CL 95* 02/20/2013 1640   CO2 29 11/06/2013 1531   CO2 31 02/20/2013 1640   BUN 21.4 11/06/2013 1531   BUN 12 02/20/2013 1640   CREATININE 0.8 11/06/2013 1531   CREATININE 0.39* 02/20/2013 1640   CREATININE 0.54 06/24/2010 1951      Component Value Date/Time   CALCIUM 9.4 11/06/2013 1531   CALCIUM 10.1 02/20/2013 1640   ALKPHOS 199* 11/06/2013 1531   ALKPHOS 155* 02/20/2013 1640   AST 25 11/06/2013 1531   AST 29 02/20/2013 1640   ALT 16 11/06/2013 1531   ALT 17 02/20/2013 1640   BILITOT 0.24 11/06/2013 1531   BILITOT 0.5 02/20/2013 1640       RADIOGRAPHIC STUDIES:  ASSESSMENT AND PLAN: This is a very pleasant 57 years old Hispanic female with metastatic cholangiocarcinoma currently on treatment with cisplatin and gemcitabine status post 8 cycles. Patient was discussed with him also seen by Dr. Arbutus Ped. She will proceed with cycle #9 of her systemic chemotherapy with cisplatin and gemcitabine. Her hemoglobin is low at 7.7 and we will arrange to transfuse her total of 2 units of packed red blood cells to address the chemotherapy-induced anemia. The patient is tolerating her treatment fairly well except for mild nausea. Her nausea is better controlled with Compazine and Ativan. She currently does not need a refill on either of these medications. Her dose of  cisplatin was decreased to 60 mg per meter squared due to persistent peripheral neuropathy starting with cycle #8. She tolerated this reduced dose quite well. She'll follow with Dr. Arbutus Ped in 3 weeks prior to the start of cycle #10.  Laural Benes, Talvin Christianson E, PA-C   She was advised to call immediately if she has any concerning symptoms in the interval. The patient voices understanding of current disease status and treatment options and is in agreement with the current care plan.  All questions were answered. The patient knows to call the clinic with any problems, questions or concerns. We can certainly see the patient much sooner if necessary.  ADDENDUM: Hematology/Oncology Attending:  I had a face to face encounter with the patient. I recommended for her care plan. This is a very pleasant 57 years old Hispanic female with metastatic cholangiocarcinoma currently undergoing systemic chemotherapy with reduced dose cisplatin and gemcitabine status post 8 cycles. The patient is feeling fine today except for fatigue secondary to chemotherapy-induced anemia.  For the chemotherapy-induced anemia, I will arrange for the patient to receive 2 units of PRBCs transfusion. We will proceed with  her chemotherapy today as scheduled. She would come back for follow up visit in 3 weeks with repeat CT scan of the chest, abdomen and pelvis for evaluation before starting cycle #10 For nausea she would continue on Zofran on as-needed basis. She was advised to call immediately if she has any concerning symptoms in the interval. Lajuana Matte., MD 11/10/2013

## 2013-11-06 NOTE — Telephone Encounter (Signed)
gv pt appt schedule for december and january via interpreter. pt will not have any blood 12/17 due to tx to long. both units of blood will be given 12/18. s/w Myriam Jacobson. pt/family aware.

## 2013-11-07 ENCOUNTER — Ambulatory Visit (HOSPITAL_BASED_OUTPATIENT_CLINIC_OR_DEPARTMENT_OTHER): Payer: Managed Care, Other (non HMO)

## 2013-11-07 ENCOUNTER — Other Ambulatory Visit: Payer: Self-pay | Admitting: Lab

## 2013-11-07 VITALS — BP 143/88 | HR 85 | Temp 98.1°F | Resp 20

## 2013-11-07 DIAGNOSIS — Z5111 Encounter for antineoplastic chemotherapy: Secondary | ICD-10-CM

## 2013-11-07 DIAGNOSIS — C221 Intrahepatic bile duct carcinoma: Secondary | ICD-10-CM

## 2013-11-07 MED ORDER — SODIUM CHLORIDE 0.9 % IV SOLN
800.0000 mg/m2 | Freq: Once | INTRAVENOUS | Status: AC
  Administered 2013-11-07: 1216 mg via INTRAVENOUS
  Filled 2013-11-07: qty 31.98

## 2013-11-07 MED ORDER — DEXAMETHASONE SODIUM PHOSPHATE 20 MG/5ML IJ SOLN
INTRAMUSCULAR | Status: AC
  Filled 2013-11-07: qty 5

## 2013-11-07 MED ORDER — PALONOSETRON HCL INJECTION 0.25 MG/5ML
0.2500 mg | Freq: Once | INTRAVENOUS | Status: AC
  Administered 2013-11-07: 0.25 mg via INTRAVENOUS

## 2013-11-07 MED ORDER — SODIUM CHLORIDE 0.9 % IV SOLN
150.0000 mg | Freq: Once | INTRAVENOUS | Status: AC
  Administered 2013-11-07: 150 mg via INTRAVENOUS
  Filled 2013-11-07: qty 5

## 2013-11-07 MED ORDER — SODIUM CHLORIDE 0.9 % IV SOLN
60.0000 mg/m2 | Freq: Once | INTRAVENOUS | Status: AC
  Administered 2013-11-07: 90 mg via INTRAVENOUS
  Filled 2013-11-07: qty 90

## 2013-11-07 MED ORDER — SODIUM CHLORIDE 0.9 % IJ SOLN
10.0000 mL | INTRAMUSCULAR | Status: DC | PRN
  Administered 2013-11-07: 10 mL via INTRAVENOUS
  Filled 2013-11-07: qty 10

## 2013-11-07 MED ORDER — POTASSIUM CHLORIDE 2 MEQ/ML IV SOLN
Freq: Once | INTRAVENOUS | Status: AC
  Administered 2013-11-07: 11:00:00 via INTRAVENOUS
  Filled 2013-11-07: qty 10

## 2013-11-07 MED ORDER — DEXAMETHASONE SODIUM PHOSPHATE 20 MG/5ML IJ SOLN
12.0000 mg | Freq: Once | INTRAMUSCULAR | Status: AC
  Administered 2013-11-07: 12 mg via INTRAVENOUS

## 2013-11-07 MED ORDER — SODIUM CHLORIDE 0.9 % IV SOLN
INTRAVENOUS | Status: DC
  Administered 2013-11-07: 11:00:00 via INTRAVENOUS

## 2013-11-07 MED ORDER — PALONOSETRON HCL INJECTION 0.25 MG/5ML
INTRAVENOUS | Status: AC
  Filled 2013-11-07: qty 5

## 2013-11-07 MED ORDER — HEPARIN SOD (PORK) LOCK FLUSH 100 UNIT/ML IV SOLN
500.0000 [IU] | Freq: Once | INTRAVENOUS | Status: AC
  Administered 2013-11-07: 500 [IU] via INTRAVENOUS
  Filled 2013-11-07: qty 5

## 2013-11-07 NOTE — Patient Instructions (Signed)
[facility] Discharge Instructions for Patients Receiving Chemotherapy  Today you received the following chemotherapy agents:  Cisplatin and Gemzar  To help prevent nausea and vomiting after your treatment, we encourage you to take your nausea medication as ordered per MD.   If you develop nausea and vomiting that is not controlled by your nausea medication, call the clinic.   BELOW ARE SYMPTOMS THAT SHOULD BE REPORTED IMMEDIATELY:  *FEVER GREATER THAN 100.5 F  *CHILLS WITH OR WITHOUT FEVER  NAUSEA AND VOMITING THAT IS NOT CONTROLLED WITH YOUR NAUSEA MEDICATION  *UNUSUAL SHORTNESS OF BREATH  *UNUSUAL BRUISING OR BLEEDING  TENDERNESS IN MOUTH AND THROAT WITH OR WITHOUT PRESENCE OF ULCERS  *URINARY PROBLEMS  *BOWEL PROBLEMS  UNUSUAL RASH Items with * indicate a potential emergency and should be followed up as soon as possible.  Feel free to call the clinic you have any questions or concerns. The clinic phone number is [phone].    

## 2013-11-07 NOTE — Progress Notes (Signed)
AROMATHERAPY:  Contacted by Melodie Bouillon RN to see patient for possible aromatherapy for anticipatory nausea and vomiting due to chemotherapy and triggered by smell.   Patient expressed desire to try aromatherapy.  Let her try samples of the peppermint, ginger-orange and lavender.  She feels the lavender would be best for her.  Left her with sample of lavender oil on gauze in small cup @10 :55am to use throughout the day.  She very much appreciated this and thinks it will help.  Discussed with Dr. Arbutus Ped and he is fine with the aromatherapy. 12:12pm  Checked on patient in her room and she says her nausea is much improved. 1317pm.  Pt. Nauseated again.  Refreshed her lavender oil and she got immediate relief. 1552.  Checked on patient.  She is sleeping at present, husband said she is fine.

## 2013-11-08 ENCOUNTER — Ambulatory Visit (HOSPITAL_BASED_OUTPATIENT_CLINIC_OR_DEPARTMENT_OTHER): Payer: Managed Care, Other (non HMO)

## 2013-11-08 VITALS — BP 153/93 | HR 72 | Temp 97.0°F | Resp 16

## 2013-11-08 DIAGNOSIS — D6481 Anemia due to antineoplastic chemotherapy: Secondary | ICD-10-CM

## 2013-11-08 MED ORDER — ACETAMINOPHEN 325 MG PO TABS
ORAL_TABLET | ORAL | Status: AC
  Filled 2013-11-08: qty 2

## 2013-11-08 MED ORDER — LORAZEPAM 2 MG/ML IJ SOLN: 0.5000 mg | Freq: Once | INTRAMUSCULAR | Status: DC

## 2013-11-08 MED ORDER — SODIUM CHLORIDE 0.9 % IV SOLN: 250.0000 mL | Freq: Once | INTRAVENOUS | Status: DC

## 2013-11-08 MED ORDER — DIPHENHYDRAMINE HCL 25 MG PO CAPS
25.0000 mg | ORAL_CAPSULE | Freq: Once | ORAL | Status: AC
  Administered 2013-11-08: 25 mg via ORAL

## 2013-11-08 MED ORDER — HEPARIN SOD (PORK) LOCK FLUSH 100 UNIT/ML IV SOLN
500.0000 [IU] | Freq: Every day | INTRAVENOUS | Status: AC | PRN
  Administered 2013-11-08: 500 [IU]
  Filled 2013-11-08: qty 5

## 2013-11-08 MED ORDER — ACETAMINOPHEN 325 MG PO TABS
650.0000 mg | ORAL_TABLET | Freq: Once | ORAL | Status: AC
  Administered 2013-11-08: 650 mg via ORAL

## 2013-11-08 MED ORDER — SODIUM CHLORIDE 0.9 % IJ SOLN
10.0000 mL | INTRAMUSCULAR | Status: AC | PRN
  Administered 2013-11-08: 10 mL
  Filled 2013-11-08: qty 10

## 2013-11-08 NOTE — Patient Instructions (Signed)
Informacin sobre la transfusin de sangre (Blood Transfusion Information) QU ES UNA TRANSFUSIN DE SANGRE? Una transfusin consiste en el reemplazo de todos o algunos de los componentes de la sangre. La sangre est formada por mltiples clulas que cumplen diferentes funciones.  Los glbulos rojos llevan el oxgeno y se usan para reemplazar prdidas sanguneas.  Los glbulos blancos luchan contra las infecciones.  Las plaquetas controlan las hemorragias.  El plasma provee factores coagulantes.  Existen otros productos disponibles para necesidades especiales como la hemofilia u otros problemas de coagulacin. ANTES DE LA TRANSFUSIN Quin puede donar sangre para transfusiones?   Usted puede donar sangre para ser utilizada ms tarde en usted mismo ( esto se denomina donacin autloga).  Se puede solicitar a los familiares que donen sangre. Esto, generalmente, no es ms seguro que si recibe sangre de un extrao.  Es segura la sangre de voluntarios sanos que hayan sido completamente evaluados. Esto es un banco de sangre. Una transfusin de sangre es ahora ms segura que nunca antes en la prctica de la medicina. Antes de obtener sangre de un donante, se le confecciona una historia completa para asegurarse de que esa persona no tenga antecedentes de enfermedades, ni est involucrado en conductas de riesgo (por ejemplo el uso de drogas intravenosas o actividad sexual con mltiples parejas). Se controlar el historial de viajes del donante para minimizar los riesgos de la transmisin de infecciones como la malaria. La sangre donada se analizar para descartar enfermedades infecciosas como VIH y hepatitis. Luego se probar para asegurarse de que es compatible con usted para minimizar las posibilidades de una reaccin a la transfusin. Si usted o un familiar suyo donan sangre, sto se realiza con anterioridad a una ciruga y no es adecuado en situaciones de emergencia. El proceso de donacin toma  varios das. RIESGOS Y COMPLICACIONES Aunque la terapia de transfusin es muy segura y salva muchas vidas, sus principales peligros incluyen:   El contacto con enfermedades infecciosas.  El desarrollo de una reaccin. Se trata de una reaccin alrgica a algn compuesto de la sangre que se recibi. Para prevenir esta situacin, se toman todas las medidas posibles. Estas decisiones son consideradas detenidamente por su mdico antes de la transfusin. No se realiza una transfusin de sangre a menos que los beneficios sean mayores que los riesgos. DESPUS DEL PROCEDIMIENTO  Inmediatamente despus de recibir una transfusin, se sentir mucho mejor y con ms energa. Esto se percibe especialmente si usted estaba muy anmico. Significa que sus glbulos rojos haban disminuido. La transfusin eleva el nivel de los glbulos rojos que son los que transportan oxgeno y esto generalmente hace que la energa aumente.  Los enfermeros que hayan administrado la transfusin lo controlarn cuidadosamente ante cualquier complicacin. INSTRUCCIONES PARA EL CUIDADO DOMICILIARIO No existen instrucciones especiales para despus de una transfusin. Se sentir ms enrgico. Hable con el mdico acerca de cualquier limitacin sobre las actividades respecto de las enfermedades subyacentes que pueden aparecer como un ataque cardaco.  SOLICITE ATENCIN MDICA SI:  Su enfermedad no mejora luego de la transfusin.  Desarrolla enrojecimiento o irritacin en la zona donde se ha colocado la lnea intravenosa. SOLICITE ATENCIN MDICA DE INMEDIATO SI: Aparecen algunos de los siguientes sntomas en las 12 horas siguientes:  Siente escalofros.  Usted tiene una temperatura oral de ms de 38,9 C (102 F) y no puede controlarla con medicamentos.  Dolores de espalda, pecho, o musculares.  Las personas que lo rodean creen que usted no acta correctamente o lo ven confundido.    Le falta la respiracin o presenta dificultades  respiratorias.  Mareos o sufre desmayos.  Desarrolla un sarpullido o urticaria.  Disminuye la cantidad [place].  La orina se torna de color oscuro o cambia a un color rosado, rojo o marrn. Aparecen algunos de los siguientes sntomas en los [address] posteriores a la transfusin:  Usted tiene una temperatura oral de ms de 38,9 C (102 F) y no puede controlarla con medicamentos.  Falta de aire.  Debilidad luego de las [address].  Disminucin en la cantidad de [place], u orina con menos frecuencia.  La parte [place] del ojo se vuelve amarilla ([place]).  El color de la orina se vuelve rosado, rojo o [place]. Document Released: 11/08/2005 Document Revised: 01/31/2012 [facility] Patient Information 2014 [place], [place].  It was a pleasure to serve you today !

## 2013-11-08 NOTE — Progress Notes (Signed)
AROMATHERAPY: Pt. Here for blood transfusion and requesting aromatherapy. 10:30am.  Pt. Here with family and translator.  Patient would like to use same oil as yesterday.  She did take a nausea pill compazine, but still having some nausea.  Through translator asked if she had any questions about the aromatherapy and she does not.  Gave her the lavender oil on gauze in a small cup.  She experiences very quick relief with the lavender oil. 12noon.  Pt. Sleeping.  15:15. Pt. Sleeping.  Family requested that I refresh her lavender.  Lavender oil refreshed for patient.

## 2013-11-08 NOTE — Progress Notes (Signed)
Reports nausea upon entry to infusion area-Notified William Hamburger, RN to provide aromatherapy. Also obtained order from [facility] for Ativan 0.5mg  IV for nausea. Patient already took Compazine at home this am. Patient later declines Ativan at this time-nausea wave improved after aromatherapy initiated. Explained with use of interpreter rationale for transfusion, procedure, risks and potential adverse events. Husband read consent out loud to patient, who signed it.

## 2013-11-09 LAB — TYPE AND SCREEN: Antibody Screen: NEGATIVE

## 2013-11-09 NOTE — Patient Instructions (Signed)
Your hemoglobin level is low and we will arrange to transfuse you 2 units of blood to address this anemia Continue with labs and chemotherapy as scheduled Followup in 3 weeks

## 2013-11-14 ENCOUNTER — Other Ambulatory Visit (HOSPITAL_BASED_OUTPATIENT_CLINIC_OR_DEPARTMENT_OTHER): Payer: Managed Care, Other (non HMO)

## 2013-11-14 ENCOUNTER — Ambulatory Visit (HOSPITAL_BASED_OUTPATIENT_CLINIC_OR_DEPARTMENT_OTHER): Payer: Managed Care, Other (non HMO)

## 2013-11-14 VITALS — BP 134/89 | HR 93 | Temp 98.3°F | Resp 18

## 2013-11-14 DIAGNOSIS — Z5111 Encounter for antineoplastic chemotherapy: Secondary | ICD-10-CM

## 2013-11-14 DIAGNOSIS — C221 Intrahepatic bile duct carcinoma: Secondary | ICD-10-CM

## 2013-11-14 LAB — CBC WITH DIFFERENTIAL/PLATELET
BASO%: 0.4 % (ref 0.0–2.0)
Basophils Absolute: 0 10*3/uL (ref 0.0–0.1)
EOS%: 0.4 % (ref 0.0–7.0)
Eosinophils Absolute: 0 10*3/uL (ref 0.0–0.5)
HGB: 13.1 g/dL (ref 11.6–15.9)
LYMPH%: 28.8 % (ref 14.0–49.7)
MCH: 31.4 pg (ref 25.1–34.0)
MCHC: 34.4 g/dL (ref 31.5–36.0)
MONO%: 12.5 % (ref 0.0–14.0)
Platelets: 225 10*3/uL (ref 145–400)
RBC: 4.17 10*6/uL (ref 3.70–5.45)
RDW: 14 % (ref 11.2–14.5)
lymph#: 0.7 10*3/uL — ABNORMAL LOW (ref 0.9–3.3)
nRBC: 0 % (ref 0–0)

## 2013-11-14 LAB — COMPREHENSIVE METABOLIC PANEL (CC13)
AST: 36 U/L — ABNORMAL HIGH (ref 5–34)
Albumin: 4.1 g/dL (ref 3.5–5.0)
Alkaline Phosphatase: 298 U/L — ABNORMAL HIGH (ref 40–150)
BUN: 21.6 mg/dL (ref 7.0–26.0)
CO2: 26 mEq/L (ref 22–29)
Creatinine: 0.8 mg/dL (ref 0.6–1.1)
Potassium: 4.3 mEq/L (ref 3.5–5.1)
Sodium: 132 mEq/L — ABNORMAL LOW (ref 136–145)
Total Bilirubin: 0.53 mg/dL (ref 0.20–1.20)
Total Protein: 7.5 g/dL (ref 6.4–8.3)

## 2013-11-14 MED ORDER — SODIUM CHLORIDE 0.9 % IJ SOLN
10.0000 mL | INTRAMUSCULAR | Status: DC | PRN
  Administered 2013-11-14: 10 mL
  Filled 2013-11-14: qty 10

## 2013-11-14 MED ORDER — PROCHLORPERAZINE EDISYLATE 5 MG/ML IJ SOLN
INTRAMUSCULAR | Status: AC
  Filled 2013-11-14: qty 2

## 2013-11-14 MED ORDER — SODIUM CHLORIDE 0.9 % IV SOLN
Freq: Once | INTRAVENOUS | Status: AC
  Administered 2013-11-14: 09:00:00 via INTRAVENOUS

## 2013-11-14 MED ORDER — SODIUM CHLORIDE 0.9 % IV SOLN
800.0000 mg/m2 | Freq: Once | INTRAVENOUS | Status: AC
  Administered 2013-11-14: 1216 mg via INTRAVENOUS
  Filled 2013-11-14: qty 32

## 2013-11-14 MED ORDER — PROCHLORPERAZINE EDISYLATE 5 MG/ML IJ SOLN
10.0000 mg | Freq: Once | INTRAMUSCULAR | Status: AC
  Administered 2013-11-14: 10 mg via INTRAVENOUS

## 2013-11-14 MED ORDER — HEPARIN SOD (PORK) LOCK FLUSH 100 UNIT/ML IV SOLN
500.0000 [IU] | Freq: Once | INTRAVENOUS | Status: AC | PRN
  Administered 2013-11-14: 500 [IU]
  Filled 2013-11-14: qty 5

## 2013-11-14 NOTE — Patient Instructions (Signed)
[facility] Discharge Instructions for Patients Receiving Chemotherapy  Today you received the following chemotherapy agent: Gemzar   To help prevent nausea and vomiting after your treatment, we encourage you to take your nausea medication as prescribed.    If you develop nausea and vomiting that is not controlled by your nausea medication, call the clinic.   BELOW ARE SYMPTOMS THAT SHOULD BE REPORTED IMMEDIATELY:  *FEVER GREATER THAN 100.5 F  *CHILLS WITH OR WITHOUT FEVER  NAUSEA AND VOMITING THAT IS NOT CONTROLLED WITH YOUR NAUSEA MEDICATION  *UNUSUAL SHORTNESS OF BREATH  *UNUSUAL BRUISING OR BLEEDING  TENDERNESS IN MOUTH AND THROAT WITH OR WITHOUT PRESENCE OF ULCERS  *URINARY PROBLEMS  *BOWEL PROBLEMS  UNUSUAL RASH Items with * indicate a potential emergency and should be followed up as soon as possible.  Feel free to call the clinic you have any questions or concerns. The clinic phone number is [phone].    

## 2013-11-14 NOTE — Progress Notes (Signed)
Per Dr. Arbutus Ped, OK to treat with gemzar with ANC 1.4 today

## 2013-11-19 ENCOUNTER — Telehealth: Payer: Self-pay | Admitting: *Deleted

## 2013-11-19 NOTE — Telephone Encounter (Signed)
Pt's husband called with a spanish interpreter, stating that pt is c/o vomiting, cough and congestion.  She had a temp of 103 yesterday, today it is 98.6.  She is coughing up white phlegm.  He states he called the on call doctor yesterday (Sunday) but did not give a # to call back.  Pt is only taking compazine for nausea, she has zofran, ativan, and phenergan at home.  He states she does not like to take the other nausea medication because it makes her tired.    Per Dr Donnald Garre, pt needs to take nausea medication as prescribed, and they need to call back if her fever returns.  Called pt's husband back with an interpreter.  No answer, interpreter left message with instructions.  Also advised she take mucinex to help with congestion and to call back with any questions.  SLJ

## 2013-11-20 ENCOUNTER — Encounter (HOSPITAL_COMMUNITY): Payer: Self-pay | Admitting: Emergency Medicine

## 2013-11-20 ENCOUNTER — Inpatient Hospital Stay (HOSPITAL_COMMUNITY)
Admission: EM | Admit: 2013-11-20 | Discharge: 2013-11-21 | DRG: 202 | Disposition: A | Discharge status: Home / Self Care | Payer: Managed Care, Other (non HMO) | Attending: Internal Medicine | Admitting: Internal Medicine

## 2013-11-20 ENCOUNTER — Emergency Department (HOSPITAL_COMMUNITY): Payer: Managed Care, Other (non HMO)

## 2013-11-20 DIAGNOSIS — M1990 Unspecified osteoarthritis, unspecified site: Secondary | ICD-10-CM

## 2013-11-20 DIAGNOSIS — J209 Acute bronchitis, unspecified: Principal | ICD-10-CM | POA: Diagnosis present

## 2013-11-20 DIAGNOSIS — C229 Malignant neoplasm of liver, not specified as primary or secondary: Secondary | ICD-10-CM | POA: Diagnosis present

## 2013-11-20 DIAGNOSIS — R509 Fever, unspecified: Secondary | ICD-10-CM | POA: Diagnosis present

## 2013-11-20 DIAGNOSIS — R059 Cough, unspecified: Secondary | ICD-10-CM

## 2013-11-20 DIAGNOSIS — Z889 Allergy status to unspecified drugs, medicaments and biological substances status: Secondary | ICD-10-CM

## 2013-11-20 DIAGNOSIS — M129 Arthropathy, unspecified: Secondary | ICD-10-CM | POA: Diagnosis present

## 2013-11-20 DIAGNOSIS — R109 Unspecified abdominal pain: Secondary | ICD-10-CM | POA: Diagnosis present

## 2013-11-20 DIAGNOSIS — Z79899 Other long term (current) drug therapy: Secondary | ICD-10-CM

## 2013-11-20 DIAGNOSIS — Z0000 Encounter for general adult medical examination without abnormal findings: Secondary | ICD-10-CM

## 2013-11-20 DIAGNOSIS — C221 Intrahepatic bile duct carcinoma: Secondary | ICD-10-CM | POA: Diagnosis present

## 2013-11-20 DIAGNOSIS — D62 Acute posthemorrhagic anemia: Secondary | ICD-10-CM | POA: Diagnosis present

## 2013-11-20 DIAGNOSIS — E876 Hypokalemia: Secondary | ICD-10-CM | POA: Diagnosis present

## 2013-11-20 DIAGNOSIS — R1013 Epigastric pain: Secondary | ICD-10-CM | POA: Diagnosis present

## 2013-11-20 DIAGNOSIS — I10 Essential (primary) hypertension: Secondary | ICD-10-CM

## 2013-11-20 DIAGNOSIS — D6481 Anemia due to antineoplastic chemotherapy: Secondary | ICD-10-CM | POA: Diagnosis present

## 2013-11-20 DIAGNOSIS — E7800 Pure hypercholesterolemia, unspecified: Secondary | ICD-10-CM

## 2013-11-20 DIAGNOSIS — D696 Thrombocytopenia, unspecified: Secondary | ICD-10-CM | POA: Diagnosis present

## 2013-11-20 DIAGNOSIS — R05 Cough: Secondary | ICD-10-CM

## 2013-11-20 DIAGNOSIS — E871 Hypo-osmolality and hyponatremia: Secondary | ICD-10-CM | POA: Diagnosis present

## 2013-11-20 LAB — URINALYSIS, ROUTINE W REFLEX MICROSCOPIC
Ketones, ur: NEGATIVE mg/dL
Leukocytes, UA: NEGATIVE
Nitrite: NEGATIVE
Specific Gravity, Urine: 1.03 (ref 1.005–1.030)
Urobilinogen, UA: 1 mg/dL (ref 0.0–1.0)

## 2013-11-20 LAB — CBC WITH DIFFERENTIAL/PLATELET
Basophils Absolute: 0 10*3/uL (ref 0.0–0.1)
Eosinophils Absolute: 0 10*3/uL (ref 0.0–0.7)
Eosinophils Relative: 0 % (ref 0–5)
HCT: 31.1 % — ABNORMAL LOW (ref 36.0–46.0)
Lymphs Abs: 0.2 10*3/uL — ABNORMAL LOW (ref 0.7–4.0)
MCH: 31.5 pg (ref 26.0–34.0)
MCV: 88.4 fL (ref 78.0–100.0)
Monocytes Absolute: 0.5 10*3/uL (ref 0.1–1.0)
Monocytes Relative: 11 % (ref 3–12)
Neutro Abs: 4.2 10*3/uL (ref 1.7–7.7)
Platelets: 54 10*3/uL — ABNORMAL LOW (ref 150–400)
RBC: 3.52 MIL/uL — ABNORMAL LOW (ref 3.87–5.11)
RDW: 13.8 % (ref 11.5–15.5)

## 2013-11-20 LAB — URINE MICROSCOPIC-ADD ON

## 2013-11-20 LAB — COMPREHENSIVE METABOLIC PANEL
AST: 33 U/L (ref 0–37)
BUN: 18 mg/dL (ref 6–23)
CO2: 26 mEq/L (ref 19–32)
Calcium: 9.3 mg/dL (ref 8.4–10.5)
Chloride: 83 mEq/L — ABNORMAL LOW (ref 96–112)
Creatinine, Ser: 0.65 mg/dL (ref 0.50–1.10)
GFR calc non Af Amer: >90 mL/min (ref >90)
Total Bilirubin: 0.4 mg/dL (ref 0.3–1.2)
Total Protein: 7.2 g/dL (ref 6.0–8.3)

## 2013-11-20 LAB — LIPASE, BLOOD: Lipase: 21 U/L (ref 11–59)

## 2013-11-20 MED ORDER — ONDANSETRON HCL 4 MG/2ML IJ SOLN
4.0000 mg | Freq: Four times a day (QID) | INTRAMUSCULAR | Status: DC | PRN
  Administered 2013-11-21: 06:00:00 4 mg via INTRAVENOUS
  Filled 2013-11-20: qty 2

## 2013-11-20 MED ORDER — SODIUM CHLORIDE 0.9 % IV SOLN
INTRAVENOUS | Status: DC
  Administered 2013-11-20: 23:00:00 via INTRAVENOUS

## 2013-11-20 MED ORDER — PROMETHAZINE HCL 25 MG/ML IJ SOLN: 25.0000 mg | Freq: Four times a day (QID) | INTRAMUSCULAR | Status: DC | PRN

## 2013-11-20 MED ORDER — ONDANSETRON HCL 4 MG/2ML IJ SOLN
4.0000 mg | Freq: Once | INTRAMUSCULAR | Status: AC
  Administered 2013-11-20: 4 mg via INTRAVENOUS
  Filled 2013-11-20: qty 2

## 2013-11-20 MED ORDER — LORAZEPAM 0.5 MG PO TABS: 0.5000 mg | ORAL_TABLET | Freq: Three times a day (TID) | ORAL | Status: DC | PRN

## 2013-11-20 MED ORDER — MORPHINE SULFATE 4 MG/ML IJ SOLN
4.0000 mg | Freq: Once | INTRAMUSCULAR | Status: AC
  Administered 2013-11-20: 4 mg via INTRAVENOUS
  Filled 2013-11-20: qty 1

## 2013-11-20 MED ORDER — HYDROMORPHONE HCL PF 1 MG/ML IJ SOLN: 1.0000 mg | INTRAMUSCULAR | Status: DC | PRN

## 2013-11-20 MED ORDER — ONDANSETRON HCL 4 MG PO TABS: 4.0000 mg | ORAL_TABLET | Freq: Four times a day (QID) | ORAL | Status: DC | PRN

## 2013-11-20 MED ORDER — HYDROCODONE-ACETAMINOPHEN 5-325 MG PO TABS: 1.0000 | ORAL_TABLET | ORAL | Status: DC | PRN

## 2013-11-20 MED ORDER — SODIUM CHLORIDE 0.9 % IV BOLUS (SEPSIS)
1000.0000 mL | Freq: Once | INTRAVENOUS | Status: AC
  Administered 2013-11-20: 1000 mL via INTRAVENOUS

## 2013-11-20 NOTE — H&P (Signed)
[facility] History and Physical  Tammy [name] ZOX:[id number] DOB: 1956/09/22 DOA: 11/20/2013  Referring physician: ED physician PCP: Lora Paula, MD   Chief Complaint: Fevers and chills  HPI:  Patient is 57 year old female with liver cancer, undergoing active chemotherapy under the care of Dr. Arbutus Ped. She presents to [facility] long emergency department with main concern of progressively worsening epigastric pain that started several days prior to admission, throbbing, 5/10 in severity, no specific radiating symptoms, no specific alleviating or aggravating factors, associated with nausea and nonbloody vomiting. She denies similar events in the past. She explains this has also been associated with subjective fevers and chills, no chest pain or shortness of breath, no specific urinary concerns.  In the emergency department patient found to be febrile with temperature 101.5, slightly tachycardic with heart rate of 105.bpm. Chest x-ray and abdominal x-ray unremarkable for acute findings. [facility] asked to admit for further evaluation. Please note that patient has been scheduled for CT scan of the abdomen and pelvis 11/21/2013. Will order scans while patient is in the hospital.   Assessment and Plan: Abdominal pain - Unclear etiology at this time - We'll proceed with CT scan of the abdomen and pelvis as this was already scheduled for 11/21/2013 - Will admit patient to medical floor and will place patient on antiemetics and analgesia to help with symptom control Fever of unclear etiology - Influenza panel ordered - Placed on empiric Tamiflu until results are available Acute blood loss anemia - Unclear etiology, FOBT ordered - Use SCDs for DVT prophylaxis instead of heparin products - Repeat CBC in the morning Thrombocytopenia - Significant drop since last baseline 11/14/2013 when platelet count was 220 5K Hyponatremia - Possibly prerenal in etiology and secondary to  poor oral intake - Provide IV fluids and repeat BMP in the morning  Code Status: Full Family Communication: Pt and family at bedside Disposition Plan: Admit to medical floor  Review of Systems:  Constitutional: Negative for diaphoresis.  HENT: Negative for hearing loss, ear pain, nosebleeds, congestion, sore throat, neck pain, tinnitus and ear discharge.   Eyes: Negative for blurred vision, double vision, photophobia, pain, discharge and redness.  Respiratory: Negative for cough, hemoptysis, sputum production, shortness of breath, wheezing and stridor.   Cardiovascular: Negative for chest pain, palpitations, orthopnea, claudication and leg swelling.  Gastrointestinal: Negative for heartburn, constipation, blood in stool and melena.  Genitourinary: Negative for dysuria, urgency, frequency, hematuria and flank pain.  Musculoskeletal: Negative for myalgias, back pain, joint pain and falls.  Skin: Negative for itching and rash.  Neurological: Negative for tingling, tremors, sensory change, speech change, focal weakness, loss of consciousness and headaches.  Endo/Heme/Allergies: Negative for environmental allergies and polydipsia. Does not bruise/bleed easily.  Psychiatric/Behavioral: Negative for suicidal ideas. The patient is not nervous/anxious.      Past Medical History  Diagnosis Date  . Arthritis   . Polyp of colon 11/14/2008    seen on colonoscopy, f/u colonoscopy recommended in 5 years.   . Tumor liver 05/01/13    Past Surgical History  Procedure Laterality Date  . Abdominal hysterectomy  01/24/2007    including cervix, benign    Social History:  reports that she has never smoked. She has never used smokeless tobacco. She reports that she does not drink alcohol or use illicit drugs.  Allergies  Allergen Reactions  . Tramadol Nausea And Vomiting    headache    No known family medical history  Prior to Admission medications  Medication Sig Start Date End Date Taking?  Authorizing Provider  ibuprofen (ADVIL,MOTRIN) 200 MG tablet Take 400 mg by mouth every 6 (six) hours as needed for fever or moderate pain.   Yes Historical Provider, MD  lidocaine-prilocaine (EMLA) cream Apply topically as needed. Apply to port 1 hr before chemo 05/21/13  Yes Si Gaul, MD  LORazepam (ATIVAN) 0.5 MG tablet Take 1 tablet (0.5 mg total) by mouth every 8 (eight) hours as needed for anxiety. 09/26/13  Yes Si Gaul, MD  prochlorperazine (COMPAZINE) 10 MG tablet Take 10 mg by mouth every 6 (six) hours as needed for nausea or vomiting.   Yes Historical Provider, MD  promethazine (PHENERGAN) 25 MG tablet Take 1 tablet (25 mg total) by mouth every 8 (eight) hours as needed for nausea or vomiting. 10/03/13  Yes Si Gaul, MD    Physical Exam: Filed Vitals:   11/20/13 1420 11/20/13 1426 11/20/13 1500 11/20/13 1608  BP: 137/78 125/72 133/69 133/69  Pulse: 116 105 103 105  Temp:    101 F (38.3 C)  TempSrc:    Oral  Resp: 13 13 23 18   SpO2: 100% 97% 100%     Physical Exam  Constitutional: Appears well-developed and well-nourished. No distress.  HENT: Normocephalic. External right and left ear normal. Oropharynx is clear and moist.  Eyes: Conjunctivae and EOM are normal. PERRLA, no scleral icterus.  Neck: Normal ROM. Neck supple. No JVD. No tracheal deviation. No thyromegaly.  CVS: Regular rhythm, tachycardic, S1/S2 +, no murmurs, no gallops, no carotid bruit.  Pulmonary: Effort and breath sounds normal, no stridor, rhonchi, wheezes, rales.  Abdominal: Soft. BS +,  no distension, tenderness in epigastric area with no rebound or guarding.  Musculoskeletal: Normal range of motion. No edema and no tenderness.  Lymphadenopathy: No lymphadenopathy noted, cervical, inguinal. Neuro: Alert. Normal reflexes, muscle tone coordination. No cranial nerve deficit. Skin: Skin is warm and dry. No rash noted. Not diaphoretic. No erythema. No pallor.  Psychiatric: Normal mood and  affect. Behavior, judgment, thought content normal.   Labs on Admission:  Basic Metabolic Panel:  Recent Labs Lab 11/14/13 0828 11/20/13 1259  NA 132* 124*  K 4.3 3.9  CL  --  83*  CO2 26 26  GLUCOSE 126 154*  BUN 21.6 18  CREATININE 0.8 0.65  CALCIUM 9.7 9.3   Liver Function Tests:  Recent Labs Lab 11/14/13 0828 11/20/13 1259  AST 36* 33  ALT 41 23  ALKPHOS 298* 211*  BILITOT 0.53 0.4  PROT 7.5 7.2  ALBUMIN 4.1 3.6    Recent Labs Lab 11/20/13 1259  LIPASE 21   CBC:  Recent Labs Lab 11/14/13 0828 11/20/13 1259  WBC 2.4* 4.9  NEUTROABS 1.4* 4.2  HGB 13.1 11.1*  HCT 38.1 31.1*  MCV 91.4 88.4  PLT 225 54*    Radiological Exams on Admission: Dg Chest 2 View  11/20/2013   No active disease.    Dg Abd 2 Views  11/20/2013  Negative abdominal radiographs.     EKG: Normal sinus rhythm, no ST/T wave changes  Debbora Presto, MD  [facility] Pager [phone]  If 7PM-7AM, please contact night-coverage [email or web address] Password [facility] 11/20/2013, 4:36 PM

## 2013-11-20 NOTE — ED Provider Notes (Signed)
CSN: [id number]     Arrival date & time 11/20/13  1157 History   First MD Initiated Contact with Patient 11/20/13 1220     Chief Complaint  Patient presents with  . flu like symptoms   . Nausea  . Emesis   (Consider location/radiation/quality/duration/timing/severity/associated sxs/prior Treatment) HPI Comments: Patient presents to the ED with a chief complaint of abdominal pain, nausea, and vomiting.  She has a history of liver cancer, and is currently getting chemo.  She is followed by Dr. Tanja Port.  She denies bloody vomit or stools.  She states that her symptoms are moderate in severity.  The history is provided by the patient. The history is limited by a language barrier. A language interpreter was used.    Past Medical History  Diagnosis Date  . Arthritis   . Polyp of colon 11/14/2008    seen on colonoscopy, f/u colonoscopy recommended in 5 years.   . Tumor liver 05/01/13   Past Surgical History  Procedure Laterality Date  . Abdominal hysterectomy  01/24/2007    including cervix, benign   No family history on file. History  Substance Use Topics  . Smoking status: Never Smoker   . Smokeless tobacco: Never Used  . Alcohol Use: No   OB History   Grav Para Term Preterm Abortions TAB SAB Ect Mult Living                 Review of Systems  All other systems reviewed and are negative.    Allergies  Tramadol  Home Medications   Current Outpatient Rx  Name  Route  Sig  Dispense  Refill  . ibuprofen (ADVIL,MOTRIN) 200 MG tablet   Oral   Take 400 mg by mouth every 6 (six) hours as needed for fever or moderate pain.         Marland Kitchen lidocaine-prilocaine (EMLA) cream   Topical   Apply topically as needed. Apply to port 1 hr before chemo   30 g   0   . LORazepam (ATIVAN) 0.5 MG tablet   Oral   Take 1 tablet (0.5 mg total) by mouth every 8 (eight) hours as needed for anxiety.   30 tablet   0   . prochlorperazine (COMPAZINE) 10 MG tablet   Oral   Take 10 mg by mouth  every 6 (six) hours as needed for nausea or vomiting.         . promethazine (PHENERGAN) 25 MG tablet   Oral   Take 1 tablet (25 mg total) by mouth every 8 (eight) hours as needed for nausea or vomiting.   30 tablet   0    BP 129/76  Pulse 119  Temp(Src) 100.2 F (37.9 C) (Oral)  Resp 16  SpO2 98% Physical Exam  Nursing note and vitals reviewed. Constitutional: She is oriented to person, place, and time. She appears well-developed and well-nourished.  HENT:  Head: Normocephalic and atraumatic.  Eyes: Conjunctivae and EOM are normal. Pupils are equal, round, and reactive to light.  Neck: Normal range of motion. Neck supple.  Cardiovascular: Normal rate and regular rhythm.  Exam reveals no gallop and no friction rub.   No murmur heard. Pulmonary/Chest: Effort normal and breath sounds normal. No respiratory distress. She has no wheezes. She has no rales. She exhibits no tenderness.  Abdominal: Soft. Bowel sounds are normal. She exhibits no distension and no mass. There is tenderness. There is no rebound and no guarding.  Upper abdominal tenderness, no focal  lower abdominal tenderness  Musculoskeletal: Normal range of motion. She exhibits no edema and no tenderness.  Neurological: She is alert and oriented to person, place, and time.  Skin: Skin is warm and dry.  Psychiatric: She has a normal mood and affect. Her behavior is normal. Judgment and thought content normal.    ED Course  Procedures (including critical care time) Results for orders placed during the hospital encounter of 11/20/13  CBC WITH DIFFERENTIAL      Result Value Range   WBC 4.9  4.0 - 10.5 K/uL   RBC 3.52 (*) 3.87 - 5.11 MIL/uL   Hemoglobin 11.1 (*) 12.0 - 15.0 g/dL   HCT 16.1 (*) 09.6 - 04.5 %   MCV 88.4  78.0 - 100.0 fL   MCH 31.5  26.0 - 34.0 pg   MCHC 35.7  30.0 - 36.0 g/dL   RDW 40.9  81.1 - 91.4 %   Platelets 54 (*) 150 - 400 K/uL   Neutrophils Relative % 85 (*) 43 - 77 %   Lymphocytes Relative 4  (*) 12 - 46 %   Monocytes Relative 11  3 - 12 %   Eosinophils Relative 0  0 - 5 %   Basophils Relative 0  0 - 1 %   Neutro Abs 4.2  1.7 - 7.7 K/uL   Lymphs Abs 0.2 (*) 0.7 - 4.0 K/uL   Monocytes Absolute 0.5  0.1 - 1.0 K/uL   Eosinophils Absolute 0.0  0.0 - 0.7 K/uL   Basophils Absolute 0.0  0.0 - 0.1 K/uL   Smear Review MORPHOLOGY UNREMARKABLE    COMPREHENSIVE METABOLIC PANEL      Result Value Range   Sodium 124 (*) 137 - 147 mEq/L   Potassium 3.9  3.7 - 5.3 mEq/L   Chloride 83 (*) 96 - 112 mEq/L   CO2 26  19 - 32 mEq/L   Glucose, Bld 154 (*) 70 - 99 mg/dL   BUN 18  6 - 23 mg/dL   Creatinine, Ser 7.82  0.50 - 1.10 mg/dL   Calcium 9.3  8.4 - 95.6 mg/dL   Total Protein 7.2  6.0 - 8.3 g/dL   Albumin 3.6  3.5 - 5.2 g/dL   AST 33  0 - 37 U/L   ALT 23  0 - 35 U/L   Alkaline Phosphatase 211 (*) 39 - 117 U/L   Total Bilirubin 0.4  0.3 - 1.2 mg/dL   GFR calc non Af Amer >90  >90 mL/min   GFR calc Af Amer >90  >90 mL/min  LIPASE, BLOOD      Result Value Range   Lipase 21  11 - 59 U/L   Dg Chest 2 View  11/20/2013   CLINICAL DATA:  Nausea and vomiting.  Chest pain.  Chemotherapy.  EXAM: CHEST  2 VIEW  COMPARISON:  01/20/2007  FINDINGS: Artifact overlies the chest. Power port on the right has its tip in the SVC above the right atrium. Heart size is normal. Mediastinal shadows are normal. The lungs are clear. No effusions. No bony abnormalities.  IMPRESSION: No active disease.   Electronically Signed   By: Paulina Fusi M.D.   On: 11/20/2013 14:10   Dg Abd 2 Views  11/20/2013   CLINICAL DATA:  Nausea and vomiting.  EXAM: ABDOMEN - 2 VIEW  COMPARISON:  CT 09/19/2013  FINDINGS: Bowel gas pattern is normal without evidence of ileus, obstruction or free air. No abnormal calcifications or bony findings.  IMPRESSION: Negative abdominal radiographs.   Electronically Signed   By: Paulina Fusi M.D.   On: 11/20/2013 14:11      EKG Interpretation   None       MDM   1. Cough   2. Fever       Clinical dehydration in addition to probable flu.  Patient is immune compromised.  Currently getting treatment for liver cancer.  Will admit to the hospital.  Discussed the patient with Dr. Elesa Massed, who agrees with the plan.  3:09 PM Discussed the patient with Dr. Izola Price, who will admit the patient.  Roxy Horseman, PA-C 11/20/13 [phone]

## 2013-11-20 NOTE — ED Notes (Addendum)
Pt presents to ed with c/o nasal congestion, nausea and vomiting since Sunday. Pt reports having upper abdominal pain from vomiting. Pt had last chemo on 12 /24. Pt reports chemo does make her nauseous but not to this extent.

## 2013-11-20 NOTE — Progress Notes (Signed)
Pt admitted to the unit. Pt is alert and oriented. Pt oriented to room, staff, and call bell. Bed in lowest position. Full assessment to Epic. Call bell with in reach. Told to call for assists. Will continue to monitor.  Tammy [name] E  

## 2013-11-20 NOTE — ED Provider Notes (Signed)
Medical screening examination/treatment/procedure(s) were performed by non-physician practitioner and as supervising physician I was immediately available for consultation/collaboration.  EKG Interpretation   None         Layla Maw Nyeem Stoke, DO 11/20/13 1622

## 2013-11-20 NOTE — Progress Notes (Signed)
UR completed 

## 2013-11-20 NOTE — Progress Notes (Signed)
Transferred to [facility] at patient's request, deemed to be determined by flow manager

## 2013-11-21 ENCOUNTER — Inpatient Hospital Stay (HOSPITAL_COMMUNITY): Payer: Managed Care, Other (non HMO)

## 2013-11-21 ENCOUNTER — Ambulatory Visit (HOSPITAL_COMMUNITY)
Admission: RE | Admit: 2013-11-21 | Discharge: 2013-11-21 | Disposition: A | Discharge status: Home / Self Care | Payer: Managed Care, Other (non HMO) | Source: Ambulatory Visit | Attending: Physician Assistant | Admitting: Physician Assistant

## 2013-11-21 ENCOUNTER — Other Ambulatory Visit: Payer: Self-pay

## 2013-11-21 DIAGNOSIS — T451X5A Adverse effect of antineoplastic and immunosuppressive drugs, initial encounter: Secondary | ICD-10-CM

## 2013-11-21 DIAGNOSIS — D696 Thrombocytopenia, unspecified: Secondary | ICD-10-CM

## 2013-11-21 DIAGNOSIS — I10 Essential (primary) hypertension: Secondary | ICD-10-CM

## 2013-11-21 DIAGNOSIS — C221 Intrahepatic bile duct carcinoma: Secondary | ICD-10-CM

## 2013-11-21 DIAGNOSIS — E876 Hypokalemia: Secondary | ICD-10-CM

## 2013-11-21 DIAGNOSIS — J209 Acute bronchitis, unspecified: Principal | ICD-10-CM | POA: Diagnosis present

## 2013-11-21 DIAGNOSIS — D6481 Anemia due to antineoplastic chemotherapy: Secondary | ICD-10-CM

## 2013-11-21 DIAGNOSIS — R109 Unspecified abdominal pain: Secondary | ICD-10-CM

## 2013-11-21 DIAGNOSIS — E871 Hypo-osmolality and hyponatremia: Secondary | ICD-10-CM | POA: Diagnosis present

## 2013-11-21 LAB — BASIC METABOLIC PANEL WITH GFR
BUN: 9 mg/dL (ref 6–23)
CO2: 25 meq/L (ref 19–32)
Calcium: 8.3 mg/dL — ABNORMAL LOW (ref 8.4–10.5)
Chloride: 87 meq/L — ABNORMAL LOW (ref 96–112)
Creatinine, Ser: 0.53 mg/dL (ref 0.50–1.10)
GFR calc Af Amer: >90 mL/min (ref >90)
GFR calc non Af Amer: >90 mL/min (ref >90)
Glucose, Bld: 94 mg/dL (ref 70–99)
Potassium: 3 meq/L — ABNORMAL LOW (ref 3.7–5.3)
Sodium: 126 meq/L — ABNORMAL LOW (ref 137–147)

## 2013-11-21 LAB — CBC
HCT: 27.1 % — ABNORMAL LOW (ref 36.0–46.0)
Hemoglobin: 9.6 g/dL — ABNORMAL LOW (ref 12.0–15.0)
MCHC: 35.4 g/dL (ref 30.0–36.0)
MCV: 89.1 fL (ref 78.0–100.0)
RBC: 3.04 MIL/uL — ABNORMAL LOW (ref 3.87–5.11)
WBC: 4 10*3/uL (ref 4.0–10.5)

## 2013-11-21 LAB — INFLUENZA PANEL BY PCR (TYPE A & B)
H1N1 flu by pcr: NOT DETECTED
Influenza A By PCR: NEGATIVE
Influenza B By PCR: NEGATIVE

## 2013-11-21 MED ORDER — POTASSIUM CHLORIDE CRYS ER 20 MEQ PO TBCR
40.0000 meq | EXTENDED_RELEASE_TABLET | Freq: Once | ORAL | Status: AC
  Administered 2013-11-21: 40 meq via ORAL
  Filled 2013-11-21: qty 2

## 2013-11-21 MED ORDER — ACETAMINOPHEN 325 MG PO TABS
650.0000 mg | ORAL_TABLET | ORAL | Status: DC | PRN
  Administered 2013-11-21: 650 mg via ORAL
  Filled 2013-11-21: qty 2

## 2013-11-21 MED ORDER — PROCHLORPERAZINE MALEATE 10 MG PO TABS: 10.0000 mg | ORAL_TABLET | Freq: Four times a day (QID) | ORAL | Status: AC | PRN

## 2013-11-21 MED ORDER — IOHEXOL 300 MG/ML  SOLN
25.0000 mL | INTRAMUSCULAR | Status: AC
  Administered 2013-11-21 (×2): 25 mL via ORAL

## 2013-11-21 MED ORDER — OXYCODONE HCL 5 MG PO CAPS: 5.0000 mg | ORAL_CAPSULE | ORAL | Status: DC | PRN

## 2013-11-21 MED ORDER — AZITHROMYCIN 500 MG PO TABS
500.0000 mg | ORAL_TABLET | Freq: Once | ORAL | Status: AC
  Administered 2013-11-21: 15:00:00 500 mg via ORAL
  Filled 2013-11-21: qty 1

## 2013-11-21 MED ORDER — AZITHROMYCIN 250 MG PO TABS: 250.0000 mg | ORAL_TABLET | Freq: Every day | ORAL | Status: DC

## 2013-11-21 MED ORDER — IOHEXOL 300 MG/ML  SOLN
80.0000 mL | Freq: Once | INTRAMUSCULAR | Status: AC | PRN
  Administered 2013-11-21: 09:00:00 80 mL via INTRAVENOUS

## 2013-11-21 NOTE — Discharge Summary (Signed)
NURSING PROGRESS NOTE  Tammy [name] [id number] Discharge Data: 11/21/2013 5:12 PM Attending Provider: Christiane Ha, MD GMW:[id number], Ellison Carwin, MD     Darletta Moll to be D/C'd Home per MD order.  Utilized interpreter phone and discussed with the patient and her husband the After Visit Summary and all questions fully answered. All IV's discontinued with no bleeding noted. All belongings returned to patient for patient to take home. Prescriptions provided and medication information printed in spanish for all new Rx.   Last Vital Signs:  Blood pressure 130/102, pulse 96, temperature 97.4 F (36.3 C), temperature source Oral, resp. rate 18, height 5' (1.524 m), weight 50.9 kg (112 lb 3.4 oz), SpO2 97.00%.  Discharge Medication List   Medication List         azithromycin 250 MG tablet  Commonly known as:  ZITHROMAX  Take 1 tablet (250 mg total) by mouth daily.  Start taking on:  11/22/2013     ibuprofen 200 MG tablet  Commonly known as:  ADVIL,MOTRIN  Take 400 mg by mouth every 6 (six) hours as needed for fever or moderate pain.     lidocaine-prilocaine cream  Commonly known as:  EMLA  Apply topically as needed. Apply to port 1 hr before chemo     LORazepam 0.5 MG tablet  Commonly known as:  ATIVAN  Take 1 tablet (0.5 mg total) by mouth every 8 (eight) hours as needed for anxiety.     oxycodone 5 MG capsule  Commonly known as:  OXY-IR  Take 1 capsule (5 mg total) by mouth every 4 (four) hours as needed.     prochlorperazine 10 MG tablet  Commonly known as:  COMPAZINE  Take 1 tablet (10 mg total) by mouth every 6 (six) hours as needed for nausea or vomiting.     promethazine 25 MG tablet  Commonly known as:  PHENERGAN  Take 1 tablet (25 mg total) by mouth every 8 (eight) hours as needed for nausea or vomiting.

## 2013-11-21 NOTE — Discharge Summary (Signed)
Physician Discharge Summary  Tammy [name] JXB:[id number] DOB: 11-Sep-1956 DOA: 11/20/2013  PCP: Lora Paula, MD  Admit date: 11/20/2013 Discharge date: 11/21/2013  Time spent: greater than 30 minutes  Recommendations for Outpatient Follow-up:  1. Dr. Shirline Frees to discuss CT abd results with pt  Discharge Diagnoses:    Acute bronchitis Active Problems:   Cholangiocarcinoma   Abdominal  pain, other specified site    Hypokalemia   Hyponatremia   Antineoplastic chemotherapy induced anemia   Thrombocytopenia  Discharge Condition: stable  Filed Weights   11/20/13 2211  Weight: 50.9 kg (112 lb 3.4 oz)    History of present illness:  58 year old female with liver cancer, undergoing active chemotherapy under the care of Dr. Arbutus Ped. She presents to [facility] long emergency department with main concern of progressively worsening epigastric pain that started several days prior to admission, throbbing, 5/10 in severity, no specific radiating symptoms, no specific alleviating or aggravating factors, associated with nausea and nonbloody vomiting. She denies similar events in the past. She explains this has also been associated with subjective fevers and chills, no chest pain or shortness of breath, no specific urinary concerns.  In the emergency department patient found to be febrile with temperature 101.5, slightly tachycardic with heart rate of 105.bpm. Chest x-ray and abdominal x-ray unremarkable for acute findings. [facility] hospitalist asked to admit for further evaluation. Please note that patient has been scheduled for CT scan of the abdomen and pelvis 11/21/2013  Hospital Course:  Admitted to floor. CT showed tumor, but nothing else acute.  No further fevers, but endorsed cough. Flu swab negative. Started on antibiotics for acute bronchitis.  By discharge, feeling better, tolerating diet and requesting discharge. Discussed with Dr. Shirline Frees. He will discuss CT results with pt at their  next visit  Procedures:  none  Consultations:  none  Discharge Exam: Filed Vitals:   11/21/13 1406  BP: 130/102  Pulse: 96  Temp: 97.4 F (36.3 C)  Resp: 18    General: smiling. Occasional cough. comfortable Cardiovascular: RRR Respiratory: CTA Abd: S, NT, nD  Discharge Instructions  Discharge Orders   Future Appointments Provider Department Dept Phone   11/26/2013 9:15 AM [facility] [phone]   11/26/2013 9:45 AM Si Gaul, MD [facility] [phone]   11/28/2013 9:30 AM [facility] [place] [facility] [phone]   12/05/2013 9:45 AM [facility] [place] [facility] [phone]   12/05/2013 10:15 AM [facility] [phone]   Future Orders Complete By Expires   Diet general  As directed        Medication List         azithromycin 250 MG tablet  Commonly known as:  ZITHROMAX  Take 1 tablet (250 mg total) by mouth daily.  Start taking on:  11/22/2013     ibuprofen 200 MG tablet  Commonly known as:  ADVIL,MOTRIN  Take 400 mg by mouth every 6 (six) hours as needed for fever or moderate pain.     lidocaine-prilocaine cream  Commonly known as:  EMLA  Apply topically as needed. Apply to port 1 hr before chemo     LORazepam 0.5 MG tablet  Commonly known as:  ATIVAN  Take 1 tablet (0.5 mg total) by mouth every 8 (eight) hours as needed for anxiety.     oxycodone 5 MG capsule  Commonly known as:  OXY-IR  Take 1 capsule (5 mg total) by mouth every 4 (four) hours as needed.     prochlorperazine 10 MG tablet  Commonly known as:  COMPAZINE  Take 1 tablet (10 mg total) by mouth every 6 (six) hours as needed for nausea or vomiting.     promethazine 25 MG tablet  Commonly known as:  PHENERGAN  Take 1 tablet (25 mg total) by mouth every 8 (eight) hours as needed for nausea or  vomiting.       Allergies  Allergen Reactions  . Tramadol Nausea And Vomiting    headache      The results of significant diagnostics from this hospitalization (including imaging, microbiology, ancillary and laboratory) are listed below for reference.    Significant Diagnostic Studies: Dg Chest 2 View  11/20/2013   CLINICAL DATA:  Nausea and vomiting.  Chest pain.  Chemotherapy.  EXAM: CHEST  2 VIEW  COMPARISON:  01/20/2007  FINDINGS: Artifact overlies the chest. Power port on the right has its tip in the SVC above the right atrium. Heart size is normal. Mediastinal shadows are normal. The lungs are clear. No effusions. No bony abnormalities.  IMPRESSION: No active disease.   Electronically Signed   By: Paulina Fusi M.D.   On: 11/20/2013 14:10   Ct Chest W Contrast  11/21/2013   CLINICAL DATA:  Cholangiocarcinoma undergoing chemotherapy. Upper abdominal pain.  EXAM: CT CHEST, ABDOMEN, AND PELVIS WITH CONTRAST  TECHNIQUE: Multidetector CT imaging of the chest, abdomen and pelvis was performed following the standard protocol during bolus administration of intravenous contrast.  CONTRAST:  80mL OMNIPAQUE IOHEXOL 300 MG/ML  SOLN  COMPARISON:  CT 09/19/2013  FINDINGS: CT CHEST FINDINGS  The chest wall is unremarkable and stable. A right-sided power port is noted. No breast masses, supraclavicular or axillary lymphadenopathy. The thyroid gland appears normal. The bony thorax is intact. No destructive bone lesions or spinal canal compromise. Stable small sclerotic lesion noted in the T7 vertebral body.  The heart is normal in size. No pericardial effusion. A small amount of pericardial fluid is noted in the recesses. No mediastinal or hilar mass or lymphadenopathy. There are few Stable scattered mediastinal lymph nodes. The aorta is normal in caliber. No dissection. The branch vessels are patent. The esophagus is grossly normal.  Examination of the lung parenchyma demonstrates no acute pulmonary  findings. No metastatic pulmonary nodules. No pleural effusion.  CT ABDOMEN AND PELVIS FINDINGS  Large complex necrotic liver lesion has enlarged since the prior CT scan. It previously measured a maximum of 8.7 x 6.0 cm and now measures 10.0 x 7.2 cm. There appears to be a slightly larger subcapsular fluid collection or necrotic tumor. Small metastatic lesions have also slightly enlarged. The 10 mm lesion in the right hepatic lobe near the dome is stable. But the ill-defined lesion just inferiorly has enlarged. It was difficult to measure on the prior study but now measures 22 mm. No common bile duct dilatation. The pancreas is unremarkable. The spleen is upper limits of normal in size. No focal lesion. The adrenal glands and kidneys are unremarkable and stable. The gallbladder is contracted.  The stomach is not distended with contrast. No gross abnormalities. The duodenum, small bowel and colon are unremarkable. No inflammatory changes, mass lesions or obstructive findings.  There are enlarged gastrohepatic ligament, periportal, celiac axis and retroperitoneal lymph nodes. These are stable. An index node on image number 63 measures 15 mm and is unchanged. A 2nd index node in the  left retroperitoneum on image number 69 measures a maximum of 13 mm and is stable. The aorta and branch vessels are patent and unchanged.  The the uterus is surgically absent. The bladder appears normal. There is a small amount of free pelvic fluid also noted on the prior study. No pelvic mass or lymphadenopathy. No inguinal mass or adenopathy.  The bony structures are intact. Osteopenic changes are noted. The portal and splenic veins are patent.  IMPRESSION: 1. No CT findings for metastatic disease involving the chest. 2. Enlarging complex necrotic liver mass which involves the liver capsule and could be a cause for pain. Small metastatic lesions have also enlarged since the prior study. 3. Stable upper abdominal lymphadenopathy. 4. Small  amount of free pelvic fluid.   Electronically Signed   By: Loralie Champagne M.D.   On: 11/21/2013 10:17   Ct Abdomen Pelvis W Contrast  11/21/2013   CLINICAL DATA:  Cholangiocarcinoma undergoing chemotherapy. Upper abdominal pain.  EXAM: CT CHEST, ABDOMEN, AND PELVIS WITH CONTRAST  TECHNIQUE: Multidetector CT imaging of the chest, abdomen and pelvis was performed following the standard protocol during bolus administration of intravenous contrast.  CONTRAST:  80mL OMNIPAQUE IOHEXOL 300 MG/ML  SOLN  COMPARISON:  CT 09/19/2013  FINDINGS: CT CHEST FINDINGS  The chest wall is unremarkable and stable. A right-sided power port is noted. No breast masses, supraclavicular or axillary lymphadenopathy. The thyroid gland appears normal. The bony thorax is intact. No destructive bone lesions or spinal canal compromise. Stable small sclerotic lesion noted in the T7 vertebral body.  The heart is normal in size. No pericardial effusion. A small amount of pericardial fluid is noted in the recesses. No mediastinal or hilar mass or lymphadenopathy. There are few Stable scattered mediastinal lymph nodes. The aorta is normal in caliber. No dissection. The branch vessels are patent. The esophagus is grossly normal.  Examination of the lung parenchyma demonstrates no acute pulmonary findings. No metastatic pulmonary nodules. No pleural effusion.  CT ABDOMEN AND PELVIS FINDINGS  Large complex necrotic liver lesion has enlarged since the prior CT scan. It previously measured a maximum of 8.7 x 6.0 cm and now measures 10.0 x 7.2 cm. There appears to be a slightly larger subcapsular fluid collection or necrotic tumor. Small metastatic lesions have also slightly enlarged. The 10 mm lesion in the right hepatic lobe near the dome is stable. But the ill-defined lesion just inferiorly has enlarged. It was difficult to measure on the prior study but now measures 22 mm. No common bile duct dilatation. The pancreas is unremarkable. The spleen is  upper limits of normal in size. No focal lesion. The adrenal glands and kidneys are unremarkable and stable. The gallbladder is contracted.  The stomach is not distended with contrast. No gross abnormalities. The duodenum, small bowel and colon are unremarkable. No inflammatory changes, mass lesions or obstructive findings.  There are enlarged gastrohepatic ligament, periportal, celiac axis and retroperitoneal lymph nodes. These are stable. An index node on image number 63 measures 15 mm and is unchanged. A 2nd index node in the left retroperitoneum on image number 69 measures a maximum of 13 mm and is stable. The aorta and branch vessels are patent and unchanged.  The the uterus is surgically absent. The bladder appears normal. There is a small amount of free pelvic fluid also noted on the prior study. No pelvic mass or lymphadenopathy. No inguinal mass or adenopathy.  The bony structures are intact. Osteopenic changes are noted. The portal  and splenic veins are patent.  IMPRESSION: 1. No CT findings for metastatic disease involving the chest. 2. Enlarging complex necrotic liver mass which involves the liver capsule and could be a cause for pain. Small metastatic lesions have also enlarged since the prior study. 3. Stable upper abdominal lymphadenopathy. 4. Small amount of free pelvic fluid.   Electronically Signed   By: Loralie Champagne M.D.   On: 11/21/2013 10:17   Dg Abd 2 Views  11/20/2013   CLINICAL DATA:  Nausea and vomiting.  EXAM: ABDOMEN - 2 VIEW  COMPARISON:  CT 09/19/2013  FINDINGS: Bowel gas pattern is normal without evidence of ileus, obstruction or free air. No abnormal calcifications or bony findings.  IMPRESSION: Negative abdominal radiographs.   Electronically Signed   By: Paulina Fusi M.D.   On: 11/20/2013 14:11    Microbiology: Recent Results (from the past 240 hour(s))  CULTURE, BLOOD (ROUTINE X 2)     Status: None   Collection Time    11/20/13  1:00 PM      Result Value Range Status    Specimen Description BLOOD LEFT ANTECUBITAL   Final   Special Requests BOTTLES DRAWN AEROBIC AND ANAEROBIC   Final   Culture  Setup Time     Final   Value: 11/20/2013 15:57     Performed at [facility]   Culture     Final   Value:        BLOOD CULTURE RECEIVED NO GROWTH TO DATE CULTURE WILL BE HELD FOR 5 DAYS BEFORE ISSUING A FINAL NEGATIVE REPORT     Performed at [facility]   Report Status PENDING   Incomplete  CULTURE, BLOOD (ROUTINE X 2)     Status: None   Collection Time    11/20/13  1:25 PM      Result Value Range Status   Specimen Description BLOOD RIGHT HAND   Final   Special Requests BOTTLES DRAWN AEROBIC ONLY 15M   Final   Culture  Setup Time     Final   Value: 11/20/2013 15:57     Performed at [facility]   Culture     Final   Value:        BLOOD CULTURE RECEIVED NO GROWTH TO DATE CULTURE WILL BE HELD FOR 5 DAYS BEFORE ISSUING A FINAL NEGATIVE REPORT     Performed at [facility]   Report Status PENDING   Incomplete     Labs: Basic Metabolic Panel:  Recent Labs Lab 11/20/13 1259 11/21/13 0748  NA 124* 126*  K 3.9 3.0*  CL 83* 87*  CO2 26 25  GLUCOSE 154* 94  BUN 18 9  CREATININE 0.65 0.53  CALCIUM 9.3 8.3*   Liver Function Tests:  Recent Labs Lab 11/20/13 1259  AST 33  ALT 23  ALKPHOS 211*  BILITOT 0.4  PROT 7.2  ALBUMIN 3.6    Recent Labs Lab 11/20/13 1259  LIPASE 21   No results found for this basename: AMMONIA,  in the last 168 hours CBC:  Recent Labs Lab 11/20/13 1259 11/21/13 0748  WBC 4.9 4.0  NEUTROABS 4.2  --   HGB 11.1* 9.6*  HCT 31.1* 27.1*  MCV 88.4 89.1  PLT 54* 31*   Cardiac Enzymes: No results found for this basename: CKTOTAL, CKMB, CKMBINDEX, TROPONINI,  in the last 168 hours BNP: BNP (last 3 results) No results found for this basename: PROBNP,  in the last 8760 hours CBG:  No results found for this basename: GLUCAP,  in the last 168  hours     Signed:  Joceline Hinchcliff L  [facility] 11/21/2013, 3:02 PM

## 2013-11-21 NOTE — Progress Notes (Signed)
Pt has temp of 101.8. No tylenol ordered. Will contact MD on call

## 2013-11-21 NOTE — Progress Notes (Signed)
Nutrition Brief Note  Patient identified on the Malnutrition Screening Tool (MST) Report. Pt with stable weight and is eating 100% of meals.  Wt Readings from Last 15 Encounters:  11/20/13 112 lb 3.4 oz (50.9 kg)  11/06/13 112 lb 12.8 oz (51.166 kg)  10/24/13 108 lb 4.8 oz (49.125 kg)  10/17/13 109 lb 6.4 oz (49.624 kg)  09/26/13 112 lb 6.4 oz (50.984 kg)  09/05/13 112 lb 9.6 oz (51.075 kg)  08/15/13 111 lb 12.8 oz (50.712 kg)  07/25/13 112 lb 14.4 oz (51.211 kg)  07/04/13 112 lb 1.6 oz (50.848 kg)  06/13/13 115 lb 11.2 oz (52.481 kg)  05/30/13 116 lb 8 oz (52.844 kg)  05/24/13 117 lb (53.071 kg)  05/17/13 117 lb 11.2 oz (53.388 kg)  05/10/13 116 lb (52.617 kg)  02/20/13 126 lb 3.2 oz (57.244 kg)    Body mass index is 21.92 kg/(m^2). Patient meets criteria for normal weight based on current BMI.   Current diet order is Regular, patient is consuming approximately 100% of meals at this time. Labs and medications reviewed.   No nutrition interventions warranted at this time. If nutrition issues arise, please consult RD.   Jarold Motto MS, RD, LDN Pager: [phone] After-hours pager: [phone]

## 2013-11-26 ENCOUNTER — Encounter: Payer: Self-pay | Admitting: Internal Medicine

## 2013-11-26 ENCOUNTER — Other Ambulatory Visit (HOSPITAL_BASED_OUTPATIENT_CLINIC_OR_DEPARTMENT_OTHER): Payer: BC Managed Care – PPO

## 2013-11-26 ENCOUNTER — Ambulatory Visit (HOSPITAL_BASED_OUTPATIENT_CLINIC_OR_DEPARTMENT_OTHER): Payer: BC Managed Care – PPO | Admitting: Internal Medicine

## 2013-11-26 VITALS — BP 151/95 | HR 113 | Temp 97.4°F | Resp 18 | Ht 60.0 in | Wt 108.2 lb

## 2013-11-26 DIAGNOSIS — C221 Intrahepatic bile duct carcinoma: Secondary | ICD-10-CM

## 2013-11-26 LAB — CBC WITH DIFFERENTIAL/PLATELET
BASO%: 0.2 % (ref 0.0–2.0)
BASOS ABS: 0 10*3/uL (ref 0.0–0.1)
EOS%: 0.4 % (ref 0.0–7.0)
Eosinophils Absolute: 0 10*3/uL (ref 0.0–0.5)
HEMATOCRIT: 30.9 % — AB (ref 34.8–46.6)
HEMOGLOBIN: 10.6 g/dL — AB (ref 11.6–15.9)
LYMPH#: 0.5 10*3/uL — AB (ref 0.9–3.3)
LYMPH%: 9.9 % — ABNORMAL LOW (ref 14.0–49.7)
MCH: 30.9 pg (ref 25.1–34.0)
MCHC: 34.3 g/dL (ref 31.5–36.0)
MCV: 90.1 fL (ref 79.5–101.0)
MONO#: 1.1 10*3/uL — ABNORMAL HIGH (ref 0.1–0.9)
MONO%: 21.2 % — AB (ref 0.0–14.0)
NEUT#: 3.5 10*3/uL (ref 1.5–6.5)
NEUT%: 68.3 % (ref 38.4–76.8)
Platelets: 120 10*3/uL — ABNORMAL LOW (ref 145–400)
RBC: 3.43 10*6/uL — ABNORMAL LOW (ref 3.70–5.45)
RDW: 14.1 % (ref 11.2–14.5)
WBC: 5.1 10*3/uL (ref 3.9–10.3)
nRBC: 0 % (ref 0–0)

## 2013-11-26 LAB — CULTURE, BLOOD (ROUTINE X 2)
Culture: NO GROWTH
Culture: NO GROWTH

## 2013-11-26 NOTE — Patient Instructions (Signed)
Followup visit in 2 weeks. 

## 2013-11-26 NOTE — Progress Notes (Signed)
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 First cycle on 05/23/2013. Status post 9 cycles. Cisplatin was reduced to 60 mg/M2 starting from cycle #8.   CURRENT THERAPY: None.  INTERVAL HISTORY: Tammy [name] 58 y.o. female returns to the clinic today for follow up visit accompanied by her husband and the Spanish interpreter. The patient was recently admitted to [facility] with worsening epigastric pain started a few days before the admission. She also had fever and chills at that time. Her temperature at the emergency Department was 101.5.  Chest x-ray as well as abdominal x-ray were unremarkable for any acute findings. CT scan of the abdomen performed during her admission showed evidence for disease progression especially in the liver lesions. The patient was treated during her admission with Zithromax and Ancef for 4 days. She is feeling much better today. She denied having any significant fever or chills. The patient denied having any significant weight loss or night sweats. She has no chest pain, shortness of breath, cough or hemoptysis. She tolerated the last cycle of her chemotherapy fairly well except for the few episodes of nausea. She is here today for evaluation and discussion of her treatment options.  MEDICAL HISTORY: Past Medical History  Diagnosis Date  . Arthritis   . Polyp of colon 11/14/2008    seen on colonoscopy, f/u colonoscopy recommended in 5 years.   . 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  MEDICATIONS:  Current Outpatient Prescriptions  Medication Sig Dispense Refill  . ibuprofen (ADVIL,MOTRIN) 200 MG  tablet Take 400 mg by mouth every 6 (six) hours as needed for fever or moderate pain.      Marland Kitchen lidocaine-prilocaine (EMLA) cream Apply topically as needed. Apply to port 1 hr before chemo  30 g  0  . LORazepam (ATIVAN) 0.5 MG tablet Take 1 tablet (0.5 mg total) by mouth every 8 (eight) hours as needed for anxiety.  30 tablet  0  . oxycodone (OXY-IR) 5 MG capsule Take 1 capsule (5 mg total) by mouth every 4 (four) hours as needed.  30 capsule  0  . prochlorperazine (COMPAZINE) 10 MG tablet Take 1 tablet (10 mg total) by mouth every 6 (six) hours as needed for nausea or vomiting.  30 tablet  0  . promethazine (PHENERGAN) 25 MG tablet Take 1 tablet (25 mg total) by mouth every 8 (eight) hours as needed for nausea or vomiting.  30 tablet  0   No current facility-administered medications for this visit.    SURGICAL HISTORY:  Past Surgical History  Procedure Laterality Date  . Abdominal hysterectomy  01/24/2007    including cervix, benign    REVIEW OF SYSTEMS:  Constitutional: positive for fatigue Eyes: negative Ears, nose, mouth, throat, and face: negative Respiratory: negative Cardiovascular: negative Gastrointestinal: positive for nausea Genitourinary:negative Integument/breast: negative Hematologic/lymphatic: negative Musculoskeletal:negative Neurological: negative Behavioral/Psych: positive for depression Endocrine: negative Allergic/Immunologic: negative   PHYSICAL EXAMINATION: General appearance: alert, cooperative, fatigued and no distress Head: Normocephalic, without obvious abnormality, atraumatic Neck: no adenopathy, no JVD, supple, symmetrical, trachea midline and thyroid not enlarged, symmetric, no tenderness/mass/nodules Lymph nodes: Cervical, supraclavicular, and  axillary nodes normal. Resp: clear to auscultation bilaterally Back: symmetric, no curvature. ROM normal. No CVA tenderness. Cardio: regular rate and rhythm, S1, S2 normal, no murmur, click, rub or gallop GI:  soft, non-tender; bowel sounds normal; no masses,  no organomegaly Extremities: extremities normal, atraumatic, no cyanosis or edema Neurologic: Alert and oriented X 3, normal strength and tone. Normal symmetric reflexes. Normal coordination and gait  ECOG PERFORMANCE STATUS: 1 - Symptomatic but completely ambulatory  Blood pressure 151/95, pulse 113, temperature 97.4 F (36.3 C), temperature source Oral, resp. rate 18, height 5' (1.524 m), weight 108 lb 3.2 oz (49.079 kg), SpO2 100.00%.  LABORATORY DATA: Lab Results  Component Value Date   WBC 5.1 11/26/2013   HGB 10.6* 11/26/2013   HCT 30.9* 11/26/2013   MCV 90.1 11/26/2013   PLT 120* 11/26/2013      Chemistry      Component Value Date/Time   NA 126* 11/21/2013 0748   NA 132* 11/14/2013 0828   K 3.0* 11/21/2013 0748   K 4.3 11/14/2013 0828   CL 87* 11/21/2013 0748   CL 95* 05/10/2013 1249   CO2 25 11/21/2013 0748   CO2 26 11/14/2013 0828   BUN 9 11/21/2013 0748   BUN 21.6 11/14/2013 0828   CREATININE 0.53 11/21/2013 0748   CREATININE 0.8 11/14/2013 0828   CREATININE 0.39* 02/20/2013 1640      Component Value Date/Time   CALCIUM 8.3* 11/21/2013 0748   CALCIUM 9.7 11/14/2013 0828   ALKPHOS 211* 11/20/2013 1259   ALKPHOS 298* 11/14/2013 0828   AST 33 11/20/2013 1259   AST 36* 11/14/2013 0828   ALT 23 11/20/2013 1259   ALT 41 11/14/2013 0828   BILITOT 0.4 11/20/2013 1259   BILITOT 0.53 11/14/2013 0828       RADIOGRAPHIC STUDIES:  ASSESSMENT AND PLAN: This is a very pleasant 58 years old Hispanic female with metastatic cholangiocarcinoma currently on treatment with cisplatin and gemcitabine status post 9 cycles.  Her recent scan showed evidence for disease progression especially in the mean liver lesions.  I discussed the scan results and showed the images to the patient and her husband. I gave the patient several options for treatment of her condition including systemic chemotherapy with single agent docetaxel 75 mg/M2 every  3 weeks versus treatment with oral Xeloda. I will give the patient a week of treatment to recover from the recent hospital admission and adverse effect of the previous chemotherapy before consideration of starting second line treatment. I also discussed her condition with some of my partners to see if they have any other recommendations. The patient would come back for followup visit in 2 weeks for reevaluation and more detailed discussion of her treatment options. She was advised to call immediately if she has any concerning symptoms in the interval. The patient voices understanding of current disease status and treatment options and is in agreement with the current care plan.  All questions were answered. The patient knows to call the clinic with any problems, questions or concerns. We can certainly see the patient much sooner if necessary.  I spent 15 minutes counseling the patient face to face. The total time spent in the appointment was 25 minutes.

## 2013-11-27 ENCOUNTER — Telehealth: Payer: Self-pay | Admitting: Internal Medicine

## 2013-11-27 NOTE — Telephone Encounter (Signed)
lvm for pt regarding to Jan 2015 appt and cx appts.

## 2013-11-28 ENCOUNTER — Ambulatory Visit: Payer: Self-pay

## 2013-11-29 ENCOUNTER — Telehealth: Payer: Self-pay | Admitting: *Deleted

## 2013-11-29 NOTE — Telephone Encounter (Signed)
Pt's friend called in a hospice referral.  Per Dr Vista Mink, pt can either have hospice now or wait until he sees her on 12/13/13 to discuss further.  Called and spoke to pt's husband with an interpreter, pt would like to wait and see Dr Vista Mink for f/u 12/13/13.  Called and informed Evette at hospice.  SLJ

## 2013-12-05 ENCOUNTER — Ambulatory Visit: Payer: Self-pay

## 2013-12-05 ENCOUNTER — Other Ambulatory Visit: Payer: Self-pay | Admitting: Internal Medicine

## 2013-12-05 ENCOUNTER — Other Ambulatory Visit: Payer: Self-pay

## 2013-12-13 ENCOUNTER — Encounter: Payer: Self-pay | Admitting: Internal Medicine

## 2013-12-13 ENCOUNTER — Telehealth: Payer: Self-pay | Admitting: Internal Medicine

## 2013-12-13 ENCOUNTER — Ambulatory Visit (HOSPITAL_BASED_OUTPATIENT_CLINIC_OR_DEPARTMENT_OTHER): Payer: BC Managed Care – PPO | Admitting: Internal Medicine

## 2013-12-13 ENCOUNTER — Other Ambulatory Visit (HOSPITAL_BASED_OUTPATIENT_CLINIC_OR_DEPARTMENT_OTHER): Payer: BC Managed Care – PPO

## 2013-12-13 ENCOUNTER — Telehealth: Payer: Self-pay | Admitting: Medical Oncology

## 2013-12-13 VITALS — BP 157/91 | HR 106 | Temp 97.5°F | Resp 18 | Ht 60.0 in | Wt 107.8 lb

## 2013-12-13 DIAGNOSIS — R5383 Other fatigue: Secondary | ICD-10-CM

## 2013-12-13 DIAGNOSIS — C787 Secondary malignant neoplasm of liver and intrahepatic bile duct: Secondary | ICD-10-CM

## 2013-12-13 DIAGNOSIS — C221 Intrahepatic bile duct carcinoma: Secondary | ICD-10-CM

## 2013-12-13 DIAGNOSIS — R5381 Other malaise: Secondary | ICD-10-CM

## 2013-12-13 LAB — CBC WITH DIFFERENTIAL/PLATELET
BASO%: 0.1 % (ref 0.0–2.0)
Basophils Absolute: 0 10*3/uL (ref 0.0–0.1)
EOS ABS: 0 10*3/uL (ref 0.0–0.5)
EOS%: 0.4 % (ref 0.0–7.0)
HCT: 31.5 % — ABNORMAL LOW (ref 34.8–46.6)
HGB: 10.5 g/dL — ABNORMAL LOW (ref 11.6–15.9)
LYMPH%: 9.8 % — AB (ref 14.0–49.7)
MCH: 31.1 pg (ref 25.1–34.0)
MCHC: 33.3 g/dL (ref 31.5–36.0)
MCV: 93.2 fL (ref 79.5–101.0)
MONO#: 0.7 10*3/uL (ref 0.1–0.9)
MONO%: 9.6 % (ref 0.0–14.0)
NEUT%: 80.1 % — AB (ref 38.4–76.8)
NEUTROS ABS: 5.8 10*3/uL (ref 1.5–6.5)
PLATELETS: 203 10*3/uL (ref 145–400)
RBC: 3.38 10*6/uL — ABNORMAL LOW (ref 3.70–5.45)
RDW: 14.7 % — ABNORMAL HIGH (ref 11.2–14.5)
WBC: 7.2 10*3/uL (ref 3.9–10.3)
lymph#: 0.7 10*3/uL — ABNORMAL LOW (ref 0.9–3.3)

## 2013-12-13 LAB — COMPREHENSIVE METABOLIC PANEL (CC13)
ALK PHOS: 268 U/L — AB (ref 40–150)
ALT: 30 U/L (ref 0–55)
ANION GAP: 9 meq/L (ref 3–11)
AST: 43 U/L — ABNORMAL HIGH (ref 5–34)
Albumin: 3.9 g/dL (ref 3.5–5.0)
BILIRUBIN TOTAL: 0.51 mg/dL (ref 0.20–1.20)
BUN: 20.6 mg/dL (ref 7.0–26.0)
CO2: 27 mEq/L (ref 22–29)
CREATININE: 0.8 mg/dL (ref 0.6–1.1)
Calcium: 9.8 mg/dL (ref 8.4–10.4)
Chloride: 95 mEq/L — ABNORMAL LOW (ref 98–109)
GLUCOSE: 196 mg/dL — AB (ref 70–140)
Potassium: 4.7 mEq/L (ref 3.5–5.1)
SODIUM: 131 meq/L — AB (ref 136–145)
Total Protein: 7.5 g/dL (ref 6.4–8.3)

## 2013-12-13 MED ORDER — DEXAMETHASONE 4 MG PO TABS: ORAL_TABLET | ORAL | Status: DC

## 2013-12-13 NOTE — Telephone Encounter (Signed)
gv and printed aptp sched and avs for pt for Jan and Feb....sed added tx.

## 2013-12-13 NOTE — Telephone Encounter (Signed)
Dewaine Oats called to f/u hospice referral from pt friend. I told Dewaine Oats to disregard Hospice referral and  that pt will be starting chemotherapy  .

## 2013-12-13 NOTE — Progress Notes (Signed)
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 First cycle on 05/23/2013. Status post 9 cycles. Cisplatin was reduced to 60 mg/M2 starting from cycle #8.   CURRENT THERAPY: Docetaxel 75 mg/M2 every 3 weeks with Neulasta support. First cycle 12/19/2048  INTERVAL HISTORY: Tammy [name] 58 y.o. female returns to the clinic today for follow up visit accompanied by her husband and the Spanish interpreter. The patient is feeling fine today with no specific complaints except for fatigue and was very emotional today and tearful at times. She has been off treatment for the last few weeks to begin more energy. She is here for evaluation and discussion of her treatment options. Last CT scan of the abdomen performed during her admission showed evidence for disease progression especially in the liver lesions. She is feeling much better today. She denied having any significant fever or chills. The patient denied having any significant weight loss or night sweats. She has no chest pain, shortness of breath, cough or hemoptysis.   MEDICAL HISTORY: Past Medical History  Diagnosis Date  . Arthritis   . Polyp of colon 11/14/2008    seen on colonoscopy, f/u colonoscopy recommended in 5 years.   . 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  MEDICATIONS:  Current Outpatient Prescriptions  Medication Sig Dispense Refill  . lidocaine-prilocaine (EMLA) cream Apply topically as needed. Apply to port 1 hr before chemo  30 g  0  . ibuprofen (ADVIL,MOTRIN) 200 MG tablet Take 400 mg by mouth every 6 (six) hours as needed for fever or moderate pain.      Marland Kitchen LORazepam  (ATIVAN) 0.5 MG tablet Take 1 tablet (0.5 mg total) by mouth every 8 (eight) hours as needed for anxiety.  30 tablet  0  . oxycodone (OXY-IR) 5 MG capsule Take 1 capsule (5 mg total) by mouth every 4 (four) hours as needed.  30 capsule  0  . prochlorperazine (COMPAZINE) 10 MG tablet Take 1 tablet (10 mg total) by mouth every 6 (six) hours as needed for nausea or vomiting.  30 tablet  0  . promethazine (PHENERGAN) 25 MG tablet Take 1 tablet (25 mg total) by mouth every 8 (eight) hours as needed for nausea or vomiting.  30 tablet  0   No current facility-administered medications for this visit.    SURGICAL HISTORY:  Past Surgical History  Procedure Laterality Date  . Abdominal hysterectomy  01/24/2007    including cervix, benign    REVIEW OF SYSTEMS:  Constitutional: positive for fatigue Eyes: negative Ears, nose, mouth, throat, and face: negative Respiratory: negative Cardiovascular: negative Gastrointestinal: positive for nausea Genitourinary:negative Integument/breast: negative Hematologic/lymphatic: negative Musculoskeletal:negative Neurological: negative Behavioral/Psych: positive for depression Endocrine: negative Allergic/Immunologic: negative   PHYSICAL EXAMINATION: General appearance: alert, cooperative, fatigued and no distress Head: Normocephalic, without obvious abnormality, atraumatic Neck: no adenopathy, no JVD, supple, symmetrical, trachea midline and thyroid not enlarged, symmetric, no tenderness/mass/nodules Lymph nodes: Cervical, supraclavicular, and axillary nodes normal. Resp: clear to auscultation bilaterally Back: symmetric, no curvature. ROM normal. No CVA tenderness. Cardio: regular rate and rhythm, S1, S2 normal, no murmur, click, rub or gallop GI: soft, non-tender; bowel  sounds normal; no masses,  no organomegaly Extremities: extremities normal, atraumatic, no cyanosis or edema Neurologic: Alert and oriented X 3, normal strength and tone. Normal symmetric  reflexes. Normal coordination and gait  ECOG PERFORMANCE STATUS: 1 - Symptomatic but completely ambulatory  Blood pressure 157/91, pulse 106, temperature 97.5 F (36.4 C), temperature source Oral, resp. rate 18, height 5' (1.524 m), weight 107 lb 12.8 oz (48.898 kg), SpO2 100.00%.  LABORATORY DATA: Lab Results  Component Value Date   WBC 7.2 12/13/2013   HGB 10.5* 12/13/2013   HCT 31.5* 12/13/2013   MCV 93.2 12/13/2013   PLT 203 12/13/2013      Chemistry      Component Value Date/Time   NA 126* 11/21/2013 0748   NA 132* 11/14/2013 0828   K 3.0* 11/21/2013 0748   K 4.3 11/14/2013 0828   CL 87* 11/21/2013 0748   CL 95* 05/10/2013 1249   CO2 25 11/21/2013 0748   CO2 26 11/14/2013 0828   BUN 9 11/21/2013 0748   BUN 21.6 11/14/2013 0828   CREATININE 0.53 11/21/2013 0748   CREATININE 0.8 11/14/2013 0828   CREATININE 0.39* 02/20/2013 1640      Component Value Date/Time   CALCIUM 8.3* 11/21/2013 0748   CALCIUM 9.7 11/14/2013 0828   ALKPHOS 211* 11/20/2013 1259   ALKPHOS 298* 11/14/2013 0828   AST 33 11/20/2013 1259   AST 36* 11/14/2013 0828   ALT 23 11/20/2013 1259   ALT 41 11/14/2013 0828   BILITOT 0.4 11/20/2013 1259   BILITOT 0.53 11/14/2013 0828       RADIOGRAPHIC STUDIES: Dg Chest 2 View  11/20/2013   CLINICAL DATA:  Nausea and vomiting.  Chest pain.  Chemotherapy.  EXAM: CHEST  2 VIEW  COMPARISON:  01/20/2007  FINDINGS: Artifact overlies the chest. Power port on the right has its tip in the SVC above the right atrium. Heart size is normal. Mediastinal shadows are normal. The lungs are clear. No effusions. No bony abnormalities.  IMPRESSION: No active disease.   Electronically Signed   By: Nelson Chimes M.D.   On: 11/20/2013 14:10   Ct Chest W Contrast  11/21/2013   CLINICAL DATA:  Cholangiocarcinoma undergoing chemotherapy. Upper abdominal pain.  EXAM: CT CHEST, ABDOMEN, AND PELVIS WITH CONTRAST  TECHNIQUE: Multidetector CT imaging of the chest, abdomen and pelvis was  performed following the standard protocol during bolus administration of intravenous contrast.  CONTRAST:  50mL OMNIPAQUE IOHEXOL 300 MG/ML  SOLN  COMPARISON:  CT 09/19/2013  FINDINGS: CT CHEST FINDINGS  The chest wall is unremarkable and stable. A right-sided power port is noted. No breast masses, supraclavicular or axillary lymphadenopathy. The thyroid gland appears normal. The bony thorax is intact. No destructive bone lesions or spinal canal compromise. Stable small sclerotic lesion noted in the T7 vertebral body.  The heart is normal in size. No pericardial effusion. A small amount of pericardial fluid is noted in the recesses. No mediastinal or hilar mass or lymphadenopathy. There are few Stable scattered mediastinal lymph nodes. The aorta is normal in caliber. No dissection. The branch vessels are patent. The esophagus is grossly normal.  Examination of the lung parenchyma demonstrates no acute pulmonary findings. No metastatic pulmonary nodules. No pleural effusion.  CT ABDOMEN AND PELVIS FINDINGS  Large complex necrotic liver lesion has enlarged since the prior CT scan. It previously measured a maximum of 8.7 x 6.0 cm and now measures 10.0 x 7.2 cm. There appears to be a slightly larger subcapsular  fluid collection or necrotic tumor. Small metastatic lesions have also slightly enlarged. The 10 mm lesion in the right hepatic lobe near the dome is stable. But the ill-defined lesion just inferiorly has enlarged. It was difficult to measure on the prior study but now measures 22 mm. No common bile duct dilatation. The pancreas is unremarkable. The spleen is upper limits of normal in size. No focal lesion. The adrenal glands and kidneys are unremarkable and stable. The gallbladder is contracted.  The stomach is not distended with contrast. No gross abnormalities. The duodenum, small bowel and colon are unremarkable. No inflammatory changes, mass lesions or obstructive findings.  There are enlarged gastrohepatic  ligament, periportal, celiac axis and retroperitoneal lymph nodes. These are stable. An index node on image number 63 measures 15 mm and is unchanged. A 2nd index node in the left retroperitoneum on image number 69 measures a maximum of 13 mm and is stable. The aorta and branch vessels are patent and unchanged.  The the uterus is surgically absent. The bladder appears normal. There is a small amount of free pelvic fluid also noted on the prior study. No pelvic mass or lymphadenopathy. No inguinal mass or adenopathy.  The bony structures are intact. Osteopenic changes are noted. The portal and splenic veins are patent.  IMPRESSION: 1. No CT findings for metastatic disease involving the chest. 2. Enlarging complex necrotic liver mass which involves the liver capsule and could be a cause for pain. Small metastatic lesions have also enlarged since the prior study. 3. Stable upper abdominal lymphadenopathy. 4. Small amount of free pelvic fluid.   Electronically Signed   By: Kalman Jewels M.D.   On: 11/21/2013 10:17   ASSESSMENT AND PLAN: This is a very pleasant 58 years old Hispanic female with metastatic cholangiocarcinoma currently on treatment with cisplatin and gemcitabine status post 9 cycles.  Her recent scan showed evidence for disease progression especially in the mean liver lesions.  I again discussed the scan results and showed the images to the patient and her husband. I gave the patient several options for treatment of her condition including systemic chemotherapy with single agent docetaxel 75 mg/M2 every 3 weeks versus treatment with oral Xeloda. She was also given him the option of palliative care and hospice referral but she is not interested in considering this option at this point. She would like to proceed with systemic chemotherapy with docetaxel. I discussed with the patient adverse effect of the treatment including but not limited to alopecia, myelosuppression, nausea and vomiting, peripheral  neuropathy, liver or renal dysfunction. She is expected to start the first cycle of this treatment on 12/19/2013. The patient would come back for followup visit in 4 weeks for reevaluation before the start of cycle #2. The patient is interested in going back home to [place] and spend some time with her extended family there. She would consider doing this after she received few cycles of docetaxel. The patient and her husband were also wondering if there is any way to help her waive the requirements for the citizenship test because her green card is about to expire. I promised the patient that I would ask the social worker at the [facility] to see if there is any way to help her and I would be happy to provide her with any letters or documentations needed to support her application. She was advised to call immediately if she has any concerning symptoms in the interval. The patient voices understanding of current disease  status and treatment options and is in agreement with the current care plan.  All questions were answered. The patient knows to call the clinic with any problems, questions or concerns. We can certainly see the patient much sooner if necessary.  I spent 20 minutes counseling the patient face to face. The total time spent in the appointment was 30 minutes.

## 2013-12-17 ENCOUNTER — Encounter: Payer: Self-pay | Admitting: Internal Medicine

## 2013-12-17 NOTE — Progress Notes (Signed)
PER ROSEANNA, RN WHO AUTHORIZED THE NEW CHEMO HER CASE MANAGER WILL BE KATHY, RN  PHONE [phone] EXT [phone].

## 2013-12-19 ENCOUNTER — Ambulatory Visit (HOSPITAL_BASED_OUTPATIENT_CLINIC_OR_DEPARTMENT_OTHER): Payer: BC Managed Care – PPO

## 2013-12-19 VITALS — BP 168/69 | HR 58 | Temp 97.9°F | Resp 18 | Ht 60.0 in

## 2013-12-19 DIAGNOSIS — C221 Intrahepatic bile duct carcinoma: Secondary | ICD-10-CM

## 2013-12-19 DIAGNOSIS — Z5111 Encounter for antineoplastic chemotherapy: Secondary | ICD-10-CM

## 2013-12-19 LAB — CBC WITH DIFFERENTIAL/PLATELET
BASO%: 0.1 % (ref 0.0–2.0)
BASOS ABS: 0 10*3/uL (ref 0.0–0.1)
EOS ABS: 0 10*3/uL (ref 0.0–0.5)
EOS%: 0 % (ref 0.0–7.0)
HEMATOCRIT: 32.6 % — AB (ref 34.8–46.6)
HEMOGLOBIN: 11.3 g/dL — AB (ref 11.6–15.9)
LYMPH#: 0.8 10*3/uL — AB (ref 0.9–3.3)
LYMPH%: 5.9 % — AB (ref 14.0–49.7)
MCH: 31.9 pg (ref 25.1–34.0)
MCHC: 34.7 g/dL (ref 31.5–36.0)
MCV: 92.1 fL (ref 79.5–101.0)
MONO#: 0.6 10*3/uL (ref 0.1–0.9)
MONO%: 4.2 % (ref 0.0–14.0)
NEUT#: 12.4 10*3/uL — ABNORMAL HIGH (ref 1.5–6.5)
NEUT%: 89.8 % — ABNORMAL HIGH (ref 38.4–76.8)
PLATELETS: 211 10*3/uL (ref 145–400)
RBC: 3.54 10*6/uL — ABNORMAL LOW (ref 3.70–5.45)
RDW: 14.4 % (ref 11.2–14.5)
WBC: 13.9 10*3/uL — ABNORMAL HIGH (ref 3.9–10.3)

## 2013-12-19 LAB — COMPREHENSIVE METABOLIC PANEL (CC13)
ALT: 27 U/L (ref 0–55)
AST: 34 U/L (ref 5–34)
Albumin: 3.7 g/dL (ref 3.5–5.0)
Alkaline Phosphatase: 244 U/L — ABNORMAL HIGH (ref 40–150)
Anion Gap: 10 mEq/L (ref 3–11)
BILIRUBIN TOTAL: 0.36 mg/dL (ref 0.20–1.20)
BUN: 18.7 mg/dL (ref 7.0–26.0)
CHLORIDE: 94 meq/L — AB (ref 98–109)
CO2: 24 mEq/L (ref 22–29)
CREATININE: 0.8 mg/dL (ref 0.6–1.1)
Calcium: 9.6 mg/dL (ref 8.4–10.4)
GLUCOSE: 316 mg/dL — AB (ref 70–140)
Potassium: 4.3 mEq/L (ref 3.5–5.1)
Sodium: 128 mEq/L — ABNORMAL LOW (ref 136–145)
Total Protein: 7.1 g/dL (ref 6.4–8.3)

## 2013-12-19 MED ORDER — SODIUM CHLORIDE 0.9 % IV SOLN
Freq: Once | INTRAVENOUS | Status: AC
  Administered 2013-12-19: 11:00:00 via INTRAVENOUS

## 2013-12-19 MED ORDER — ONDANSETRON 8 MG/50ML IVPB (CHCC)
8.0000 mg | Freq: Once | INTRAVENOUS | Status: AC
  Administered 2013-12-19: 8 mg via INTRAVENOUS

## 2013-12-19 MED ORDER — SODIUM CHLORIDE 0.9 % IV SOLN
75.0000 mg/m2 | Freq: Once | INTRAVENOUS | Status: AC
  Administered 2013-12-19: 110 mg via INTRAVENOUS
  Filled 2013-12-19: qty 11

## 2013-12-19 MED ORDER — ONDANSETRON 8 MG/NS 50 ML IVPB
INTRAVENOUS | Status: AC
  Filled 2013-12-19: qty 8

## 2013-12-19 MED ORDER — HEPARIN SOD (PORK) LOCK FLUSH 100 UNIT/ML IV SOLN
500.0000 [IU] | Freq: Once | INTRAVENOUS | Status: AC | PRN
  Administered 2013-12-19: 500 [IU]
  Filled 2013-12-19: qty 5

## 2013-12-19 MED ORDER — DEXAMETHASONE SODIUM PHOSPHATE 10 MG/ML IJ SOLN
10.0000 mg | Freq: Once | INTRAMUSCULAR | Status: AC
  Administered 2013-12-19: 10 mg via INTRAVENOUS

## 2013-12-19 MED ORDER — DEXAMETHASONE SODIUM PHOSPHATE 10 MG/ML IJ SOLN
INTRAMUSCULAR | Status: AC
  Filled 2013-12-19: qty 1

## 2013-12-19 MED ORDER — SODIUM CHLORIDE 0.9 % IJ SOLN
10.0000 mL | INTRAMUSCULAR | Status: DC | PRN
  Administered 2013-12-19: 10 mL
  Filled 2013-12-19: qty 10

## 2013-12-19 NOTE — Patient Instructions (Addendum)
[facility] Discharge Instructions for Patients Receiving Chemotherapy  Today you received the following chemotherapy agents Taxotere.  To help prevent nausea and vomiting after your treatment, we encourage you to take your nausea medication as prescribed.     If you develop nausea and vomiting that is not controlled by your nausea medication, call the clinic.   BELOW ARE SYMPTOMS THAT SHOULD BE REPORTED IMMEDIATELY:  *FEVER GREATER THAN 100.5 F  *CHILLS WITH OR WITHOUT FEVER  NAUSEA AND VOMITING THAT IS NOT CONTROLLED WITH YOUR NAUSEA MEDICATION  *UNUSUAL SHORTNESS OF BREATH  *UNUSUAL BRUISING OR BLEEDING  TENDERNESS IN MOUTH AND THROAT WITH OR WITHOUT PRESENCE OF ULCERS  *URINARY PROBLEMS  *BOWEL PROBLEMS  UNUSUAL RASH Items with * indicate a potential emergency and should be followed up as soon as possible.  Feel free to call the clinic should you have any questions or concerns. The clinic phone number is [phone].  It was my pleasure to take care of you today!  Leeanne Rio, RN  Docetaxel injection Sander Nephew es este medicamento? El DOCETAXEL es un agente quimioteraputico. Este medicamento acta sobre las clulas que se dividen rpidamente, como las clulas cancergenas, y finalmente provoca la muerte de estas clulas. Se utiliza en el tratamiento de muchos tipos de cncer como el cncer de mama, adenocarcinoma gstrico, cabeza y cuello, pulmn y prstata. Este medicamento puede ser utilizado para otros usos; si tiene alguna pregunta consulte con su proveedor de atencin mdica o con su farmacutico. MARCAS COMERCIALES DISPONIBLES: Simona Huh , Taxotere Qu le debo informar a mi profesional de la salud antes de tomar este medicamento? Necesita saber si usted presenta alguno de los siguientes problemas o situaciones: -infeccin (especialmente infecciones virales, como varicela o herpes) -enfermedad heptica -conteos sanguneos bajos, como baja cantidad de  glbulos blancos, glbulos rojos y plaquetas -una reaccin alrgica o inusual al docetaxel, polisorbato 80, a otros agentes quimioteraputicos, a otros medicamentos, alimentos, colorantes o conservantes -si est embarazada o buscando quedar embarazada -si est amamantando a un beb Cmo debo utilizar este medicamento? Este medicamento se administra mediante infusin por va intravenosa. Lo administra un profesional de la salud calificado en un hospital o en un entorno clnico. Hable con su pediatra para informarse acerca del uso de este medicamento en nios. Puede requerir atencin especial. Sobredosis: Pngase en contacto inmediatamente con un centro toxicolgico o una sala de urgencia si usted cree que haya tomado demasiado medicamento. ATENCIN: [employer] es solo para usted. No comparta este medicamento con nadie. Qu sucede si me olvido de una dosis? Es importante no olvidar ninguna dosis. Informe a su mdico o a su profesional de la salud si no puede asistir a Photographer. Qu puede interactuar con este medicamento? -ciclosporina -eritromicina -quetoconazol -medicamentos para incrementar los conteos sanguneos, tales como filgrastim, pegfilgrastim, sargramostim -vacunas Consulte a su mdico o a su profesional de la salud antes de tomar cualquiera de los siguientes medicamentos: -acetaminofeno -aspirina -ibuprofeno -quetoprofeno -naproxeno Puede ser que esta lista no menciona todas las posibles interacciones. Informe a su profesional de [employer]	[place] de [employer] productos a base de hierbas, medicamentos de [place] o suplementos nutritivos que est tomando. Si usted fuma, consume bebidas alcohlicas o si utiliza drogas ilegales, indqueselo tambin a su profesional de [employer]	[place]. Algunas sustancias pueden interactuar con su medicamento. A qu debo estar atento al usar [employer]? Se supervisar su estado de salud atentamente mientras reciba este medicamento. Tendr que hacerse  anlisis de sangre peridicos mientras est  tomando [employer]. Este medicamento puede hacerle sentir un Nurse, mental health. Esto es normal ya que la quimioterapia afecta tanto a las clulas sanas como a las clulas cancerosas. Si presenta alguno de los [employer], infrmelos. Sin embargo, contine con el tratamiento aun si se siente enfermo, a menos que su mdico le indique que lo suspenda. En algunos casos, podr recibir [employer] para ayudarle con los efectos secundarios. Siga las instrucciones para usarlos. Consulte a su mdico o a su profesional de la salud por asesoramiento si tiene fiebre, escalofros, dolor de garganta o cualquier otro sntoma de resfro o gripe. No se trate usted mismo. Este medicamento puede reducir la capacidad del cuerpo para combatir infecciones. Trate de no acercarse a personas que estn enfermas. [employer] puede aumentar el riesgo de magulladuras o sangrado. Consulte a su mdico o a su profesional de la salud si observa sangrados inusuales. Proceda con cuidado al cepillar sus dientes, usar hilo dental o Risk manager palillos para los dientes, ya que puede contraer una infeccin o Therapist, art con mayor facilidad. Si se somete a algn tratamiento dental, informe a su dentista que est [employer]. Evite tomar productos que contienen aspirina, acetaminofeno, ibuprofeno, naproxeno o quetoprofeno a menos que as lo indique su mdico. Estos productos pueden disimular la fiebre. No se debe quedar embarazada mientras recibe este medicamento. Las mujeres deben informar a su mdico si estn buscando quedar embarazadas o si creen que estn embarazadas. Existe la posibilidad de efectos secundarios graves a un beb sin nacer. Para ms informacin hable con su profesional de la salud o su farmacutico. No debe Economist a un beb mientras est usando este medicamento. Qu efectos secundarios puedo tener al [employer] este medicamento? Efectos  secundarios que debe informar a su mdico o a Barrister's clerk de la salud tan pronto como sea posible: -reacciones alrgicas como erupcin cutnea, picazn o urticarias, hinchazn de la cara, labios o lengua -conteos sanguneos bajos - Este medicamento puede reducir la cantidad de glbulos blancos, glbulos rojos y plaquetas. Su riesgo de infeccin y [place]. -signos de infeccin - fiebre o escalofros, tos, dolor de garganta, Social research officer, government o dificultad para orinar -signos de reduccin de plaquetas o sangrado - magulladuras, puntos rojos en la piel, heces de color oscuro o con aspecto alquitranado, sangrando por la nariz -signos de reduccin de glbulos rojos - cansancio o debilidad inusual, desmayos, sensacin de mareo -problemas respiratorios -pulso cardiaco rpido o irregular -baja presin sangunea -llagas en la boca -nuseas, vmito -dolor, enrojecimiento, hinchazn o irritacin en el lugar de la inyeccin -dolor, hormigueo, entumecimiento de manos o pies -hinchazn de los tobillos, pies, manos -aumento de peso Efectos secundarios que, por lo general, no requieren Geophysical data processor (debe informarlos a su mdico o a su profesional de la salud si persisten o si son molestos): -dolor de [employer] -cada total del cabello, incluyendo el cabello de la cabeza, de las [place], el vello pbico, las cejas y las pestaas -diarrea -lagrimeo excesivo -cambios en el color de las uas de las manos -aflojamiento de las uas de las manos -nuseas -dolores musculares -enrojecimiento de la piel -sudoracin -cansancio o debilidad Puede ser que esta lista no menciona todos los posibles efectos secundarios. Comunquese a su mdico por asesoramiento mdico [employer]. Usted puede informar los efectos secundarios a la FDA por telfono al 1-800-FDA-1088. Dnde debo guardar mi medicina? Este medicamento se administra en hospitales o clnicas y no necesitar guardarlo en su  domicilio. ATENCIN: Este folleto es  un resumen. Puede ser que no cubra toda la posible informacin. Si usted tiene preguntas acerca de esta medicina, consulte con su mdico, su farmacutico o su profesional de Technical sales engineer.  2014, Elsevier/Gold Standard. (2008-11-28 16:17:39)

## 2013-12-20 ENCOUNTER — Ambulatory Visit (HOSPITAL_BASED_OUTPATIENT_CLINIC_OR_DEPARTMENT_OTHER): Payer: BC Managed Care – PPO

## 2013-12-20 VITALS — BP 134/74 | HR 84 | Temp 97.5°F

## 2013-12-20 DIAGNOSIS — Z5189 Encounter for other specified aftercare: Secondary | ICD-10-CM

## 2013-12-20 DIAGNOSIS — C221 Intrahepatic bile duct carcinoma: Secondary | ICD-10-CM

## 2013-12-20 MED ORDER — PEGFILGRASTIM INJECTION 6 MG/0.6ML
6.0000 mg | Freq: Once | SUBCUTANEOUS | Status: AC
  Administered 2013-12-20: 6 mg via SUBCUTANEOUS
  Filled 2013-12-20: qty 0.6

## 2013-12-20 NOTE — Patient Instructions (Signed)

## 2013-12-26 ENCOUNTER — Other Ambulatory Visit (HOSPITAL_BASED_OUTPATIENT_CLINIC_OR_DEPARTMENT_OTHER): Payer: BC Managed Care – PPO

## 2013-12-26 DIAGNOSIS — C221 Intrahepatic bile duct carcinoma: Secondary | ICD-10-CM

## 2013-12-26 LAB — COMPREHENSIVE METABOLIC PANEL (CC13)
ALT: 29 U/L (ref 0–55)
ANION GAP: 8 meq/L (ref 3–11)
AST: 36 U/L — ABNORMAL HIGH (ref 5–34)
Albumin: 3.4 g/dL — ABNORMAL LOW (ref 3.5–5.0)
Alkaline Phosphatase: 241 U/L — ABNORMAL HIGH (ref 40–150)
BUN: 16 mg/dL (ref 7.0–26.0)
CALCIUM: 9.6 mg/dL (ref 8.4–10.4)
CHLORIDE: 93 meq/L — AB (ref 98–109)
CO2: 27 meq/L (ref 22–29)
CREATININE: 0.9 mg/dL (ref 0.6–1.1)
Glucose: 151 mg/dl — ABNORMAL HIGH (ref 70–140)
Potassium: 4.5 mEq/L (ref 3.5–5.1)
Sodium: 128 mEq/L — ABNORMAL LOW (ref 136–145)
TOTAL PROTEIN: 6.3 g/dL — AB (ref 6.4–8.3)
Total Bilirubin: 0.48 mg/dL (ref 0.20–1.20)

## 2013-12-26 LAB — CBC WITH DIFFERENTIAL/PLATELET
BASO%: 0.4 % (ref 0.0–2.0)
Basophils Absolute: 0.1 10*3/uL (ref 0.0–0.1)
EOS%: 0.2 % (ref 0.0–7.0)
Eosinophils Absolute: 0 10*3/uL (ref 0.0–0.5)
HEMATOCRIT: 28.1 % — AB (ref 34.8–46.6)
HEMOGLOBIN: 9.7 g/dL — AB (ref 11.6–15.9)
LYMPH%: 6.4 % — ABNORMAL LOW (ref 14.0–49.7)
MCH: 32.6 pg (ref 25.1–34.0)
MCHC: 34.5 g/dL (ref 31.5–36.0)
MCV: 94.6 fL (ref 79.5–101.0)
MONO#: 1.4 10*3/uL — AB (ref 0.1–0.9)
MONO%: 9.6 % (ref 0.0–14.0)
NEUT#: 12.3 10*3/uL — ABNORMAL HIGH (ref 1.5–6.5)
NEUT%: 83.4 % — AB (ref 38.4–76.8)
Platelets: 135 10*3/uL — ABNORMAL LOW (ref 145–400)
RBC: 2.97 10*6/uL — ABNORMAL LOW (ref 3.70–5.45)
RDW: 15.1 % — AB (ref 11.2–14.5)
WBC: 14.7 10*3/uL — AB (ref 3.9–10.3)
lymph#: 0.9 10*3/uL (ref 0.9–3.3)

## 2014-01-02 ENCOUNTER — Telehealth: Payer: Self-pay | Admitting: *Deleted

## 2014-01-02 ENCOUNTER — Other Ambulatory Visit: Payer: Self-pay | Admitting: *Deleted

## 2014-01-02 ENCOUNTER — Other Ambulatory Visit (HOSPITAL_BASED_OUTPATIENT_CLINIC_OR_DEPARTMENT_OTHER): Payer: BC Managed Care – PPO

## 2014-01-02 DIAGNOSIS — C221 Intrahepatic bile duct carcinoma: Secondary | ICD-10-CM

## 2014-01-02 DIAGNOSIS — C3490 Malignant neoplasm of unspecified part of unspecified bronchus or lung: Secondary | ICD-10-CM

## 2014-01-02 LAB — COMPREHENSIVE METABOLIC PANEL (CC13)
ALBUMIN: 3.6 g/dL (ref 3.5–5.0)
ALK PHOS: 260 U/L — AB (ref 40–150)
ALT: 27 U/L (ref 0–55)
AST: 53 U/L — ABNORMAL HIGH (ref 5–34)
Anion Gap: 9 mEq/L (ref 3–11)
BUN: 14.8 mg/dL (ref 7.0–26.0)
CO2: 25 mEq/L (ref 22–29)
Calcium: 9.5 mg/dL (ref 8.4–10.4)
Chloride: 95 mEq/L — ABNORMAL LOW (ref 98–109)
Creatinine: 0.7 mg/dL (ref 0.6–1.1)
Glucose: 192 mg/dl — ABNORMAL HIGH (ref 70–140)
Potassium: 4.6 mEq/L (ref 3.5–5.1)
SODIUM: 129 meq/L — AB (ref 136–145)
Total Bilirubin: 0.59 mg/dL (ref 0.20–1.20)
Total Protein: 6.9 g/dL (ref 6.4–8.3)

## 2014-01-02 LAB — CBC WITH DIFFERENTIAL/PLATELET
BASO%: 0.6 % (ref 0.0–2.0)
BASOS ABS: 0.1 10*3/uL (ref 0.0–0.1)
EOS%: 0.1 % (ref 0.0–7.0)
Eosinophils Absolute: 0 10*3/uL (ref 0.0–0.5)
HCT: 28.9 % — ABNORMAL LOW (ref 34.8–46.6)
HGB: 9.9 g/dL — ABNORMAL LOW (ref 11.6–15.9)
LYMPH#: 1 10*3/uL (ref 0.9–3.3)
LYMPH%: 8.8 % — ABNORMAL LOW (ref 14.0–49.7)
MCH: 32.6 pg (ref 25.1–34.0)
MCHC: 34.2 g/dL (ref 31.5–36.0)
MCV: 95.4 fL (ref 79.5–101.0)
MONO#: 0.8 10*3/uL (ref 0.1–0.9)
MONO%: 7 % (ref 0.0–14.0)
NEUT#: 9.2 10*3/uL — ABNORMAL HIGH (ref 1.5–6.5)
NEUT%: 83.5 % — ABNORMAL HIGH (ref 38.4–76.8)
Platelets: 135 10*3/uL — ABNORMAL LOW (ref 145–400)
RBC: 3.03 10*6/uL — AB (ref 3.70–5.45)
RDW: 15 % — AB (ref 11.2–14.5)
WBC: 11 10*3/uL — ABNORMAL HIGH (ref 3.9–10.3)

## 2014-01-02 MED ORDER — OXYCODONE HCL 5 MG PO CAPS: 5.0000 mg | ORAL_CAPSULE | ORAL | Status: DC | PRN

## 2014-01-02 NOTE — Telephone Encounter (Signed)
Patient is here with husband and interpreter for lab appt.  She has several questions. 1. She would like to know if she can take tylenol when she wakes up with a headache.  She has liver involvement with her cancer.   2.  She is not sleeping well, specifically she is tired when she goes to bed, but when she tries to sleep her mind keeps going thinking about cooking and other things.  3.  She needs a refill of her oxycodone.  It works well for her.   4.  She finds herself very tired, especially before and after she eats. Discussed with Dr. Julien Nordmann and reviewed her labs with him. Labs are fine. 1.  Yes, she can take tylenol for a headache even with her liver involvement. 2.  For sleep:  she already has ativan that she was using for nausea, but she can use it at night to help her get to sleep. 3.. Refill script given for oxycodone. 4.  Fatigue is an expected side effect of the chemotherapy. It may help her to eat 6 small meals instead of 3 large meals, to keep her energy level on a more consistent basis.  Also let them know that it is fine for her to rest some, but that it will help her to keep her activity level up and make sure she is walking each day and that light activity will actually help her feel better. Information reviewed with patient by Rudene Anda RN (except for fatigue information that was reviewed with them by Royce Macadamia RN).   Information reviewed with patient, husband and interpreter.   Patient and husband satisfied with an answers to their questions.

## 2014-01-09 ENCOUNTER — Telehealth: Payer: Self-pay | Admitting: Internal Medicine

## 2014-01-09 ENCOUNTER — Other Ambulatory Visit (HOSPITAL_BASED_OUTPATIENT_CLINIC_OR_DEPARTMENT_OTHER): Payer: BC Managed Care – PPO

## 2014-01-09 ENCOUNTER — Ambulatory Visit (HOSPITAL_BASED_OUTPATIENT_CLINIC_OR_DEPARTMENT_OTHER): Payer: BC Managed Care – PPO

## 2014-01-09 ENCOUNTER — Other Ambulatory Visit: Payer: Self-pay

## 2014-01-09 ENCOUNTER — Encounter: Payer: Self-pay | Admitting: Internal Medicine

## 2014-01-09 ENCOUNTER — Ambulatory Visit (HOSPITAL_BASED_OUTPATIENT_CLINIC_OR_DEPARTMENT_OTHER): Payer: BC Managed Care – PPO | Admitting: Internal Medicine

## 2014-01-09 VITALS — BP 118/70 | HR 104 | Temp 97.2°F | Resp 18 | Ht 60.0 in | Wt 109.6 lb

## 2014-01-09 DIAGNOSIS — Z5111 Encounter for antineoplastic chemotherapy: Secondary | ICD-10-CM

## 2014-01-09 DIAGNOSIS — C221 Intrahepatic bile duct carcinoma: Secondary | ICD-10-CM

## 2014-01-09 DIAGNOSIS — R5383 Other fatigue: Secondary | ICD-10-CM

## 2014-01-09 DIAGNOSIS — R5381 Other malaise: Secondary | ICD-10-CM

## 2014-01-09 LAB — COMPREHENSIVE METABOLIC PANEL (CC13)
ALT: 21 U/L (ref 0–55)
ANION GAP: 11 meq/L (ref 3–11)
AST: 27 U/L (ref 5–34)
Albumin: 3.5 g/dL (ref 3.5–5.0)
Alkaline Phosphatase: 186 U/L — ABNORMAL HIGH (ref 40–150)
BILIRUBIN TOTAL: 0.3 mg/dL (ref 0.20–1.20)
BUN: 21.2 mg/dL (ref 7.0–26.0)
CO2: 24 meq/L (ref 22–29)
CREATININE: 0.8 mg/dL (ref 0.6–1.1)
Calcium: 9.7 mg/dL (ref 8.4–10.4)
Chloride: 99 mEq/L (ref 98–109)
GLUCOSE: 268 mg/dL — AB (ref 70–140)
Potassium: 4.2 mEq/L (ref 3.5–5.1)
Sodium: 133 mEq/L — ABNORMAL LOW (ref 136–145)
Total Protein: 6.9 g/dL (ref 6.4–8.3)

## 2014-01-09 LAB — CBC WITH DIFFERENTIAL/PLATELET
BASO%: 0.1 % (ref 0.0–2.0)
BASOS ABS: 0 10*3/uL (ref 0.0–0.1)
EOS ABS: 0 10*3/uL (ref 0.0–0.5)
EOS%: 0 % (ref 0.0–7.0)
HCT: 31 % — ABNORMAL LOW (ref 34.8–46.6)
HGB: 10.4 g/dL — ABNORMAL LOW (ref 11.6–15.9)
LYMPH%: 8.1 % — AB (ref 14.0–49.7)
MCH: 31.9 pg (ref 25.1–34.0)
MCHC: 33.5 g/dL (ref 31.5–36.0)
MCV: 95.1 fL (ref 79.5–101.0)
MONO#: 0.8 10*3/uL (ref 0.1–0.9)
MONO%: 5 % (ref 0.0–14.0)
NEUT%: 86.8 % — ABNORMAL HIGH (ref 38.4–76.8)
NEUTROS ABS: 14.2 10*3/uL — AB (ref 1.5–6.5)
PLATELETS: 214 10*3/uL (ref 145–400)
RBC: 3.26 10*6/uL — ABNORMAL LOW (ref 3.70–5.45)
RDW: 14.5 % (ref 11.2–14.5)
WBC: 16.3 10*3/uL — ABNORMAL HIGH (ref 3.9–10.3)
lymph#: 1.3 10*3/uL (ref 0.9–3.3)
nRBC: 0 % (ref 0–0)

## 2014-01-09 MED ORDER — SODIUM CHLORIDE 0.9 % IV SOLN
75.0000 mg/m2 | Freq: Once | INTRAVENOUS | Status: AC
  Administered 2014-01-09: 110 mg via INTRAVENOUS
  Filled 2014-01-09: qty 11

## 2014-01-09 MED ORDER — SODIUM CHLORIDE 0.9 % IJ SOLN
10.0000 mL | INTRAMUSCULAR | Status: DC | PRN
  Administered 2014-01-09: 10 mL
  Filled 2014-01-09: qty 10

## 2014-01-09 MED ORDER — HEPARIN SOD (PORK) LOCK FLUSH 100 UNIT/ML IV SOLN
500.0000 [IU] | Freq: Once | INTRAVENOUS | Status: AC | PRN
  Administered 2014-01-09: 500 [IU]
  Filled 2014-01-09: qty 5

## 2014-01-09 MED ORDER — ONDANSETRON 8 MG/NS 50 ML IVPB
INTRAVENOUS | Status: AC
  Filled 2014-01-09: qty 8

## 2014-01-09 MED ORDER — DEXAMETHASONE SODIUM PHOSPHATE 10 MG/ML IJ SOLN
10.0000 mg | Freq: Once | INTRAMUSCULAR | Status: AC
  Administered 2014-01-09: 10 mg via INTRAVENOUS

## 2014-01-09 MED ORDER — DEXAMETHASONE SODIUM PHOSPHATE 10 MG/ML IJ SOLN
INTRAMUSCULAR | Status: AC
  Filled 2014-01-09: qty 1

## 2014-01-09 MED ORDER — SODIUM CHLORIDE 0.9 % IV SOLN
Freq: Once | INTRAVENOUS | Status: AC
  Administered 2014-01-09: 11:00:00 via INTRAVENOUS

## 2014-01-09 MED ORDER — ONDANSETRON 8 MG/50ML IVPB (CHCC)
8.0000 mg | Freq: Once | INTRAVENOUS | Status: AC
  Administered 2014-01-09: 8 mg via INTRAVENOUS

## 2014-01-09 NOTE — Patient Instructions (Signed)
[facility] Discharge Instructions for Patients Receiving Chemotherapy  Today you received the following chemotherapy agents: Taxotere  To help prevent nausea and vomiting after your treatment, we encourage you to take your nausea medication as prescribed.    If you develop nausea and vomiting that is not controlled by your nausea medication, call the clinic.   BELOW ARE SYMPTOMS THAT SHOULD BE REPORTED IMMEDIATELY:  *FEVER GREATER THAN 100.5 F  *CHILLS WITH OR WITHOUT FEVER  NAUSEA AND VOMITING THAT IS NOT CONTROLLED WITH YOUR NAUSEA MEDICATION  *UNUSUAL SHORTNESS OF BREATH  *UNUSUAL BRUISING OR BLEEDING  TENDERNESS IN MOUTH AND THROAT WITH OR WITHOUT PRESENCE OF ULCERS  *URINARY PROBLEMS  *BOWEL PROBLEMS  UNUSUAL RASH Items with * indicate a potential emergency and should be followed up as soon as possible.  Feel free to call the clinic you have any questions or concerns. The clinic phone number is [phone].

## 2014-01-09 NOTE — Progress Notes (Signed)
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 First cycle on 05/23/2013. Status post 9 cycles. Cisplatin was reduced to 60 mg/M2 starting from cycle #8.   CURRENT THERAPY: Docetaxel 75 mg/M2 every 3 weeks with Neulasta support. First cycle 12/19/2013  INTERVAL HISTORY: Tammy [name] 58 y.o. female returns to the clinic today for follow up visit accompanied by her husband and the Spanish interpreter. The patient is feeling fine today and tolerated the first cycle of her systemic chemotherapy with docetaxel fairly well with no specific complaints except for more fatigue especially after the Neulasta. She denied having any significant nausea or vomiting. She has no fever or chills. The patient denied having any significant weight loss or night sweats. She has no chest pain, shortness of breath, cough or hemoptysis.   MEDICAL HISTORY: Past Medical History  Diagnosis Date  . Arthritis   . Polyp of colon 11/14/2008    seen on colonoscopy, f/u colonoscopy recommended in 5 years.   . 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  MEDICATIONS:  Current Outpatient Prescriptions  Medication Sig Dispense Refill  . dexamethasone (DECADRON) 4 MG tablet 2 tablet by mouth twice a day the day before, day of and day after the chemotherapy every 3 weeks.  40 tablet  1  . ibuprofen (ADVIL,MOTRIN) 200 MG tablet Take 400 mg by mouth every 6 (six) hours as needed for fever or moderate pain.      Marland Kitchen lidocaine-prilocaine (EMLA) cream Apply topically as needed. Apply to port 1 hr before chemo  30 g  0  . LORazepam (ATIVAN) 0.5 MG tablet Take 1 tablet (0.5 mg total) by  mouth every 8 (eight) hours as needed for anxiety.  30 tablet  0  . oxycodone (OXY-IR) 5 MG capsule Take 1 capsule (5 mg total) by mouth every 4 (four) hours as needed.  30 capsule  0  . prochlorperazine (COMPAZINE) 10 MG tablet Take 1 tablet (10 mg total) by mouth every 6 (six) hours as needed for nausea or vomiting.  30 tablet  0  . promethazine (PHENERGAN) 25 MG tablet Take 1 tablet (25 mg total) by mouth every 8 (eight) hours as needed for nausea or vomiting.  30 tablet  0   No current facility-administered medications for this visit.    SURGICAL HISTORY:  Past Surgical History  Procedure Laterality Date  . Abdominal hysterectomy  01/24/2007    including cervix, benign    REVIEW OF SYSTEMS:  Constitutional: positive for fatigue Eyes: negative Ears, nose, mouth, throat, and face: negative Respiratory: negative Cardiovascular: negative Gastrointestinal: positive for nausea Genitourinary:negative Integument/breast: negative Hematologic/lymphatic: negative Musculoskeletal:negative Neurological: negative Behavioral/Psych: positive for depression Endocrine: negative Allergic/Immunologic: negative   PHYSICAL EXAMINATION: General appearance: alert, cooperative, fatigued and no distress Head: Normocephalic, without obvious abnormality, atraumatic Neck: no adenopathy, no JVD, supple, symmetrical, trachea midline and thyroid not enlarged, symmetric, no tenderness/mass/nodules Lymph nodes: Cervical, supraclavicular, and axillary nodes normal. Resp: clear to auscultation bilaterally Back: symmetric, no curvature. ROM normal. No CVA tenderness. Cardio: regular rate and rhythm, S1, S2 normal, no murmur, click, rub or gallop GI: soft, non-tender; bowel sounds normal; no masses,  no organomegaly Extremities: extremities normal, atraumatic, no cyanosis or edema Neurologic: Alert and oriented X 3, normal strength and tone. Normal symmetric reflexes. Normal coordination and gait  ECOG  PERFORMANCE STATUS: 1 - Symptomatic but completely ambulatory  Blood pressure 118/70, pulse 104, temperature 97.2 F (36.2 C), temperature source Oral, resp. rate 18, height 5' (1.524 m), weight 109 lb 9.6 oz (49.714 kg), SpO2 98.00%.  LABORATORY DATA: Lab Results  Component Value Date   WBC 16.3* 01/09/2014   HGB 10.4* 01/09/2014   HCT 31.0* 01/09/2014   MCV 95.1 01/09/2014   PLT 214 01/09/2014      Chemistry      Component Value Date/Time   NA 129* 01/02/2014 1006   NA 126* 11/21/2013 0748   K 4.6 01/02/2014 1006   K 3.0* 11/21/2013 0748   CL 87* 11/21/2013 0748   CL 95* 05/10/2013 1249   CO2 25 01/02/2014 1006   CO2 25 11/21/2013 0748   BUN 14.8 01/02/2014 1006   BUN 9 11/21/2013 0748   CREATININE 0.7 01/02/2014 1006   CREATININE 0.53 11/21/2013 0748   CREATININE 0.39* 02/20/2013 1640      Component Value Date/Time   CALCIUM 9.5 01/02/2014 1006   CALCIUM 8.3* 11/21/2013 0748   ALKPHOS 260* 01/02/2014 1006   ALKPHOS 211* 11/20/2013 1259   AST 53* 01/02/2014 1006   AST 33 11/20/2013 1259   ALT 27 01/02/2014 1006   ALT 23 11/20/2013 1259   BILITOT 0.59 01/02/2014 1006   BILITOT 0.4 11/20/2013 1259       RADIOGRAPHIC STUDIES:   ASSESSMENT AND PLAN: This is a very pleasant 58 years old Hispanic female with metastatic cholangiocarcinoma currently on treatment with cisplatin and gemcitabine status post 9 cycles.  Her recent scan showed evidence for disease progression especially in the mean liver lesions.  She is currently on treatment with single agent docetaxel and tolerated the first cycle fairly well. I recommended for the patient to proceed with cycle #2 today as scheduled. She would come back for followup visit in 3 weeks with the start of cycle #3 of her treatment.  She was advised to call immediately if she has any concerning symptoms in the interval. The patient voices understanding of current disease status and treatment options and is in agreement with the current care  plan.  All questions were answered. The patient knows to call the clinic with any problems, questions or concerns. We can certainly see the patient much sooner if necessary.  Disclaimer: This note was dictated with voice recognition software. Similar sounding words can inadvertently be transcribed and may not be corrected upon review.

## 2014-01-09 NOTE — Patient Instructions (Signed)
Continue treatment with docetaxel as scheduled. Followup visit in 3 weeks

## 2014-01-09 NOTE — Telephone Encounter (Signed)
gv pt husband appt schedule for feb thru april.

## 2014-01-10 ENCOUNTER — Ambulatory Visit (HOSPITAL_BASED_OUTPATIENT_CLINIC_OR_DEPARTMENT_OTHER): Payer: BC Managed Care – PPO

## 2014-01-10 VITALS — BP 109/56 | HR 68 | Temp 97.6°F

## 2014-01-10 DIAGNOSIS — C221 Intrahepatic bile duct carcinoma: Secondary | ICD-10-CM

## 2014-01-10 DIAGNOSIS — Z5189 Encounter for other specified aftercare: Secondary | ICD-10-CM

## 2014-01-10 MED ORDER — PEGFILGRASTIM INJECTION 6 MG/0.6ML
6.0000 mg | Freq: Once | SUBCUTANEOUS | Status: AC
  Administered 2014-01-10: 6 mg via SUBCUTANEOUS
  Filled 2014-01-10: qty 0.6

## 2014-01-16 ENCOUNTER — Ambulatory Visit: Payer: Self-pay | Admitting: Internal Medicine

## 2014-01-16 ENCOUNTER — Other Ambulatory Visit (HOSPITAL_BASED_OUTPATIENT_CLINIC_OR_DEPARTMENT_OTHER): Payer: BC Managed Care – PPO

## 2014-01-16 DIAGNOSIS — C221 Intrahepatic bile duct carcinoma: Secondary | ICD-10-CM

## 2014-01-16 LAB — CBC WITH DIFFERENTIAL/PLATELET
BASO%: 0.3 % (ref 0.0–2.0)
Basophils Absolute: 0.1 10*3/uL (ref 0.0–0.1)
EOS%: 0.2 % (ref 0.0–7.0)
Eosinophils Absolute: 0 10*3/uL (ref 0.0–0.5)
HEMATOCRIT: 26.6 % — AB (ref 34.8–46.6)
HGB: 8.9 g/dL — ABNORMAL LOW (ref 11.6–15.9)
LYMPH#: 1.2 10*3/uL (ref 0.9–3.3)
LYMPH%: 6.5 % — ABNORMAL LOW (ref 14.0–49.7)
MCH: 31.8 pg (ref 25.1–34.0)
MCHC: 33.5 g/dL (ref 31.5–36.0)
MCV: 94.9 fL (ref 79.5–101.0)
MONO#: 2 10*3/uL — AB (ref 0.1–0.9)
MONO%: 10.6 % (ref 0.0–14.0)
NEUT#: 15.2 10*3/uL — ABNORMAL HIGH (ref 1.5–6.5)
NEUT%: 82.4 % — ABNORMAL HIGH (ref 38.4–76.8)
Platelets: 187 10*3/uL (ref 145–400)
RBC: 2.8 10*6/uL — AB (ref 3.70–5.45)
RDW: 15 % — AB (ref 11.2–14.5)
WBC: 18.5 10*3/uL — AB (ref 3.9–10.3)

## 2014-01-16 LAB — COMPREHENSIVE METABOLIC PANEL (CC13)
ALT: 24 U/L (ref 0–55)
ANION GAP: 8 meq/L (ref 3–11)
AST: 35 U/L — ABNORMAL HIGH (ref 5–34)
Albumin: 3.3 g/dL — ABNORMAL LOW (ref 3.5–5.0)
Alkaline Phosphatase: 251 U/L — ABNORMAL HIGH (ref 40–150)
BUN: 15.5 mg/dL (ref 7.0–26.0)
CALCIUM: 9.3 mg/dL (ref 8.4–10.4)
CHLORIDE: 95 meq/L — AB (ref 98–109)
CO2: 26 meq/L (ref 22–29)
Creatinine: 0.8 mg/dL (ref 0.6–1.1)
Glucose: 168 mg/dl — ABNORMAL HIGH (ref 70–140)
Potassium: 4.4 mEq/L (ref 3.5–5.1)
SODIUM: 129 meq/L — AB (ref 136–145)
TOTAL PROTEIN: 6.6 g/dL (ref 6.4–8.3)
Total Bilirubin: 0.36 mg/dL (ref 0.20–1.20)

## 2014-01-23 ENCOUNTER — Other Ambulatory Visit (HOSPITAL_BASED_OUTPATIENT_CLINIC_OR_DEPARTMENT_OTHER): Payer: BC Managed Care – PPO

## 2014-01-23 DIAGNOSIS — C221 Intrahepatic bile duct carcinoma: Secondary | ICD-10-CM

## 2014-01-23 LAB — COMPREHENSIVE METABOLIC PANEL (CC13)
ALT: 18 U/L (ref 0–55)
ANION GAP: 8 meq/L (ref 3–11)
AST: 37 U/L — AB (ref 5–34)
Albumin: 3.5 g/dL (ref 3.5–5.0)
Alkaline Phosphatase: 284 U/L — ABNORMAL HIGH (ref 40–150)
BILIRUBIN TOTAL: 0.45 mg/dL (ref 0.20–1.20)
BUN: 15.4 mg/dL (ref 7.0–26.0)
CHLORIDE: 96 meq/L — AB (ref 98–109)
CO2: 27 meq/L (ref 22–29)
CREATININE: 0.7 mg/dL (ref 0.6–1.1)
Calcium: 9.8 mg/dL (ref 8.4–10.4)
Glucose: 138 mg/dl (ref 70–140)
Potassium: 4.5 mEq/L (ref 3.5–5.1)
Sodium: 132 mEq/L — ABNORMAL LOW (ref 136–145)
Total Protein: 7 g/dL (ref 6.4–8.3)

## 2014-01-23 LAB — CBC WITH DIFFERENTIAL/PLATELET
BASO%: 0.5 % (ref 0.0–2.0)
BASOS ABS: 0.1 10*3/uL (ref 0.0–0.1)
EOS%: 0.1 % (ref 0.0–7.0)
Eosinophils Absolute: 0 10*3/uL (ref 0.0–0.5)
HEMATOCRIT: 28 % — AB (ref 34.8–46.6)
HEMOGLOBIN: 9.3 g/dL — AB (ref 11.6–15.9)
LYMPH#: 1.2 10*3/uL (ref 0.9–3.3)
LYMPH%: 9.4 % — AB (ref 14.0–49.7)
MCH: 31.8 pg (ref 25.1–34.0)
MCHC: 33.3 g/dL (ref 31.5–36.0)
MCV: 95.6 fL (ref 79.5–101.0)
MONO#: 0.6 10*3/uL (ref 0.1–0.9)
MONO%: 4.7 % (ref 0.0–14.0)
NEUT#: 11 10*3/uL — ABNORMAL HIGH (ref 1.5–6.5)
NEUT%: 85.3 % — AB (ref 38.4–76.8)
Platelets: 174 10*3/uL (ref 145–400)
RBC: 2.93 10*6/uL — ABNORMAL LOW (ref 3.70–5.45)
RDW: 15 % — ABNORMAL HIGH (ref 11.2–14.5)
WBC: 12.9 10*3/uL — ABNORMAL HIGH (ref 3.9–10.3)

## 2014-01-30 ENCOUNTER — Encounter: Payer: Self-pay | Admitting: Internal Medicine

## 2014-01-30 ENCOUNTER — Telehealth: Payer: Self-pay | Admitting: Internal Medicine

## 2014-01-30 ENCOUNTER — Other Ambulatory Visit (HOSPITAL_BASED_OUTPATIENT_CLINIC_OR_DEPARTMENT_OTHER): Payer: BC Managed Care – PPO

## 2014-01-30 ENCOUNTER — Encounter: Payer: Self-pay | Admitting: Physician Assistant

## 2014-01-30 ENCOUNTER — Ambulatory Visit (HOSPITAL_BASED_OUTPATIENT_CLINIC_OR_DEPARTMENT_OTHER): Payer: BC Managed Care – PPO | Admitting: Physician Assistant

## 2014-01-30 ENCOUNTER — Ambulatory Visit (HOSPITAL_BASED_OUTPATIENT_CLINIC_OR_DEPARTMENT_OTHER): Payer: BC Managed Care – PPO

## 2014-01-30 ENCOUNTER — Telehealth: Payer: Self-pay | Admitting: *Deleted

## 2014-01-30 VITALS — BP 128/70 | HR 88 | Temp 97.2°F | Resp 18 | Ht 60.0 in | Wt 110.7 lb

## 2014-01-30 DIAGNOSIS — R1013 Epigastric pain: Secondary | ICD-10-CM

## 2014-01-30 DIAGNOSIS — R5381 Other malaise: Secondary | ICD-10-CM

## 2014-01-30 DIAGNOSIS — C787 Secondary malignant neoplasm of liver and intrahepatic bile duct: Secondary | ICD-10-CM

## 2014-01-30 DIAGNOSIS — C3490 Malignant neoplasm of unspecified part of unspecified bronchus or lung: Secondary | ICD-10-CM

## 2014-01-30 DIAGNOSIS — C221 Intrahepatic bile duct carcinoma: Secondary | ICD-10-CM

## 2014-01-30 DIAGNOSIS — Z5111 Encounter for antineoplastic chemotherapy: Secondary | ICD-10-CM

## 2014-01-30 DIAGNOSIS — R5383 Other fatigue: Secondary | ICD-10-CM

## 2014-01-30 LAB — CBC WITH DIFFERENTIAL/PLATELET
BASO%: 0.1 % (ref 0.0–2.0)
BASOS ABS: 0 10*3/uL (ref 0.0–0.1)
EOS ABS: 0 10*3/uL (ref 0.0–0.5)
EOS%: 0 % (ref 0.0–7.0)
HCT: 27.6 % — ABNORMAL LOW (ref 34.8–46.6)
HEMOGLOBIN: 9.1 g/dL — AB (ref 11.6–15.9)
LYMPH#: 1.1 10*3/uL (ref 0.9–3.3)
LYMPH%: 6.3 % — ABNORMAL LOW (ref 14.0–49.7)
MCH: 31.2 pg (ref 25.1–34.0)
MCHC: 33 g/dL (ref 31.5–36.0)
MCV: 94.5 fL (ref 79.5–101.0)
MONO#: 0.8 10*3/uL (ref 0.1–0.9)
MONO%: 4.6 % (ref 0.0–14.0)
NEUT%: 89 % — ABNORMAL HIGH (ref 38.4–76.8)
NEUTROS ABS: 15.7 10*3/uL — AB (ref 1.5–6.5)
Platelets: 195 10*3/uL (ref 145–400)
RBC: 2.92 10*6/uL — ABNORMAL LOW (ref 3.70–5.45)
RDW: 15 % — ABNORMAL HIGH (ref 11.2–14.5)
WBC: 17.7 10*3/uL — ABNORMAL HIGH (ref 3.9–10.3)
nRBC: 0 % (ref 0–0)

## 2014-01-30 LAB — COMPREHENSIVE METABOLIC PANEL (CC13)
ALBUMIN: 3.5 g/dL (ref 3.5–5.0)
ALK PHOS: 258 U/L — AB (ref 40–150)
ALT: 20 U/L (ref 0–55)
ANION GAP: 9 meq/L (ref 3–11)
AST: 31 U/L (ref 5–34)
BUN: 22 mg/dL (ref 7.0–26.0)
CHLORIDE: 101 meq/L (ref 98–109)
CO2: 24 meq/L (ref 22–29)
Calcium: 9.7 mg/dL (ref 8.4–10.4)
Creatinine: 0.6 mg/dL (ref 0.6–1.1)
GLUCOSE: 134 mg/dL (ref 70–140)
POTASSIUM: 4.2 meq/L (ref 3.5–5.1)
SODIUM: 133 meq/L — AB (ref 136–145)
TOTAL PROTEIN: 7.2 g/dL (ref 6.4–8.3)
Total Bilirubin: 0.31 mg/dL (ref 0.20–1.20)

## 2014-01-30 MED ORDER — SODIUM CHLORIDE 0.9 % IV SOLN
75.0000 mg/m2 | Freq: Once | INTRAVENOUS | Status: AC
  Administered 2014-01-30: 110 mg via INTRAVENOUS
  Filled 2014-01-30: qty 11

## 2014-01-30 MED ORDER — SODIUM CHLORIDE 0.9 % IV SOLN
Freq: Once | INTRAVENOUS | Status: AC
  Administered 2014-01-30: 12:00:00 via INTRAVENOUS

## 2014-01-30 MED ORDER — DEXAMETHASONE SODIUM PHOSPHATE 10 MG/ML IJ SOLN
10.0000 mg | Freq: Once | INTRAMUSCULAR | Status: AC
  Administered 2014-01-30: 10 mg via INTRAVENOUS

## 2014-01-30 MED ORDER — HEPARIN SOD (PORK) LOCK FLUSH 100 UNIT/ML IV SOLN
500.0000 [IU] | Freq: Once | INTRAVENOUS | Status: AC | PRN
  Administered 2014-01-30: 500 [IU]
  Filled 2014-01-30: qty 5

## 2014-01-30 MED ORDER — SODIUM CHLORIDE 0.9 % IJ SOLN
10.0000 mL | INTRAMUSCULAR | Status: DC | PRN
  Administered 2014-01-30: 10 mL
  Filled 2014-01-30: qty 10

## 2014-01-30 MED ORDER — ONDANSETRON 8 MG/NS 50 ML IVPB
INTRAVENOUS | Status: AC
  Filled 2014-01-30: qty 8

## 2014-01-30 MED ORDER — OXYCODONE HCL 5 MG PO CAPS: 5.0000 mg | ORAL_CAPSULE | ORAL | Status: DC | PRN

## 2014-01-30 MED ORDER — ONDANSETRON 8 MG/50ML IVPB (CHCC)
8.0000 mg | Freq: Once | INTRAVENOUS | Status: AC
  Administered 2014-01-30: 8 mg via INTRAVENOUS

## 2014-01-30 MED ORDER — DEXAMETHASONE SODIUM PHOSPHATE 10 MG/ML IJ SOLN
INTRAMUSCULAR | Status: AC
  Filled 2014-01-30: qty 1

## 2014-01-30 NOTE — Telephone Encounter (Signed)
gave pt appt for lab,md and chemo for March , emailed michelle regarding chemo for April 2015

## 2014-01-30 NOTE — Progress Notes (Addendum)
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 First cycle on 05/23/2013. Status post 9 cycles. Cisplatin was reduced to 60 mg/M2 starting from cycle #8.   CURRENT THERAPY: Docetaxel 75 mg/M2 every 3 weeks with Neulasta support. First cycle 12/19/2013. Status post 2 cycles  INTERVAL HISTORY: Tammy [name] 58 y.o. female returns to the clinic today for follow up visit accompanied by her husband and the Spanish interpreter. The patient is feeling fine today except for some mild midepigastric pain. She also reports some numbness and tingling affecting her feet and hands although worse in her feet. She has not had any falls related to the numbness in her feet. She does report difficulty grasping items but has not dropped anything related to the numbness in her fingers. She is eating a little bit better. She presents today to proceed with cycle #3 of her systemic chemotherapy with docetaxel with Neulasta support. She requests a refill for her oxycodone tablets. She denied having any significant nausea or vomiting. She has no fever or chills. The patient denied having any significant weight loss or night sweats. She has no chest pain, shortness of breath, cough or hemoptysis. She continues to have some fatigue.  MEDICAL HISTORY: Past Medical History  Diagnosis Date  . Arthritis   . Polyp of colon 11/14/2008    seen on colonoscopy, f/u colonoscopy recommended in 5 years.   . 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  MEDICATIONS:  Current Outpatient Prescriptions  Medication Sig Dispense Refill  . dexamethasone (DECADRON) 4 MG tablet 2 tablet  by mouth twice a day the day before, day of and day after the chemotherapy every 3 weeks.  40 tablet  1  . lidocaine-prilocaine (EMLA) cream Apply topically as needed. Apply to port 1 hr before chemo  30 g  0  . oxycodone (OXY-IR) 5 MG capsule Take 1 capsule (5 mg total) by mouth every 4 (four) hours as needed.  30 capsule  0  . ibuprofen (ADVIL,MOTRIN) 200 MG tablet Take 400 mg by mouth every 6 (six) hours as needed for fever or moderate pain.      Marland Kitchen LORazepam (ATIVAN) 0.5 MG tablet Take 1 tablet (0.5 mg total) by mouth every 8 (eight) hours as needed for anxiety.  30 tablet  0  . prochlorperazine (COMPAZINE) 10 MG tablet Take 1 tablet (10 mg total) by mouth every 6 (six) hours as needed for nausea or vomiting.  30 tablet  0  . promethazine (PHENERGAN) 25 MG tablet Take 1 tablet (25 mg total) by mouth every 8 (eight) hours as needed for nausea or vomiting.  30 tablet  0   No current facility-administered medications for this visit.    SURGICAL HISTORY:  Past Surgical History  Procedure Laterality Date  . Abdominal hysterectomy  01/24/2007    including cervix, benign    REVIEW OF SYSTEMS:  Constitutional: positive for fatigue Eyes: negative Ears, nose, mouth, throat, and face: negative Respiratory: negative Cardiovascular: negative Gastrointestinal: positive for abdominal pain Genitourinary:negative Integument/breast: negative Hematologic/lymphatic: negative Musculoskeletal:negative Neurological: positive for paresthesia Behavioral/Psych: positive for depression Endocrine: negative Allergic/Immunologic: negative   PHYSICAL EXAMINATION:  General appearance: alert, cooperative, fatigued and no distress Head: Normocephalic, without obvious abnormality, atraumatic Neck: no adenopathy, no JVD, supple, symmetrical, trachea midline and thyroid not enlarged, symmetric, no tenderness/mass/nodules Lymph nodes: Cervical, supraclavicular, and axillary nodes normal. Resp: clear to auscultation  bilaterally Back: symmetric, no curvature. ROM normal. No CVA tenderness. Cardio: regular rate and rhythm, S1, S2 normal, no murmur, click, rub or gallop GI: soft, non-tender; bowel sounds normal; no masses,  no organomegaly Extremities: extremities normal, atraumatic, no cyanosis or edema Neurologic: Alert and oriented X 3, normal strength and tone. Normal symmetric reflexes. Normal coordination and gait  ECOG PERFORMANCE STATUS: 1 - Symptomatic but completely ambulatory  Blood pressure 128/70, pulse 88, temperature 97.2 F (36.2 C), temperature source Oral, resp. rate 18, height 5' (1.524 m), weight 110 lb 11.2 oz (50.213 kg), SpO2 100.00%.  LABORATORY DATA: Lab Results  Component Value Date   WBC 17.7* 01/30/2014   HGB 9.1* 01/30/2014   HCT 27.6* 01/30/2014   MCV 94.5 01/30/2014   PLT 195 01/30/2014      Chemistry      Component Value Date/Time   NA 132* 01/23/2014 0940   NA 126* 11/21/2013 0748   K 4.5 01/23/2014 0940   K 3.0* 11/21/2013 0748   CL 87* 11/21/2013 0748   CL 95* 05/10/2013 1249   CO2 27 01/23/2014 0940   CO2 25 11/21/2013 0748   BUN 15.4 01/23/2014 0940   BUN 9 11/21/2013 0748   CREATININE 0.7 01/23/2014 0940   CREATININE 0.53 11/21/2013 0748   CREATININE 0.39* 02/20/2013 1640      Component Value Date/Time   CALCIUM 9.8 01/23/2014 0940   CALCIUM 8.3* 11/21/2013 0748   ALKPHOS 284* 01/23/2014 0940   ALKPHOS 211* 11/20/2013 1259   AST 37* 01/23/2014 0940   AST 33 11/20/2013 1259   ALT 18 01/23/2014 0940   ALT 23 11/20/2013 1259   BILITOT 0.45 01/23/2014 0940   BILITOT 0.4 11/20/2013 1259       RADIOGRAPHIC STUDIES:   ASSESSMENT AND PLAN: This is a very pleasant 58 years old Hispanic female with metastatic cholangiocarcinoma currently on treatment with cisplatin and gemcitabine status post 9 cycles.  Her recent scan showed evidence for disease progression especially in the mean liver lesions.  She is currently on treatment with single agent docetaxel, now status post 2  cycles. Overall she's tolerating this regimen fairly well. Patient was discussed with and also seen by Dr. Julien Nordmann. She'll proceed with cycle #3 of her systemic chemotherapy with single agent docetaxel with Neulasta support. She'll continue with weekly labs as scheduled and followup with Dr. Julien Nordmann in 3 weeks with a restaging CT scan of her chest, abdomen and pelvis to reevaluate her disease. She was given a refill prescription for her oxycodone tablets as requested.   She was advised to call immediately if she has any concerning symptoms in the interval. The patient voices understanding of current disease status and treatment options and is in agreement with the current care plan.  All questions were answered. The patient knows to call the clinic with any problems, questions or concerns. We can certainly see the patient much sooner if necessary.  Carlton Adam PA-C  ADDENDUM: Hematology/Oncology Attending: I had a face to face encounter with the patient today. I recommended her care plan. This is a very pleasant 58 years old Hispanic female diagnosed with metastatic cholangiocarcinoma in June of 2014 status post systemic chemotherapy with cisplatin and gemcitabine for 8 cycles but  has evidence for disease progression. She is currently on second line chemotherapy with single agent docetaxel status post 2 cycles. The patient is tolerating his treatment fairly well except for the fatigue and weakness especially after the Neulasta injection. I recommended for her to proceed with cycle #3 today as scheduled. She would come back for followup visit in 3 weeks after repeating CT scan of the chest, abdomen and pelvis for restaging of her disease. The patient was given a refill of her pain medication. She was advised to call immediately if she has any concerning symptoms in the interval.  Disclaimer: This note was dictated with voice recognition software. Similar sounding words can inadvertently be  transcribed and may not be corrected upon review. Eilleen Kempf., MD 01/30/2014

## 2014-01-30 NOTE — Patient Instructions (Signed)
Continue weekly labs as scheduled Followup with Dr. Julien Nordmann in 3 weeks with restaging CT scan of your chest, abdomen and pelvis to reevaluate your disease

## 2014-01-30 NOTE — Telephone Encounter (Signed)
Per staff message and POF I have scheduled appts.  [name]  

## 2014-01-30 NOTE — Patient Instructions (Signed)
[facility] Discharge Instructions for Patients Receiving Chemotherapy  Today you received the following chemotherapy agents taxotere   To help prevent nausea and vomiting after your treatment, we encourage you to take your nausea medication as directed   If you develop nausea and vomiting that is not controlled by your nausea medication, call the clinic.   BELOW ARE SYMPTOMS THAT SHOULD BE REPORTED IMMEDIATELY:  *FEVER GREATER THAN 100.5 F  *CHILLS WITH OR WITHOUT FEVER  NAUSEA AND VOMITING THAT IS NOT CONTROLLED WITH YOUR NAUSEA MEDICATION  *UNUSUAL SHORTNESS OF BREATH  *UNUSUAL BRUISING OR BLEEDING  TENDERNESS IN MOUTH AND THROAT WITH OR WITHOUT PRESENCE OF ULCERS  *URINARY PROBLEMS  *BOWEL PROBLEMS  UNUSUAL RASH Items with * indicate a potential emergency and should be followed up as soon as possible.  Feel free to call the clinic you have any questions or concerns. The clinic phone number is [phone].  

## 2014-01-31 ENCOUNTER — Ambulatory Visit (HOSPITAL_BASED_OUTPATIENT_CLINIC_OR_DEPARTMENT_OTHER): Payer: BC Managed Care – PPO

## 2014-01-31 ENCOUNTER — Telehealth: Payer: Self-pay | Admitting: Internal Medicine

## 2014-01-31 VITALS — BP 97/52 | HR 62 | Temp 97.7°F

## 2014-01-31 DIAGNOSIS — Z5189 Encounter for other specified aftercare: Secondary | ICD-10-CM

## 2014-01-31 DIAGNOSIS — C221 Intrahepatic bile duct carcinoma: Secondary | ICD-10-CM

## 2014-01-31 MED ORDER — PEGFILGRASTIM INJECTION 6 MG/0.6ML
6.0000 mg | Freq: Once | SUBCUTANEOUS | Status: AC
  Administered 2014-01-31: 6 mg via SUBCUTANEOUS
  Filled 2014-01-31: qty 0.6

## 2014-01-31 NOTE — Telephone Encounter (Signed)
talked to pt and gave her appt for [facility] and April 2015

## 2014-02-06 ENCOUNTER — Other Ambulatory Visit (HOSPITAL_BASED_OUTPATIENT_CLINIC_OR_DEPARTMENT_OTHER): Payer: BC Managed Care – PPO

## 2014-02-06 DIAGNOSIS — C221 Intrahepatic bile duct carcinoma: Secondary | ICD-10-CM

## 2014-02-06 DIAGNOSIS — C787 Secondary malignant neoplasm of liver and intrahepatic bile duct: Secondary | ICD-10-CM

## 2014-02-06 LAB — CBC WITH DIFFERENTIAL/PLATELET
BASO%: 0.7 % (ref 0.0–2.0)
BASOS ABS: 0.1 10*3/uL (ref 0.0–0.1)
EOS%: 0.3 % (ref 0.0–7.0)
Eosinophils Absolute: 0 10*3/uL (ref 0.0–0.5)
HCT: 25.5 % — ABNORMAL LOW (ref 34.8–46.6)
HGB: 8.5 g/dL — ABNORMAL LOW (ref 11.6–15.9)
LYMPH%: 7 % — ABNORMAL LOW (ref 14.0–49.7)
MCH: 31.8 pg (ref 25.1–34.0)
MCHC: 33.4 g/dL (ref 31.5–36.0)
MCV: 95.4 fL (ref 79.5–101.0)
MONO#: 1.2 10*3/uL — AB (ref 0.1–0.9)
MONO%: 9.8 % (ref 0.0–14.0)
NEUT#: 9.8 10*3/uL — ABNORMAL HIGH (ref 1.5–6.5)
NEUT%: 82.2 % — ABNORMAL HIGH (ref 38.4–76.8)
PLATELETS: 158 10*3/uL (ref 145–400)
RBC: 2.67 10*6/uL — ABNORMAL LOW (ref 3.70–5.45)
RDW: 15.1 % — ABNORMAL HIGH (ref 11.2–14.5)
WBC: 11.9 10*3/uL — AB (ref 3.9–10.3)
lymph#: 0.8 10*3/uL — ABNORMAL LOW (ref 0.9–3.3)

## 2014-02-06 LAB — COMPREHENSIVE METABOLIC PANEL (CC13)
ALBUMIN: 3.3 g/dL — AB (ref 3.5–5.0)
ALK PHOS: 315 U/L — AB (ref 40–150)
ALT: 31 U/L (ref 0–55)
AST: 43 U/L — AB (ref 5–34)
Anion Gap: 9 mEq/L (ref 3–11)
BUN: 15.1 mg/dL (ref 7.0–26.0)
CALCIUM: 9.3 mg/dL (ref 8.4–10.4)
CHLORIDE: 99 meq/L (ref 98–109)
CO2: 23 mEq/L (ref 22–29)
Creatinine: 0.7 mg/dL (ref 0.6–1.1)
Glucose: 172 mg/dl — ABNORMAL HIGH (ref 70–140)
POTASSIUM: 4.3 meq/L (ref 3.5–5.1)
SODIUM: 131 meq/L — AB (ref 136–145)
TOTAL PROTEIN: 6.3 g/dL — AB (ref 6.4–8.3)
Total Bilirubin: 0.44 mg/dL (ref 0.20–1.20)

## 2014-02-13 ENCOUNTER — Telehealth: Payer: Self-pay | Admitting: *Deleted

## 2014-02-13 ENCOUNTER — Other Ambulatory Visit (HOSPITAL_BASED_OUTPATIENT_CLINIC_OR_DEPARTMENT_OTHER): Payer: BC Managed Care – PPO

## 2014-02-13 DIAGNOSIS — C221 Intrahepatic bile duct carcinoma: Secondary | ICD-10-CM

## 2014-02-13 LAB — COMPREHENSIVE METABOLIC PANEL (CC13)
ALBUMIN: 3.3 g/dL — AB (ref 3.5–5.0)
ALK PHOS: 406 U/L — AB (ref 40–150)
ALT: 21 U/L (ref 0–55)
AST: 41 U/L — ABNORMAL HIGH (ref 5–34)
Anion Gap: 10 mEq/L (ref 3–11)
BILIRUBIN TOTAL: 0.62 mg/dL (ref 0.20–1.20)
BUN: 13.6 mg/dL (ref 7.0–26.0)
CO2: 23 mEq/L (ref 22–29)
Calcium: 9.7 mg/dL (ref 8.4–10.4)
Chloride: 100 mEq/L (ref 98–109)
Creatinine: 0.7 mg/dL (ref 0.6–1.1)
GLUCOSE: 149 mg/dL — AB (ref 70–140)
Potassium: 4.2 mEq/L (ref 3.5–5.1)
SODIUM: 134 meq/L — AB (ref 136–145)
Total Protein: 6.9 g/dL (ref 6.4–8.3)

## 2014-02-13 LAB — CBC WITH DIFFERENTIAL/PLATELET
BASO%: 0.4 % (ref 0.0–2.0)
Basophils Absolute: 0 10*3/uL (ref 0.0–0.1)
EOS ABS: 0 10*3/uL (ref 0.0–0.5)
EOS%: 0.2 % (ref 0.0–7.0)
HCT: 26.9 % — ABNORMAL LOW (ref 34.8–46.6)
HGB: 8.9 g/dL — ABNORMAL LOW (ref 11.6–15.9)
LYMPH%: 8.7 % — AB (ref 14.0–49.7)
MCH: 31.5 pg (ref 25.1–34.0)
MCHC: 33 g/dL (ref 31.5–36.0)
MCV: 95.3 fL (ref 79.5–101.0)
MONO#: 0.6 10*3/uL (ref 0.1–0.9)
MONO%: 4.5 % (ref 0.0–14.0)
NEUT#: 11.3 10*3/uL — ABNORMAL HIGH (ref 1.5–6.5)
NEUT%: 86.2 % — ABNORMAL HIGH (ref 38.4–76.8)
Platelets: 165 10*3/uL (ref 145–400)
RBC: 2.83 10*6/uL — AB (ref 3.70–5.45)
RDW: 15.4 % — ABNORMAL HIGH (ref 11.2–14.5)
WBC: 13.1 10*3/uL — ABNORMAL HIGH (ref 3.9–10.3)
lymph#: 1.1 10*3/uL (ref 0.9–3.3)

## 2014-02-13 NOTE — Telephone Encounter (Signed)
Pt's husband called asking if "OK to take regular meds before CT scan"  Per CT dept; notified pt husband that its OK for pt to take regular meds like normal on 3/30.  Pt's husband verbalized understanding of instructions.

## 2014-02-18 ENCOUNTER — Encounter (HOSPITAL_COMMUNITY): Payer: Self-pay

## 2014-02-18 ENCOUNTER — Ambulatory Visit (HOSPITAL_COMMUNITY)
Admission: RE | Admit: 2014-02-18 | Discharge: 2014-02-18 | Disposition: A | Discharge status: Home / Self Care | Payer: BC Managed Care – PPO | Source: Ambulatory Visit | Attending: Physician Assistant | Admitting: Physician Assistant

## 2014-02-18 DIAGNOSIS — C787 Secondary malignant neoplasm of liver and intrahepatic bile duct: Secondary | ICD-10-CM | POA: Insufficient documentation

## 2014-02-18 DIAGNOSIS — C221 Intrahepatic bile duct carcinoma: Secondary | ICD-10-CM | POA: Insufficient documentation

## 2014-02-18 MED ORDER — IOHEXOL 300 MG/ML  SOLN
100.0000 mL | Freq: Once | INTRAMUSCULAR | Status: AC | PRN
  Administered 2014-02-18: 100 mL via INTRAVENOUS

## 2014-02-20 ENCOUNTER — Ambulatory Visit: Payer: Self-pay

## 2014-02-20 ENCOUNTER — Encounter: Payer: Self-pay | Admitting: *Deleted

## 2014-02-20 ENCOUNTER — Other Ambulatory Visit (HOSPITAL_BASED_OUTPATIENT_CLINIC_OR_DEPARTMENT_OTHER): Payer: BC Managed Care – PPO

## 2014-02-20 ENCOUNTER — Encounter: Payer: Self-pay | Admitting: Internal Medicine

## 2014-02-20 ENCOUNTER — Ambulatory Visit (HOSPITAL_BASED_OUTPATIENT_CLINIC_OR_DEPARTMENT_OTHER): Payer: BC Managed Care – PPO | Admitting: Internal Medicine

## 2014-02-20 ENCOUNTER — Telehealth: Payer: Self-pay | Admitting: Internal Medicine

## 2014-02-20 VITALS — BP 135/75 | HR 100 | Temp 97.0°F | Resp 20 | Ht 60.0 in | Wt 111.5 lb

## 2014-02-20 DIAGNOSIS — C221 Intrahepatic bile duct carcinoma: Secondary | ICD-10-CM

## 2014-02-20 DIAGNOSIS — C787 Secondary malignant neoplasm of liver and intrahepatic bile duct: Secondary | ICD-10-CM

## 2014-02-20 DIAGNOSIS — C3490 Malignant neoplasm of unspecified part of unspecified bronchus or lung: Secondary | ICD-10-CM

## 2014-02-20 LAB — CBC WITH DIFFERENTIAL/PLATELET
BASO%: 0.1 % (ref 0.0–2.0)
Basophils Absolute: 0 10*3/uL (ref 0.0–0.1)
EOS%: 0 % (ref 0.0–7.0)
Eosinophils Absolute: 0 10*3/uL (ref 0.0–0.5)
HEMATOCRIT: 28.2 % — AB (ref 34.8–46.6)
HGB: 9.3 g/dL — ABNORMAL LOW (ref 11.6–15.9)
LYMPH%: 5.3 % — AB (ref 14.0–49.7)
MCH: 31.1 pg (ref 25.1–34.0)
MCHC: 33 g/dL (ref 31.5–36.0)
MCV: 94.3 fL (ref 79.5–101.0)
MONO#: 0.8 10*3/uL (ref 0.1–0.9)
MONO%: 5 % (ref 0.0–14.0)
NEUT#: 14.5 10*3/uL — ABNORMAL HIGH (ref 1.5–6.5)
NEUT%: 89.6 % — AB (ref 38.4–76.8)
PLATELETS: 196 10*3/uL (ref 145–400)
RBC: 2.99 10*6/uL — ABNORMAL LOW (ref 3.70–5.45)
RDW: 15.2 % — ABNORMAL HIGH (ref 11.2–14.5)
WBC: 16.1 10*3/uL — AB (ref 3.9–10.3)
lymph#: 0.9 10*3/uL (ref 0.9–3.3)
nRBC: 0 % (ref 0–0)

## 2014-02-20 MED ORDER — DEXAMETHASONE SODIUM PHOSPHATE 10 MG/ML IJ SOLN
INTRAMUSCULAR | Status: AC
  Filled 2014-02-20: qty 1

## 2014-02-20 MED ORDER — OXYCODONE HCL 5 MG PO CAPS: 5.0000 mg | ORAL_CAPSULE | ORAL | Status: DC | PRN

## 2014-02-20 MED ORDER — ONDANSETRON 8 MG/NS 50 ML IVPB
INTRAVENOUS | Status: AC
  Filled 2014-02-20: qty 8

## 2014-02-20 MED ORDER — CAPECITABINE 500 MG PO TABS: 625.0000 mg/m2 | ORAL_TABLET | Freq: Two times a day (BID) | ORAL | Status: DC

## 2014-02-20 NOTE — Progress Notes (Signed)
Prior authorization request for Xeloda received from [facility]. Forwarded to managed care department.

## 2014-02-20 NOTE — Telephone Encounter (Signed)
gv pt appt schedule for april. per 4/1 pof cx all lb/tx and schedule lb/fu 3wks.

## 2014-02-20 NOTE — Progress Notes (Signed)
[place] [phone]   [phone]) [phone]  OFFICE PROGRESS NOTE  Eilleen Kempf., MD [place] [address]  DIAGNOSIS: Metastatic cholangiocarcinoma diagnosed in June of 2014   PRIOR THERAPY:  1) Systemic chemotherapy with cisplatin 100 mg/M2 on day 1 and gemcitabine 800 mg/M2 on days 1 and 8 every 3 weeks. First cycle on 05/23/2013. Status post 9 cycles. Cisplatin was reduced to 60 mg/M2 starting from cycle #8.  2) Docetaxel 75 mg/M2 every 3 weeks with Neulasta support, status post 3 cycles. First cycle 12/19/2013  CURRENT THERAPY: Xeloda 625 mg/M2 twice a day for 14 days every 3 weeks. First dose expected in the next few days.  INTERVAL HISTORY: Tammy [name] 58 y.o. female returns to the clinic today for follow up visit accompanied by her husband and the Spanish interpreter. The patient is feeling fine today and tolerated the her systemic chemotherapy with docetaxel fairly well with no specific complaints except for more fatigue especially after the Neulasta. She also has pain on the right upper quadrant but well-controlled with her current medication with oxycodone. She denied having any significant nausea or vomiting. She has no fever or chills. The patient denied having any significant weight loss or night sweats. She has no chest pain, shortness of breath, cough or hemoptysis. She had repeat CT scan of the chest, abdomen and pelvis performed recently and she is here today for evaluation and discussion of her scan results.  MEDICAL HISTORY: Past Medical History  Diagnosis Date  . Arthritis   . Polyp of colon 11/14/2008    seen on colonoscopy, f/u colonoscopy recommended in 5 years.   . 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  MEDICATIONS:  Current Outpatient Prescriptions  Medication Sig Dispense Refill  . dexamethasone (DECADRON) 4 MG tablet 2 tablet by mouth twice a day the day before, day of  and day after the chemotherapy every 3 weeks.  40 tablet  1  . ibuprofen (ADVIL,MOTRIN) 200 MG tablet Take 400 mg by mouth every 6 (six) hours as needed for fever or moderate pain.      Marland Kitchen lidocaine-prilocaine (EMLA) cream Apply topically as needed. Apply to port 1 hr before chemo  30 g  0  . LORazepam (ATIVAN) 0.5 MG tablet Take 1 tablet (0.5 mg total) by mouth every 8 (eight) hours as needed for anxiety.  30 tablet  0  . oxycodone (OXY-IR) 5 MG capsule Take 1 capsule (5 mg total) by mouth every 4 (four) hours as needed.  30 capsule  0  . prochlorperazine (COMPAZINE) 10 MG tablet Take 1 tablet (10 mg total) by mouth every 6 (six) hours as needed for nausea or vomiting.  30 tablet  0  . promethazine (PHENERGAN) 25 MG tablet Take 1 tablet (25 mg total) by mouth every 8 (eight) hours as needed for nausea or vomiting.  30 tablet  0   No current facility-administered medications for this visit.    SURGICAL HISTORY:  Past Surgical History  Procedure Laterality Date  . Abdominal hysterectomy  01/24/2007    including cervix, benign    REVIEW OF SYSTEMS:  Constitutional: positive for fatigue Eyes: negative Ears, nose, mouth, throat, and face: negative Respiratory: negative Cardiovascular: negative Gastrointestinal: positive for nausea Genitourinary:negative Integument/breast: negative Hematologic/lymphatic: negative Musculoskeletal:negative Neurological: negative Behavioral/Psych: positive for depression Endocrine: negative Allergic/Immunologic: negative   PHYSICAL EXAMINATION: General appearance: alert, cooperative, fatigued and no distress Head:  Normocephalic, without obvious abnormality, atraumatic Neck: no adenopathy, no JVD, supple, symmetrical, trachea midline and thyroid not enlarged, symmetric, no tenderness/mass/nodules Lymph nodes: Cervical, supraclavicular, and axillary nodes normal. Resp: clear to auscultation bilaterally Back: symmetric, no curvature. ROM normal. No CVA  tenderness. Cardio: regular rate and rhythm, S1, S2 normal, no murmur, click, rub or gallop GI: soft, non-tender; bowel sounds normal; no masses,  no organomegaly Extremities: extremities normal, atraumatic, no cyanosis or edema Neurologic: Alert and oriented X 3, normal strength and tone. Normal symmetric reflexes. Normal coordination and gait  ECOG PERFORMANCE STATUS: 1 - Symptomatic but completely ambulatory  Blood pressure 135/75, pulse 100, temperature 97 F (36.1 C), temperature source Oral, resp. rate 20, height 5' (1.524 m), weight 111 lb 8 oz (50.576 kg), SpO2 100.00%.  LABORATORY DATA: Lab Results  Component Value Date   WBC 16.1* 02/20/2014   HGB 9.3* 02/20/2014   HCT 28.2* 02/20/2014   MCV 94.3 02/20/2014   PLT 196 02/20/2014      Chemistry      Component Value Date/Time   NA 134* 02/13/2014 0905   NA 126* 11/21/2013 0748   K 4.2 02/13/2014 0905   K 3.0* 11/21/2013 0748   CL 87* 11/21/2013 0748   CL 95* 05/10/2013 1249   CO2 23 02/13/2014 0905   CO2 25 11/21/2013 0748   BUN 13.6 02/13/2014 0905   BUN 9 11/21/2013 0748   CREATININE 0.7 02/13/2014 0905   CREATININE 0.53 11/21/2013 0748   CREATININE 0.39* 02/20/2013 1640      Component Value Date/Time   CALCIUM 9.7 02/13/2014 0905   CALCIUM 8.3* 11/21/2013 0748   ALKPHOS 406* 02/13/2014 0905   ALKPHOS 211* 11/20/2013 1259   AST 41* 02/13/2014 0905   AST 33 11/20/2013 1259   ALT 21 02/13/2014 0905   ALT 23 11/20/2013 1259   BILITOT 0.62 02/13/2014 0905   BILITOT 0.4 11/20/2013 1259       RADIOGRAPHIC STUDIES: Ct Chest W Contrast  02/18/2014   CLINICAL DATA:  Restaging metastatic cholangiocarcinoma. Patient undergoing chemotherapy status post 9 cycles.  EXAM: CT CHEST, ABDOMEN, AND PELVIS WITH CONTRAST  TECHNIQUE: Multidetector CT imaging of the chest, abdomen and pelvis was performed following the standard protocol during bolus administration of intravenous contrast.  CONTRAST:  140mL OMNIPAQUE IOHEXOL 300 MG/ML  SOLN   COMPARISON:  CT CHEST W/CM dated 11/21/2013; CT CHEST W/CM dated 09/19/2013; CT CHEST W/CM dated 07/20/2013  FINDINGS:   CT CHEST FINDINGS  Port in the right anterior chest wall. No axillary or supraclavicular lymphadenopathy. No mediastinal hilar lymphadenopathy. No pericardial fluid. There is a prevascular lymph node measuring 39 x 18 mm which is increased in thickness from 33 by 11 mm on prior.  Review of the lung parenchyma demonstrates no suspicious pulmonary nodules.    CT ABDOMEN AND PELVIS FINDINGS  There has been marked progression of hepatic metastatic disease. The dominant lesion in the right hepatic lobe decrease in fluid volume measuring 13 x 9 cm in axial dimension compared to 10 x 7.2 cm on prior. Lesion in the left hepatic lobe measures 18 mm compared to 8 mm on prior (image 62, series 2). 15 mm lesion in the left hepatic lobe (image 62) is barely discernible on comparison exam. There several new lesions in the left hepatic lobe including a 13 mm lesion (image 48).  There is no biliary ductal dilatation evident. Patient status post cholecystectomy. Pancreas, spleen, adrenal glands, and kidneys are normal.  There are several  venous collateralsat the splenic hilum. There is a small splenorenal shunt on the left.  The stomach, small bowel, appendix, cecum normal. The colon rectosigmoid colon are normal.  Abdominal aorta is normal caliber. There are multiple small periaortic lymph nodes. Some of these have irregular morphology including a 13 mm lymph node (image 66, series 2). This node is increased in size from 8 mm on prior. Left periaortic lymph node measuring 11 mm (image 73 is increased from 8 mm on prior.  Small amount free fluid the pelvis. The bladder is normal. Post hysterectomy anatomy. No pelvic lymphadenopathy. No aggressive osseous lesion.    IMPRESSION: 1. Enlargement of prevascular lymph node is concerning for mediastinal metastasis. 2. Unfortunately there has been significant  progression of hepatic metastasis with enlargement of previous seen lesions and several new lesions. 3. Mild enlargement of periaortic lymph nodes consistent with nodal metastasis. 4. Evidence of portal hypertension with splenic venous collateral circulation. 5. Small amount of free fluid the pelvis may relate to portal hypertension.   Electronically Signed   By: Suzy Bouchard M.D.   On: 02/18/2014 09:03   ASSESSMENT AND PLAN: This is a very pleasant 58 years old Hispanic female with metastatic cholangiocarcinoma currently on treatment with cisplatin and gemcitabine status post 9 cycles, discontinued after disease progression.  She also completed 3 cycles of second line chemotherapy with docetaxel but unfortunately the recent CT scan of the chest, abdomen and pelvis showed evidence for disease progression with significant progression of the hepatic metastasis and several new lesions. I discussed the scan results with the patient and her husband and showed them the images of the scan. I discussed with the patient several options for treatment of her condition including palliative care and hospice referral versus consideration of treatment with third line chemotherapy in the form of Xeloda 625 mg/M2 twice a day for 14 days every 3 weeks. The patient is interested in proceeding with chemotherapy. I discussed with the patient the adverse effect of this treatment and she is in agreement to proceed with the treatment as planned. She would come back for followup visit in 3 weeks for reevaluation and management any adverse effect of her treatment.  She was advised to call immediately if she has any concerning symptoms in the interval. The patient voices understanding of current disease status and treatment options and is in agreement with the current care plan.  All questions were answered. The patient knows to call the clinic with any problems, questions or concerns. We can certainly see the patient much  sooner if necessary. I spent 20 minutes of face-to-face counseling with the patient and her husband out of the total visit time 30 minutes.  Disclaimer: This note was dictated with voice recognition software. Similar sounding words can inadvertently be transcribed and may not be corrected upon review.

## 2014-02-21 ENCOUNTER — Encounter: Payer: Self-pay | Admitting: Internal Medicine

## 2014-02-21 ENCOUNTER — Ambulatory Visit: Payer: Self-pay

## 2014-02-21 NOTE — Progress Notes (Signed)
[facility], [id number], approved xeloda from 02/21/14-02/22/16 [id number]; patient will have to use [facility] if [facility] is unable to fill.

## 2014-02-27 ENCOUNTER — Other Ambulatory Visit: Payer: Self-pay

## 2014-03-06 ENCOUNTER — Other Ambulatory Visit: Payer: Self-pay

## 2014-03-11 ENCOUNTER — Other Ambulatory Visit: Payer: Self-pay | Admitting: *Deleted

## 2014-03-11 DIAGNOSIS — C221 Intrahepatic bile duct carcinoma: Secondary | ICD-10-CM

## 2014-03-11 DIAGNOSIS — C3490 Malignant neoplasm of unspecified part of unspecified bronchus or lung: Secondary | ICD-10-CM

## 2014-03-11 MED ORDER — OXYCODONE HCL 5 MG PO CAPS: 5.0000 mg | ORAL_CAPSULE | ORAL | Status: DC | PRN

## 2014-03-13 ENCOUNTER — Ambulatory Visit (HOSPITAL_BASED_OUTPATIENT_CLINIC_OR_DEPARTMENT_OTHER): Payer: BC Managed Care – PPO | Admitting: Internal Medicine

## 2014-03-13 ENCOUNTER — Other Ambulatory Visit: Payer: Self-pay

## 2014-03-13 ENCOUNTER — Encounter: Payer: Self-pay | Admitting: Internal Medicine

## 2014-03-13 ENCOUNTER — Ambulatory Visit: Payer: Self-pay

## 2014-03-13 ENCOUNTER — Telehealth: Payer: Self-pay | Admitting: Internal Medicine

## 2014-03-13 ENCOUNTER — Other Ambulatory Visit: Payer: Self-pay | Admitting: *Deleted

## 2014-03-13 ENCOUNTER — Other Ambulatory Visit (HOSPITAL_BASED_OUTPATIENT_CLINIC_OR_DEPARTMENT_OTHER): Payer: BC Managed Care – PPO

## 2014-03-13 VITALS — BP 121/80 | HR 130 | Temp 98.0°F | Resp 20 | Ht 60.0 in | Wt 117.2 lb

## 2014-03-13 DIAGNOSIS — C3490 Malignant neoplasm of unspecified part of unspecified bronchus or lung: Secondary | ICD-10-CM

## 2014-03-13 DIAGNOSIS — C221 Intrahepatic bile duct carcinoma: Secondary | ICD-10-CM

## 2014-03-13 DIAGNOSIS — C787 Secondary malignant neoplasm of liver and intrahepatic bile duct: Secondary | ICD-10-CM

## 2014-03-13 LAB — CBC WITH DIFFERENTIAL/PLATELET
BASO%: 0.3 % (ref 0.0–2.0)
Basophils Absolute: 0 10*3/uL (ref 0.0–0.1)
EOS ABS: 0.1 10*3/uL (ref 0.0–0.5)
EOS%: 1 % (ref 0.0–7.0)
HCT: 29.5 % — ABNORMAL LOW (ref 34.8–46.6)
HGB: 9.9 g/dL — ABNORMAL LOW (ref 11.6–15.9)
LYMPH%: 11.5 % — ABNORMAL LOW (ref 14.0–49.7)
MCH: 32.4 pg (ref 25.1–34.0)
MCHC: 33.6 g/dL (ref 31.5–36.0)
MCV: 96.2 fL (ref 79.5–101.0)
MONO#: 0.6 10*3/uL (ref 0.1–0.9)
MONO%: 6.3 % (ref 0.0–14.0)
NEUT#: 7.4 10*3/uL — ABNORMAL HIGH (ref 1.5–6.5)
NEUT%: 80.9 % — AB (ref 38.4–76.8)
PLATELETS: 196 10*3/uL (ref 145–400)
RBC: 3.07 10*6/uL — ABNORMAL LOW (ref 3.70–5.45)
RDW: 18.1 % — ABNORMAL HIGH (ref 11.2–14.5)
WBC: 9.2 10*3/uL (ref 3.9–10.3)
lymph#: 1.1 10*3/uL (ref 0.9–3.3)

## 2014-03-13 LAB — COMPREHENSIVE METABOLIC PANEL (CC13)
ALK PHOS: 570 U/L — AB (ref 40–150)
ALT: 47 U/L (ref 0–55)
ANION GAP: 7 meq/L (ref 3–11)
AST: 99 U/L — ABNORMAL HIGH (ref 5–34)
Albumin: 2.9 g/dL — ABNORMAL LOW (ref 3.5–5.0)
BUN: 14.6 mg/dL (ref 7.0–26.0)
CO2: 25 meq/L (ref 22–29)
CREATININE: 0.7 mg/dL (ref 0.6–1.1)
Calcium: 10.6 mg/dL — ABNORMAL HIGH (ref 8.4–10.4)
Chloride: 95 mEq/L — ABNORMAL LOW (ref 98–109)
GLUCOSE: 156 mg/dL — AB (ref 70–140)
Potassium: 4.1 mEq/L (ref 3.5–5.1)
Sodium: 127 mEq/L — ABNORMAL LOW (ref 136–145)
Total Bilirubin: 2.56 mg/dL — ABNORMAL HIGH (ref 0.20–1.20)
Total Protein: 6.9 g/dL (ref 6.4–8.3)

## 2014-03-13 LAB — HOLD TUBE, BLOOD BANK

## 2014-03-13 LAB — CEA: CEA: 32 ng/mL — ABNORMAL HIGH (ref 0.0–5.0)

## 2014-03-13 MED ORDER — CAPECITABINE 500 MG PO TABS: 625.0000 mg/m2 | ORAL_TABLET | Freq: Two times a day (BID) | ORAL | Status: DC

## 2014-03-13 MED ORDER — OXYCODONE HCL 5 MG PO CAPS: 5.0000 mg | ORAL_CAPSULE | ORAL | Status: DC | PRN

## 2014-03-13 NOTE — Progress Notes (Signed)
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 First cycle on 05/23/2013. Status post 9 cycles. Cisplatin was reduced to 60 mg/M2 starting from cycle #8.  2) Docetaxel 75 mg/M2 every 3 weeks with Neulasta support, status post 3 cycles. First cycle 12/19/2013  CURRENT THERAPY: Xeloda 625 mg/M2 twice a day for 14 days every 3 weeks. First dose started 02/22/2014 .  INTERVAL HISTORY: Tammy VOYTKO 58 y.o. female returns to the clinic today for follow up visit accompanied by her husband and the Spanish interpreter. She also has pain on the right upper quadrant but well-controlled with her current medication with oxycodone but sometimes she has to use 2 tablets instead of one every 6 hours. She tolerated the first cycle of her treatment with Xeloda fairly well. She denied having any significant nausea or vomiting. She has no fever or chills. The patient denied having any significant weight loss or night sweats. She has no chest pain, shortness of breath, cough or hemoptysis. She is more tired and fatigued. She sleeps a lot.  MEDICAL HISTORY: Past Medical History  Diagnosis Date  . Arthritis   . Polyp of colon 11/14/2008    seen on colonoscopy, f/u colonoscopy recommended in 5 years.   . 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  MEDICATIONS:  Current Outpatient Prescriptions  Medication Sig Dispense Refill  . oxycodone (OXY-IR) 5 MG capsule Take 1 capsule (5 mg total) by mouth every 4 (four) hours as needed.  30 capsule  0  . capecitabine (XELODA) 500 MG tablet Take 2 tablets (1,000 mg total) by mouth 2 (two) times daily after a meal.  56 tablet  2    . dexamethasone (DECADRON) 4 MG tablet 2 tablet by mouth twice a day the day before, day of and day after the chemotherapy every 3 weeks.  40 tablet  1  . ibuprofen (ADVIL,MOTRIN) 200 MG tablet Take 400 mg by mouth every 6 (six) hours as needed for fever or moderate pain.      Marland Kitchen lidocaine-prilocaine (EMLA) cream Apply topically as needed. Apply to port 1 hr before chemo  30 g  0  . LORazepam (ATIVAN) 0.5 MG tablet Take 1 tablet (0.5 mg total) by mouth every 8 (eight) hours as needed for anxiety.  30 tablet  0  . prochlorperazine (COMPAZINE) 10 MG tablet Take 1 tablet (10 mg total) by mouth every 6 (six) hours as needed for nausea or vomiting.  30 tablet  0  . promethazine (PHENERGAN) 25 MG tablet Take 1 tablet (25 mg total) by mouth every 8 (eight) hours as needed for nausea or vomiting.  30 tablet  0   No current facility-administered medications for this visit.    SURGICAL HISTORY:  Past Surgical History  Procedure Laterality Date  . Abdominal hysterectomy  01/24/2007    including cervix, benign    REVIEW OF SYSTEMS:  Constitutional: positive for fatigue Eyes: negative Ears, nose, mouth, throat, and face: negative Respiratory: negative Cardiovascular: negative Gastrointestinal: positive for nausea Genitourinary:negative Integument/breast: negative Hematologic/lymphatic: negative Musculoskeletal:negative Neurological: negative Behavioral/Psych: positive for depression Endocrine: negative Allergic/Immunologic: negative   PHYSICAL EXAMINATION: General appearance:  alert, cooperative, fatigued, icteric and no distress Head: Normocephalic, without obvious abnormality, atraumatic Neck: no adenopathy, no JVD, supple, symmetrical, trachea midline and thyroid not enlarged, symmetric, no tenderness/mass/nodules Lymph nodes: Cervical, supraclavicular, and axillary nodes normal. Resp: clear to auscultation bilaterally Back: symmetric, no curvature. ROM normal. No CVA tenderness. Cardio:  regular rate and rhythm, S1, S2 normal, no murmur, click, rub or gallop GI: soft, non-tender; bowel sounds normal; no masses,  no organomegaly Extremities: extremities normal, atraumatic, no cyanosis or edema Neurologic: Alert and oriented X 3, normal strength and tone. Normal symmetric reflexes. Normal coordination and gait  ECOG PERFORMANCE STATUS: 1 - Symptomatic but completely ambulatory  Blood pressure 121/80, pulse 130, temperature 98 F (36.7 C), temperature source Oral, resp. rate 20, height 5' (1.524 m), weight 117 lb 3.2 oz (53.162 kg).  LABORATORY DATA: Lab Results  Component Value Date   WBC 9.2 03/13/2014   HGB 9.9* 03/13/2014   HCT 29.5* 03/13/2014   MCV 96.2 03/13/2014   PLT 196 03/13/2014      Chemistry      Component Value Date/Time   NA 134* 02/13/2014 0905   NA 126* 11/21/2013 0748   K 4.2 02/13/2014 0905   K 3.0* 11/21/2013 0748   CL 87* 11/21/2013 0748   CL 95* 05/10/2013 1249   CO2 23 02/13/2014 0905   CO2 25 11/21/2013 0748   BUN 13.6 02/13/2014 0905   BUN 9 11/21/2013 0748   CREATININE 0.7 02/13/2014 0905   CREATININE 0.53 11/21/2013 0748   CREATININE 0.39* 02/20/2013 1640      Component Value Date/Time   CALCIUM 9.7 02/13/2014 0905   CALCIUM 8.3* 11/21/2013 0748   ALKPHOS 406* 02/13/2014 0905   ALKPHOS 211* 11/20/2013 1259   AST 41* 02/13/2014 0905   AST 33 11/20/2013 1259   ALT 21 02/13/2014 0905   ALT 23 11/20/2013 1259   BILITOT 0.62 02/13/2014 0905   BILITOT 0.4 11/20/2013 1259       RADIOGRAPHIC STUDIES:  ASSESSMENT AND PLAN: This is a very pleasant 58 years old Hispanic female with metastatic cholangiocarcinoma currently on treatment with cisplatin and gemcitabine status post 9 cycles, discontinued after disease progression.  She also completed 3 cycles of second line chemotherapy with docetaxel but unfortunately the recent CT scan of the chest, abdomen and pelvis showed evidence for disease progression with significant progression of the hepatic  metastasis and several new lesions. She is currently on treatment with Xeloda 650 mg/M2 twice a day status post 1 cycle and tolerating her treatment fairly well. The patient has icteric sclera today concerning for progression of her disease. I recommended for her to proceed with the second cycle of her treatment and she would come back for followup visit in 3 weeks with repeat CT scan of the chest, abdomen and pelvis for restaging of her condition. She was advised to call immediately if she has any concerning symptoms in the interval. The patient was given a refill of her pain medication.  She was advised to call immediately if she has any concerning symptoms in the interval. The patient voices understanding of current disease status and treatment options and is in agreement with the current care plan.  All questions were answered. The patient knows to call the clinic with any problems, questions or concerns. We can certainly see the patient much sooner if necessary.  Disclaimer: This note was dictated with voice recognition software. Similar sounding words can inadvertently be transcribed and may not be corrected upon  review.

## 2014-03-13 NOTE — Telephone Encounter (Signed)
gv and pritned aptps ched and avs for pt for May...gv pt barium

## 2014-03-14 ENCOUNTER — Ambulatory Visit: Payer: Self-pay

## 2014-03-14 ENCOUNTER — Other Ambulatory Visit: Payer: Self-pay | Admitting: *Deleted

## 2014-03-14 DIAGNOSIS — C221 Intrahepatic bile duct carcinoma: Secondary | ICD-10-CM

## 2014-03-14 DIAGNOSIS — C3490 Malignant neoplasm of unspecified part of unspecified bronchus or lung: Secondary | ICD-10-CM

## 2014-03-14 MED ORDER — CAPECITABINE 500 MG PO TABS: 625.0000 mg/m2 | ORAL_TABLET | Freq: Two times a day (BID) | ORAL | Status: DC

## 2014-03-20 ENCOUNTER — Other Ambulatory Visit: Payer: Self-pay

## 2014-03-24 ENCOUNTER — Encounter (HOSPITAL_COMMUNITY): Payer: Self-pay | Admitting: Emergency Medicine

## 2014-03-24 ENCOUNTER — Emergency Department (HOSPITAL_COMMUNITY): Payer: BC Managed Care – PPO

## 2014-03-24 ENCOUNTER — Inpatient Hospital Stay (HOSPITAL_COMMUNITY): Payer: BC Managed Care – PPO

## 2014-03-24 ENCOUNTER — Inpatient Hospital Stay (HOSPITAL_COMMUNITY)
Admission: EM | Admit: 2014-03-24 | Discharge: 2014-03-28 | DRG: 872 | Disposition: A | Discharge status: Hospice | Payer: BC Managed Care – PPO | Attending: Internal Medicine | Admitting: Internal Medicine

## 2014-03-24 DIAGNOSIS — M1990 Unspecified osteoarthritis, unspecified site: Secondary | ICD-10-CM

## 2014-03-24 DIAGNOSIS — J918 Pleural effusion in other conditions classified elsewhere: Secondary | ICD-10-CM | POA: Diagnosis present

## 2014-03-24 DIAGNOSIS — C771 Secondary and unspecified malignant neoplasm of intrathoracic lymph nodes: Secondary | ICD-10-CM | POA: Diagnosis present

## 2014-03-24 DIAGNOSIS — K766 Portal hypertension: Secondary | ICD-10-CM | POA: Diagnosis present

## 2014-03-24 DIAGNOSIS — R188 Other ascites: Secondary | ICD-10-CM | POA: Diagnosis present

## 2014-03-24 DIAGNOSIS — I8390 Asymptomatic varicose veins of unspecified lower extremity: Secondary | ICD-10-CM

## 2014-03-24 DIAGNOSIS — J9819 Other pulmonary collapse: Secondary | ICD-10-CM | POA: Diagnosis present

## 2014-03-24 DIAGNOSIS — M129 Arthropathy, unspecified: Secondary | ICD-10-CM | POA: Diagnosis present

## 2014-03-24 DIAGNOSIS — Z515 Encounter for palliative care: Secondary | ICD-10-CM

## 2014-03-24 DIAGNOSIS — D696 Thrombocytopenia, unspecified: Secondary | ICD-10-CM

## 2014-03-24 DIAGNOSIS — E8779 Other fluid overload: Secondary | ICD-10-CM | POA: Diagnosis present

## 2014-03-24 DIAGNOSIS — Z79899 Other long term (current) drug therapy: Secondary | ICD-10-CM

## 2014-03-24 DIAGNOSIS — Z860100 Personal history of colon polyps, unspecified: Secondary | ICD-10-CM

## 2014-03-24 DIAGNOSIS — D638 Anemia in other chronic diseases classified elsewhere: Secondary | ICD-10-CM | POA: Diagnosis present

## 2014-03-24 DIAGNOSIS — E871 Hypo-osmolality and hyponatremia: Secondary | ICD-10-CM | POA: Diagnosis present

## 2014-03-24 DIAGNOSIS — K769 Liver disease, unspecified: Secondary | ICD-10-CM | POA: Diagnosis present

## 2014-03-24 DIAGNOSIS — M7989 Other specified soft tissue disorders: Secondary | ICD-10-CM | POA: Diagnosis present

## 2014-03-24 DIAGNOSIS — R509 Fever, unspecified: Secondary | ICD-10-CM | POA: Diagnosis present

## 2014-03-24 DIAGNOSIS — I10 Essential (primary) hypertension: Secondary | ICD-10-CM | POA: Diagnosis present

## 2014-03-24 DIAGNOSIS — C221 Intrahepatic bile duct carcinoma: Secondary | ICD-10-CM

## 2014-03-24 DIAGNOSIS — D6481 Anemia due to antineoplastic chemotherapy: Secondary | ICD-10-CM | POA: Diagnosis present

## 2014-03-24 DIAGNOSIS — E876 Hypokalemia: Secondary | ICD-10-CM

## 2014-03-24 DIAGNOSIS — J90 Pleural effusion, not elsewhere classified: Secondary | ICD-10-CM | POA: Diagnosis present

## 2014-03-24 DIAGNOSIS — E7800 Pure hypercholesterolemia, unspecified: Secondary | ICD-10-CM

## 2014-03-24 DIAGNOSIS — A419 Sepsis, unspecified organism: Principal | ICD-10-CM | POA: Diagnosis present

## 2014-03-24 DIAGNOSIS — I498 Other specified cardiac arrhythmias: Secondary | ICD-10-CM | POA: Diagnosis present

## 2014-03-24 DIAGNOSIS — J209 Acute bronchitis, unspecified: Secondary | ICD-10-CM

## 2014-03-24 DIAGNOSIS — R7989 Other specified abnormal findings of blood chemistry: Secondary | ICD-10-CM | POA: Diagnosis present

## 2014-03-24 DIAGNOSIS — Z7401 Bed confinement status: Secondary | ICD-10-CM

## 2014-03-24 DIAGNOSIS — Z0000 Encounter for general adult medical examination without abnormal findings: Secondary | ICD-10-CM

## 2014-03-24 DIAGNOSIS — R109 Unspecified abdominal pain: Secondary | ICD-10-CM

## 2014-03-24 DIAGNOSIS — T451X5A Adverse effect of antineoplastic and immunosuppressive drugs, initial encounter: Secondary | ICD-10-CM

## 2014-03-24 DIAGNOSIS — C787 Secondary malignant neoplasm of liver and intrahepatic bile duct: Secondary | ICD-10-CM | POA: Diagnosis present

## 2014-03-24 DIAGNOSIS — C3490 Malignant neoplasm of unspecified part of unspecified bronchus or lung: Secondary | ICD-10-CM

## 2014-03-24 DIAGNOSIS — R6510 Systemic inflammatory response syndrome (SIRS) of non-infectious origin without acute organ dysfunction: Secondary | ICD-10-CM | POA: Diagnosis present

## 2014-03-24 HISTORY — DX: Essential (primary) hypertension: I10

## 2014-03-24 HISTORY — DX: Intrahepatic bile duct carcinoma: C22.1

## 2014-03-24 HISTORY — DX: Intrahepatic bile duct carcinoma: C78.89

## 2014-03-24 LAB — PRO B NATRIURETIC PEPTIDE: PRO B NATRI PEPTIDE: 823.4 pg/mL — AB (ref 0–125)

## 2014-03-24 LAB — COMPREHENSIVE METABOLIC PANEL
ALT: 11 U/L (ref 0–35)
ALT: 10 U/L (ref 0–35)
AST: 115 U/L — ABNORMAL HIGH (ref 0–37)
AST: 101 U/L — AB (ref 0–37)
Albumin: 2.6 g/dL — ABNORMAL LOW (ref 3.5–5.2)
Albumin: 2.2 g/dL — ABNORMAL LOW (ref 3.5–5.2)
Alkaline Phosphatase: 497 U/L — ABNORMAL HIGH (ref 39–117)
Alkaline Phosphatase: 424 U/L — ABNORMAL HIGH (ref 39–117)
BUN: 18 mg/dL (ref 6–23)
BUN: 17 mg/dL (ref 6–23)
CALCIUM: 10 mg/dL (ref 8.4–10.5)
CALCIUM: 9.5 mg/dL (ref 8.4–10.5)
CO2: 25 mEq/L (ref 19–32)
CO2: 26 meq/L (ref 19–32)
CREATININE: 0.37 mg/dL — AB (ref 0.50–1.10)
Chloride: 87 mEq/L — ABNORMAL LOW (ref 96–112)
Chloride: 90 mEq/L — ABNORMAL LOW (ref 96–112)
Creatinine, Ser: 0.4 mg/dL — ABNORMAL LOW (ref 0.50–1.10)
GFR calc Af Amer: >90 mL/min (ref >90)
GFR calc non Af Amer: >90 mL/min (ref >90)
GLUCOSE: 147 mg/dL — AB (ref 70–99)
Glucose, Bld: 118 mg/dL — ABNORMAL HIGH (ref 70–99)
Potassium: 4.5 mEq/L (ref 3.7–5.3)
Potassium: 4.3 mEq/L (ref 3.7–5.3)
SODIUM: 124 meq/L — AB (ref 137–147)
Sodium: 126 mEq/L — ABNORMAL LOW (ref 137–147)
TOTAL PROTEIN: 6.5 g/dL (ref 6.0–8.3)
Total Bilirubin: 4.6 mg/dL — ABNORMAL HIGH (ref 0.3–1.2)
Total Bilirubin: 5 mg/dL — ABNORMAL HIGH (ref 0.3–1.2)
Total Protein: 5.7 g/dL — ABNORMAL LOW (ref 6.0–8.3)

## 2014-03-24 LAB — CBC
HCT: 25.5 % — ABNORMAL LOW (ref 36.0–46.0)
HEMATOCRIT: 26.7 % — AB (ref 36.0–46.0)
HEMOGLOBIN: 9.3 g/dL — AB (ref 12.0–15.0)
HEMOGLOBIN: 8.8 g/dL — AB (ref 12.0–15.0)
MCH: 33.5 pg (ref 26.0–34.0)
MCH: 33.7 pg (ref 26.0–34.0)
MCHC: 34.8 g/dL (ref 30.0–36.0)
MCHC: 34.5 g/dL (ref 30.0–36.0)
MCV: 96 fL (ref 78.0–100.0)
MCV: 97.7 fL (ref 78.0–100.0)
Platelets: 204 10*3/uL (ref 150–400)
Platelets: 154 10*3/uL (ref 150–400)
RBC: 2.78 MIL/uL — ABNORMAL LOW (ref 3.87–5.11)
RBC: 2.61 MIL/uL — AB (ref 3.87–5.11)
RDW: 22.2 % — ABNORMAL HIGH (ref 11.5–15.5)
RDW: 22.1 % — ABNORMAL HIGH (ref 11.5–15.5)
WBC: 11.2 10*3/uL — ABNORMAL HIGH (ref 4.0–10.5)
WBC: 7.8 10*3/uL (ref 4.0–10.5)

## 2014-03-24 LAB — URINE MICROSCOPIC-ADD ON

## 2014-03-24 LAB — BODY FLUID CELL COUNT WITH DIFFERENTIAL
Eos, Fluid: 0 %
Lymphs, Fluid: 25 %
Monocyte-Macrophage-Serous Fluid: 65 % (ref 50–90)
NEUTROPHIL FLUID: 10 % (ref 0–25)
WBC FLUID: 188 uL (ref 0–1000)

## 2014-03-24 LAB — URINALYSIS, ROUTINE W REFLEX MICROSCOPIC
Glucose, UA: NEGATIVE mg/dL
Hgb urine dipstick: NEGATIVE
KETONES UR: NEGATIVE mg/dL
LEUKOCYTES UA: NEGATIVE
NITRITE: NEGATIVE
PROTEIN: 30 mg/dL — AB
Specific Gravity, Urine: 1.023 (ref 1.005–1.030)
Urobilinogen, UA: 1 mg/dL (ref 0.0–1.0)
pH: 5.5 (ref 5.0–8.0)

## 2014-03-24 LAB — PROTIME-INR
INR: 1.52 — ABNORMAL HIGH (ref 0.00–1.49)
Prothrombin Time: 17.9 seconds — ABNORMAL HIGH (ref 11.6–15.2)

## 2014-03-24 LAB — PHOSPHORUS: Phosphorus: 2.1 mg/dL — ABNORMAL LOW (ref 2.3–4.6)

## 2014-03-24 LAB — TROPONIN I: Troponin I: <0.3 ng/mL (ref <0.30)

## 2014-03-24 LAB — PROTEIN, BODY FLUID: Total protein, fluid: 2.3 g/dL

## 2014-03-24 LAB — MAGNESIUM: Magnesium: 1.6 mg/dL (ref 1.5–2.5)

## 2014-03-24 LAB — TSH: TSH: 4.32 u[IU]/mL (ref 0.350–4.500)

## 2014-03-24 LAB — LIPASE, BLOOD: LIPASE: 16 U/L (ref 11–59)

## 2014-03-24 LAB — AMMONIA: Ammonia: 37 umol/L (ref 11–60)

## 2014-03-24 MED ORDER — SODIUM CHLORIDE 0.9 % IJ SOLN
10.0000 mL | INTRAMUSCULAR | Status: DC | PRN
  Administered 2014-03-25 – 2014-03-28 (×4): 10 mL

## 2014-03-24 MED ORDER — IBUPROFEN 200 MG PO TABS
600.0000 mg | ORAL_TABLET | Freq: Once | ORAL | Status: AC
  Administered 2014-03-24: 600 mg via ORAL
  Filled 2014-03-24: qty 3

## 2014-03-24 MED ORDER — ACETAMINOPHEN 325 MG PO TABS: 650.0000 mg | ORAL_TABLET | Freq: Four times a day (QID) | ORAL | Status: DC | PRN

## 2014-03-24 MED ORDER — ONDANSETRON HCL 4 MG/2ML IJ SOLN
4.0000 mg | Freq: Four times a day (QID) | INTRAMUSCULAR | Status: DC | PRN
  Administered 2014-03-25: 4 mg via INTRAVENOUS
  Filled 2014-03-24 (×2): qty 2

## 2014-03-24 MED ORDER — SPIRONOLACTONE 12.5 MG HALF TABLET
12.5000 mg | ORAL_TABLET | Freq: Every day | ORAL | Status: DC
  Administered 2014-03-24 – 2014-03-28 (×5): 12.5 mg via ORAL
  Filled 2014-03-24 (×5): qty 1

## 2014-03-24 MED ORDER — SODIUM CHLORIDE 0.9 % IJ SOLN
3.0000 mL | Freq: Two times a day (BID) | INTRAMUSCULAR | Status: DC
  Administered 2014-03-24 – 2014-03-27 (×2): 3 mL via INTRAVENOUS

## 2014-03-24 MED ORDER — OXYCODONE HCL 5 MG PO TABS
5.0000 mg | ORAL_TABLET | ORAL | Status: DC | PRN
  Administered 2014-03-24 – 2014-03-28 (×10): 5 mg via ORAL
  Filled 2014-03-24 (×11): qty 1

## 2014-03-24 MED ORDER — LEVOFLOXACIN IN D5W 750 MG/150ML IV SOLN
750.0000 mg | Freq: Once | INTRAVENOUS | Status: AC
  Administered 2014-03-24: 750 mg via INTRAVENOUS
  Filled 2014-03-24: qty 150

## 2014-03-24 MED ORDER — ONDANSETRON HCL 4 MG PO TABS: 4.0000 mg | ORAL_TABLET | Freq: Four times a day (QID) | ORAL | Status: DC | PRN

## 2014-03-24 MED ORDER — LEVOFLOXACIN IN D5W 750 MG/150ML IV SOLN
750.0000 mg | INTRAVENOUS | Status: DC
  Administered 2014-03-25 – 2014-03-28 (×4): 750 mg via INTRAVENOUS
  Filled 2014-03-24 (×4): qty 150

## 2014-03-24 MED ORDER — FUROSEMIDE 20 MG PO TABS
20.0000 mg | ORAL_TABLET | Freq: Two times a day (BID) | ORAL | Status: DC
  Administered 2014-03-24 – 2014-03-25 (×3): 20 mg via ORAL
  Filled 2014-03-24 (×6): qty 1

## 2014-03-24 MED ORDER — DOCUSATE SODIUM 100 MG PO CAPS
100.0000 mg | ORAL_CAPSULE | Freq: Two times a day (BID) | ORAL | Status: DC
  Administered 2014-03-24 – 2014-03-28 (×9): 100 mg via ORAL
  Filled 2014-03-24 (×10): qty 1

## 2014-03-24 MED ORDER — ACETAMINOPHEN 650 MG RE SUPP: 650.0000 mg | Freq: Four times a day (QID) | RECTAL | Status: DC | PRN

## 2014-03-24 MED ORDER — SODIUM CHLORIDE 0.9 % IV SOLN
INTRAVENOUS | Status: DC
  Administered 2014-03-24: 02:00:00 via INTRAVENOUS

## 2014-03-24 NOTE — Progress Notes (Signed)
Pt seen and examined, admitted this am by Dr.Doutova 57/F with metastatic cholangiocarcinoma, liver mets currently on Xeloda followed by Dr.Mohamed was admitted with fever, dyspnea, and failure to thrive Fever/SIRS -etiology unclear, CXR with possible atelectasis-agree with CT chest -continue levaquin for now -D/w [place] for paracentesis to r/o SBP today, FU cell counts/culture -FU Blood CX  Hyponatremia -hypervolemic/third spacing clinically -continue diuretics for now  Abnormal LFTS -likely due to liver mets  ANemia -due to chronic disease/chemo -monitor  Metastatic cholangiocarcinoma - hold Xeloda at this time -Notify Dr.Mohamed on Monday  Domenic Polite, MD [phone]

## 2014-03-24 NOTE — Procedures (Signed)
  US guided RLQ para  2 Liters yellow fluid All sent for labs per MD  Pt tolerated well

## 2014-03-24 NOTE — H&P (Signed)
PCP:  Eilleen Kempf., MD   Chief Complaint:  Shortness of breath  HPI: Tammy [name] is a 58 y.o. 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   Presented with  Patient in currently on xeloda for hx of metastatic cholangiocarcinoma. Patient have been short of breath worse over past 24 hours. Her husband noted that she was short of breath and brought her to ER where a CXR was done showing right pleural effusion and patient was noted to be febrile. Family states she is confused at times. Denies easy bruising or bleeding. She have had increased lower extremity swelling. Family states her mobility has declined and now she is bed bound.  Hospitalist was called for admission for fever and pleural effusion  Review of Systems:    Pertinent positives include: Fevers,  chills, fatigue, shortness of breath at rest. Bilateral lower extremity swelling   Constitutional:  No weight loss, night sweats, weight loss  HEENT:  No headaches, Difficulty swallowing,Tooth/dental problems,Sore throat,  No sneezing, itching, ear ache, nasal congestion, post nasal drip,  Cardio-vascular:  No chest pain, Orthopnea, PND, anasarca, dizziness, palpitations.no  GI:  No heartburn, indigestion, abdominal pain, nausea, vomiting, diarrhea, change in bowel habits, loss of appetite, melena, blood in stool, hematemesis Resp:  no  No dyspnea on exertion, No excess mucus, no productive cough, No non-productive cough, No coughing up of blood.No change in color of mucus.No wheezing. Skin:  no rash or lesions. No jaundice GU:  no dysuria, change in color of urine, no urgency or frequency. No straining to urinate.  No flank pain.  Musculoskeletal:  No joint pain or no joint swelling. No decreased range of motion. No back pain.  Psych:  No change in mood or affect. No depression or anxiety. No memory  loss.  Neuro: no localizing neurological complaints, no tingling, no weakness, no double vision, no gait abnormality, no slurred speech, no confusion  Otherwise ROS are negative except for above, 10 systems were reviewed  Past Medical History: Past Medical History  Diagnosis Date  . Arthritis   . Polyp of colon 11/14/2008    seen on colonoscopy, f/u colonoscopy recommended in 5 years.   . Tumor liver 05/01/13  . 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 Hypertension    Past Surgical History  Procedure Laterality Date  . Abdominal hysterectomy  01/24/2007    including cervix, benign     Medications: Prior to Admission medications   Medication Sig Start Date End Date Taking? Authorizing Provider  capecitabine (XELODA) 500 MG tablet Take 2 tablets (1,000 mg total) by mouth 2 (two) times daily after a meal. 03/14/14  Yes Curt Bears, MD  dexamethasone (DECADRON) 4 MG tablet 2 tablet by mouth twice a day the day before, day of and day after the chemotherapy every 3 weeks. 12/13/13  Yes Curt Bears, MD  ibuprofen (ADVIL,MOTRIN) 200 MG tablet Take 400 mg by mouth every 6 (six) hours as needed for fever or moderate pain.   Yes Historical Provider, MD  lidocaine-prilocaine (EMLA) cream Apply topically as needed. Apply to port 1 hr before chemo 05/21/13  Yes Curt Bears, MD  LORazepam (ATIVAN) 0.5 MG tablet Take 1 tablet (0.5 mg total) by mouth every 8 (eight) hours as needed for anxiety. 09/26/13  Yes Curt Bears, MD  oxycodone (OXY-IR) 5 MG capsule Take 1 capsule (5 mg total) by mouth every 4 (  four) hours as needed. 03/13/14  Yes Curt Bears, MD  prochlorperazine (COMPAZINE) 10 MG tablet Take 1 tablet (10 mg total) by mouth every 6 (six) hours as needed for nausea or vomiting. 11/21/13  Yes Delfina Redwood, MD  promethazine (PHENERGAN) 25 MG tablet Take 1 tablet (25 mg total) by mouth every 8 (eight) hours as needed for nausea or vomiting. 10/03/13  Yes Curt Bears, MD     Allergies:   Allergies  Allergen Reactions  . Tramadol Nausea And Vomiting    headache    Social History:    bed bound Lives at home  With family   reports that she has never smoked. She has never used smokeless tobacco. She reports that she does not drink alcohol or use illicit drugs.    Family History: family history is not on file. She was adopted.    Physical Exam: Patient Vitals for the past 24 hrs:  BP Temp Temp src Pulse Resp SpO2 Weight  03/24/14 0116 - - - - - 99 % -  03/24/14 0115 141/86 mmHg 101.6 F (38.7 C) Rectal 133 38 93 % -  03/24/14 0054 139/90 mmHg 98.1 F (36.7 C) Oral 136 18 94 % 53.071 kg (117 lb)    1. General:  in No Acute distress 2. Psychological: Alert and   Oriented to self and situation 3. Head/ENT:     Dry Mucous Membranes                          Head Non traumatic, neck supple                          Normal   Dentition 4. SKIN: normal   Skin turgor,  Skin clean Dry and intact no rash 5. Heart: rapid Regular rate and rhythm no Murmur, Rub or gallop 6. Lungs: diminished on the right no wheezes or crackles   7. Abdomen: Soft, non-tender, slightly distended 8. Lower extremities: no clubbing, cyanosis, 2+ edema 9. Neurologically Grossly intact, moving all 4 extremities equally, esterixis present 10. MSK: Normal range of motion  body mass index is 22.85 kg/(m^2).   Labs on Admission:   Recent Labs  03/24/14 0130  NA 124*  K 4.5  CL 87*  CO2 25  GLUCOSE 147*  BUN 18  CREATININE 0.40*  CALCIUM 10.0    Recent Labs  03/24/14 0130  AST 115*  ALT 11  ALKPHOS 497*  BILITOT 4.6*  PROT 6.5  ALBUMIN 2.6*    Recent Labs  03/24/14 0130  LIPASE 16    Recent Labs  03/24/14 0130  WBC 11.2*  HGB 9.3*  HCT 26.7*  MCV 96.0  PLT 204    Recent Labs  03/24/14 0130  TROPONINI <0.30   No results found for this basename: TSH, T4TOTAL, FREET3, T3FREE, THYROIDAB,  in the last 72 hours No results found for this  basename: VITAMINB12, FOLATE, FERRITIN, TIBC, IRON, RETICCTPCT,  in the last 72 hours No results found for this basename: HGBA1C    The CrCl is unknown because both a height and weight (above a minimum accepted value) are required for this calculation. ABG No results found for this basename: phart, pco2, po2, hco3, tco2, acidbasedef, o2sat     No results found for this basename: DDIMER     Other results:  I have pearsonaly reviewed this: ECG REPORT  Rate: 129  Rhythm: sinus tachycardia  with low voltage ST&T Change: no ischemia   BNP (last 3 results)  Recent Labs  03/24/14 0130  PROBNP 823.4*    Filed Weights   03/24/14 0054  Weight: 53.071 kg (117 lb)     Cultures:    Component Value Date/Time   SDES BLOOD RIGHT HAND 11/20/2013 1325   SPECREQUEST BOTTLES DRAWN AEROBIC ONLY 4M 11/20/2013 1325   CULT  Value: NO GROWTH 5 DAYS Performed at [facility] 11/20/2013 1325   REPTSTATUS 11/26/2013 FINAL 11/20/2013 1325         Radiological Exams on Admission: Dg Chest Portable 1 View  03/24/2014   CLINICAL DATA:  Shortness of breath, metastatic cholangiocarcinoma  EXAM: PORTABLE CHEST - 1 VIEW  COMPARISON:  CT chest dated 02/18/2014  FINDINGS: Elevation of the right hemidiaphragm. Associated right basilar opacity, likely atelectasis. Right pleural effusion not excluded.  Left lung is essentially clear. Mild lingular/left lower lobe opacity, possibly atelectasis. No pneumothorax.  Cardiomegaly.  Right chest power port terminates cavoatrial junction.  IMPRESSION: Possible bilateral lower lobe atelectasis. Right pleural effusion not excluded.  Elevation of the right hemidiaphragm.   Electronically Signed   By: Julian Hy M.D.   On: 03/24/2014 01:53    Chart has been reviewed  Assessment/Plan  58 yo F with hx of Cholangiocarcinoma with extensive liver involvement resulting in liver failure.   Present on Admission:  . SIRS (systemic inflammatory response  syndrome)sourse unclear, will obtain UA, CT chest can help clarify if patient has an infiltrate or plural effusion due to liver disease, diagnostic paracentesis to evaluate for SBP, for now obtain urine and blood culture and cover with levaquin . Hyponatremia likely secondary to liver disease . Antineoplastic chemotherapy induced anemia - stable continue to monitor . Pleural effusion associated with hepatic disorder - will evaluate with CT and initiate lasix and small dose of spironolactone Cholangiocarcinoma - advaced disease in patient with worsening performance status, would notify oncology in AM  Over all poor prognosis    Prophylaxis: SCD   CODE STATUS:  FULL CODE as per patient's wishes at this point, goals of care should be addressed given poor prongnosis  Other plan as per orders.  I have spent a total of 55 min on this admission  Saniyah Mondesir 03/24/2014, 3:25 AM

## 2014-03-24 NOTE — ED Notes (Signed)
Per husband pt began having difficulty breathing onset 2200, pt is taking PO chemo for Liver CA. Pt is not following commands well.

## 2014-03-24 NOTE — ED Provider Notes (Signed)
CSN: [id number]     Arrival date & time 03/24/14  0050 History   First MD Initiated Contact with Patient 03/24/14 0111     Chief Complaint  Patient presents with  . Shortness of Breath  . Altered Mental Status     (Consider location/radiation/quality/duration/timing/severity/associated sxs/prior Treatment) Patient is a 58 y.o. female presenting with shortness of breath and altered mental status.  Shortness of Breath Associated symptoms: chest pain   Associated symptoms: no abdominal pain, no fever, no headaches, no rash and no vomiting   Altered Mental Status Associated symptoms: no abdominal pain, no fever, no headaches, no rash and no vomiting    History provided by patient and her husband bedside who helps translate. Patient is primarily Spanish-speaking. She has history of liver cancer, followed by heme onc. She's been having worsening dyspnea over the last 3 days. She denies any cough or productive sputum. She has had some intermittent sharp substernal chest pain and also some right shoulder pain, worse with movement. She has had some bilateral lower extremity swelling. Tonight with her symptoms feels febrile. Symptoms moderate in severity. She took an oxycodone prior to arrival and currently denies any pain.  Past Medical History  Diagnosis Date  . Arthritis   . Polyp of colon 11/14/2008    seen on colonoscopy, f/u colonoscopy recommended in 5 years.   . Tumor liver 05/01/13   Past Surgical History  Procedure Laterality Date  . Abdominal hysterectomy  01/24/2007    including cervix, benign   No family history on file. History  Substance Use Topics  . Smoking status: Never Smoker   . Smokeless tobacco: Never Used  . Alcohol Use: No   OB History   Grav Para Term Preterm Abortions TAB SAB Ect Mult Living                 Review of Systems  Constitutional: Negative for fever and chills.  Eyes: Negative for visual disturbance.  Respiratory: Positive for shortness of breath.    Cardiovascular: Positive for chest pain.  Gastrointestinal: Negative for vomiting and abdominal pain.  Genitourinary: Negative for dysuria.  Musculoskeletal: Negative for back pain and neck stiffness.  Skin: Negative for rash.  Neurological: Negative for headaches.  All other systems reviewed and are negative.     Allergies  Tramadol  Home Medications   Prior to Admission medications   Medication Sig Start Date End Date Taking? Authorizing Provider  capecitabine (XELODA) 500 MG tablet Take 2 tablets (1,000 mg total) by mouth 2 (two) times daily after a meal. 03/14/14   Curt Bears, MD  dexamethasone (DECADRON) 4 MG tablet 2 tablet by mouth twice a day the day before, day of and day after the chemotherapy every 3 weeks. 12/13/13   Curt Bears, MD  ibuprofen (ADVIL,MOTRIN) 200 MG tablet Take 400 mg by mouth every 6 (six) hours as needed for fever or moderate pain.    Historical Provider, MD  lidocaine-prilocaine (EMLA) cream Apply topically as needed. Apply to port 1 hr before chemo 05/21/13   Curt Bears, MD  LORazepam (ATIVAN) 0.5 MG tablet Take 1 tablet (0.5 mg total) by mouth every 8 (eight) hours as needed for anxiety. 09/26/13   Curt Bears, MD  oxycodone (OXY-IR) 5 MG capsule Take 1 capsule (5 mg total) by mouth every 4 (four) hours as needed. 03/13/14   Curt Bears, MD  prochlorperazine (COMPAZINE) 10 MG tablet Take 1 tablet (10 mg total) by mouth every 6 (six) hours as  needed for nausea or vomiting. 11/21/13   Delfina Redwood, MD  promethazine (PHENERGAN) 25 MG tablet Take 1 tablet (25 mg total) by mouth every 8 (eight) hours as needed for nausea or vomiting. 10/03/13   Curt Bears, MD   BP 141/86  Pulse 133  Temp(Src) 101.6 F (38.7 C) (Rectal)  Resp 38  Wt 117 lb (53.071 kg)  SpO2 99% Physical Exam  Constitutional: She is oriented to person, place, and time. She appears well-developed and well-nourished.  HENT:  Head: Normocephalic and  atraumatic.  Eyes: EOM are normal. Pupils are equal, round, and reactive to light. Scleral icterus is present.  Neck: Neck supple.  Cardiovascular: Regular rhythm and intact distal pulses.   Tachycardic  Pulmonary/Chest: Effort normal and breath sounds normal. No respiratory distress.  Abdominal: Soft. Bowel sounds are normal. She exhibits no distension. There is no tenderness.  Musculoskeletal: Normal range of motion.  2+ pitting lower extremity edema  Neurological: She is alert and oriented to person, place, and time. No cranial nerve deficit.  Skin: Skin is warm and dry.  Jaundiced    ED Course  Procedures (including critical care time) Labs Review Labs Reviewed  CBC - Abnormal; Notable for the following:    WBC 11.2 (*)    RBC 2.78 (*)    Hemoglobin 9.3 (*)    HCT 26.7 (*)    RDW 22.2 (*)    All other components within normal limits  COMPREHENSIVE METABOLIC PANEL - Abnormal; Notable for the following:    Sodium 124 (*)    Chloride 87 (*)    Glucose, Bld 147 (*)    Creatinine, Ser 0.40 (*)    Albumin 2.6 (*)    AST 115 (*)    Alkaline Phosphatase 497 (*)    Total Bilirubin 4.6 (*)    All other components within normal limits  PRO B NATRIURETIC PEPTIDE - Abnormal; Notable for the following:    Pro B Natriuretic peptide (BNP) 823.4 (*)    All other components within normal limits  URINALYSIS, ROUTINE W REFLEX MICROSCOPIC - Abnormal; Notable for the following:    Color, Urine AMBER (*)    APPearance CLOUDY (*)    Bilirubin Urine SMALL (*)    Protein, ur 30 (*)    All other components within normal limits  PROTIME-INR - Abnormal; Notable for the following:    Prothrombin Time 17.9 (*)    INR 1.52 (*)    All other components within normal limits  URINE MICROSCOPIC-ADD ON - Abnormal; Notable for the following:    Casts HYALINE CASTS (*)    Crystals CA OXALATE CRYSTALS (*)    All other components within normal limits  CULTURE, BLOOD (ROUTINE X 2)  CULTURE, BLOOD  (ROUTINE X 2)  LIPASE, BLOOD  TROPONIN I  AMMONIA  MAGNESIUM  PHOSPHORUS  TSH  COMPREHENSIVE METABOLIC PANEL  CBC  OCCULT BLOOD X 1 CARD TO LAB, STOOL  CREATININE, URINE, RANDOM  SODIUM, URINE, RANDOM    Imaging Review Ct Head Wo Contrast  03/24/2014   CLINICAL DATA:  Liver cancer with shortness of breath. Altered mental status.  EXAM: CT HEAD WITHOUT CONTRAST  TECHNIQUE: Contiguous axial images were obtained from the base of the skull through the vertex without intravenous contrast.  COMPARISON:  DG CERVICAL SPINE COMPLETE dated 11/28/2012  FINDINGS: Ventricles and sulci appear symmetrical with mild cerebral atrophy. No ventricular dilatation. Note that MRI would be more sensitive for detection of intracranial metastases. No mass  effect or midline shift. No abnormal extra-axial fluid collections. Gray-white matter junctions are distinct. Basal cisterns are not effaced. No evidence of acute intracranial hemorrhage. No depressed skull fractures. Visualized paranasal sinuses and mastoid air cells are not opacified. Intracranial calcifications demonstrated on the left. These are likely subdural although surface intraparenchymal calcifications are not excluded.  IMPRESSION: No acute intracranial abnormalities. Intracranial calcifications are likely dystrophic or postinflammatory.   Electronically Signed   By: Lucienne Capers M.D.   On: 03/24/2014 04:12   Dg Chest Portable 1 View  03/24/2014   CLINICAL DATA:  Shortness of breath, metastatic cholangiocarcinoma  EXAM: PORTABLE CHEST - 1 VIEW  COMPARISON:  CT chest dated 02/18/2014  FINDINGS: Elevation of the right hemidiaphragm. Associated right basilar opacity, likely atelectasis. Right pleural effusion not excluded.  Left lung is essentially clear. Mild lingular/left lower lobe opacity, possibly atelectasis. No pneumothorax.  Cardiomegaly.  Right chest power port terminates cavoatrial junction.  IMPRESSION: Possible bilateral lower lobe atelectasis. Right  pleural effusion not excluded.  Elevation of the right hemidiaphragm.   Electronically Signed   By: Julian Hy M.D.   On: 03/24/2014 01:53     EKG Interpretation   Date/Time:  Sunday Mar 24 2014 01:32:22 EDT Ventricular Rate:  129 PR Interval:  136 QRS Duration: 84 QT Interval:  296 QTC Calculation: 434 R Axis:   145 Text Interpretation:  Sinus tachycardia Left posterior fascicular block  Low voltage, extremity leads Nonspecific ST abnormality No old tracing to  compare Confirmed by Isiaha Greenup  MD, Economy ([id number]) on 03/24/2014 1:59:22 AM     IV fluids. IV antibiotics. Motrin for fever. D/w Dr Roel Cluck, will evaluate for admit  MDM   Diagnosis: Right pleural effusion, chest pain, fever, hyponatremia  Adult female with history of liver cancer presents with fever, dyspnea and tachycardia. Chest x-ray shows large right pleural effusion. Sinus tachycardia review of EKG. Labs demonstrate low sodium, anemia, elevated bilirubin, elevated INR. Patient treated with fluids and IV Levaquin. MED admit    Teressa Lower, MD 03/24/14 [id number]

## 2014-03-25 ENCOUNTER — Other Ambulatory Visit: Payer: Self-pay | Admitting: Internal Medicine

## 2014-03-25 DIAGNOSIS — J90 Pleural effusion, not elsewhere classified: Secondary | ICD-10-CM

## 2014-03-25 DIAGNOSIS — T451X5A Adverse effect of antineoplastic and immunosuppressive drugs, initial encounter: Secondary | ICD-10-CM

## 2014-03-25 DIAGNOSIS — R109 Unspecified abdominal pain: Secondary | ICD-10-CM

## 2014-03-25 DIAGNOSIS — D6481 Anemia due to antineoplastic chemotherapy: Secondary | ICD-10-CM

## 2014-03-25 LAB — PROTIME-INR
INR: 1.64 — ABNORMAL HIGH (ref 0.00–1.49)
Prothrombin Time: 19 seconds — ABNORMAL HIGH (ref 11.6–15.2)

## 2014-03-25 LAB — BASIC METABOLIC PANEL
BUN: 17 mg/dL (ref 6–23)
CO2: 27 mEq/L (ref 19–32)
CREATININE: 0.43 mg/dL — AB (ref 0.50–1.10)
Calcium: 9.8 mg/dL (ref 8.4–10.5)
Chloride: 87 mEq/L — ABNORMAL LOW (ref 96–112)
GFR calc non Af Amer: >90 mL/min (ref >90)
Glucose, Bld: 123 mg/dL — ABNORMAL HIGH (ref 70–99)
Potassium: 4 mEq/L (ref 3.7–5.3)
Sodium: 122 mEq/L — ABNORMAL LOW (ref 137–147)

## 2014-03-25 LAB — SODIUM, URINE, RANDOM: SODIUM UR: 58 meq/L

## 2014-03-25 LAB — CBC
HCT: 26.5 % — ABNORMAL LOW (ref 36.0–46.0)
Hemoglobin: 8.9 g/dL — ABNORMAL LOW (ref 12.0–15.0)
MCH: 33.1 pg (ref 26.0–34.0)
MCHC: 33.6 g/dL (ref 30.0–36.0)
MCV: 98.5 fL (ref 78.0–100.0)
PLATELETS: 177 10*3/uL (ref 150–400)
RBC: 2.69 MIL/uL — ABNORMAL LOW (ref 3.87–5.11)
RDW: 23.1 % — AB (ref 11.5–15.5)
WBC: 8.8 10*3/uL (ref 4.0–10.5)

## 2014-03-25 LAB — PATHOLOGIST SMEAR REVIEW

## 2014-03-25 LAB — CREATININE, URINE, RANDOM: CREATININE, URINE: 39.8 mg/dL

## 2014-03-25 NOTE — Consult Note (Signed)
[place]  [phone]   Requesting Provider: [facility]  Consulting Provider: Dr. 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 No bleeding issues were noted. In addition, she had increased bilateral lower extremity edema.Patient was on Xeloda from 4/24 till 5/2, the day of admission.  A CXR revealed right pleural effusion. She also underwent  US guided paracentesis of the RLQ on 5/3  Yielding 2 l of yellow fluid with improvement of her respiratory symptoms, Cell counts/culture.  CT of the head negative for intracranial abnormalities.A CT of the chest was ordered, results pending.  We have been requested to see the patient in view of her overall clinical status, with no apparent response to Xeloda.   Oncological History:   PRIOR THERAPY:   1) Systemic chemotherapy with cisplatin 100 mg/M2 on day 1 and gemcitabine 800 mg/M2 on days 1 and 8 every 3 weeks. First cycle on 05/23/2013. Status post 9 cycles. Cisplatin was reduced to 60 mg/M2 starting from cycle #8.  2) Docetaxel 75 mg/M2 every 3 weeks with Neulasta support, status post 3 cycles. First cycle 12/19/2013   CURRENT THERAPY: Xeloda 625 mg/M2 twice a day for 14 days every 3 weeks. First dose started 02/22/2014 .      Past Medical History  Diagnosis Date  . Arthritis   . Polyp of colon 11/14/2008    seen on colonoscopy, f/u colonoscopy recommended in 5 years.   . Tumor liver 05/01/13  . 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 Hypertension      MEDICATIONS:  Scheduled Meds: . docusate sodium  100 mg Oral BID  . furosemide  20 mg Oral BID  .  levofloxacin (LEVAQUIN) IV  750 mg Intravenous Q24H  . sodium chloride  3 mL Intravenous Q12H  . spironolactone  12.5 mg Oral Daily   Continuous Infusions:  PRN Meds:.acetaminophen, acetaminophen, ondansetron (ZOFRAN) IV, ondansetron, oxyCODONE, sodium chloride  ALLERGIES:  Allergies  Allergen Reactions  . Tramadol Nausea And Vomiting    headache    Family History  Problem Relation Age of Onset  . Adopted: Yes     Past Surgical History  Procedure Laterality Date  . Abdominal hysterectomy  01/24/2007    including cervix, benign   ROS: see HPI for significant positives. No weight loss or appetite changes. Rest of the review of systems negative.    History   Social History  . Marital Status: Married    Spouse Name: N/A    Number of Children: 1  . Years of Education: N/A   Occupational History  . Waitress      [employer]   Social History Main Topics  . Smoking status: Never Smoker   . Smokeless tobacco: Never Used  . Alcohol Use: No  . Drug Use: No  . Sexual Activity: Yes    Birth Control/ Protection: Surgical   Other Topics Concern  . Not on file   Social History Narrative   Lives with husband.    Has 57 yo daughter.   Expecting first grandson Harrell Gave)   Zumba for exercise      PHYSICAL EXAMINATION:   Filed Vitals:   03/25/14 0638  BP:  106/86  Pulse: 98  Temp: 98.3 F (36.8 C)  Resp: 20   Filed Weights   03/24/14 0054 03/24/14 0540 03/25/14 0272  Weight: 117 lb (53.071 kg) 129 lb 3 oz (58.6 kg) 129 lb 6.6 oz (58.49 kg)    58 year old in no acute distress,conversant, alert and oriented to time, place and date.  General well-developed and thin, ill appearing HEENT: Normocephalic, atraumatic. Temporal wasting.Sclera icteric.Oral cavity without thrush or lesions. Neck:  Supple. No thyromegaly,no cervical or supraclavicular adenopathy  Lungs: Clear to auscultation. No wheezing, rhonchi or rales. Cardiac: Regular rate and rhythm, no murmur,rubs or  gallops Abdomen: Soft, tender at the lower quadrants,bowel sounds x4.  Extremities: No clubbing cyanosis or edema. No petechial rash. Jaundice noted. Neuro: No focal or motor deficits  LABORATORY/RADIOLOGY DATA:   Recent Labs Lab 03/24/14 0130 03/24/14 0840 03/25/14 0500  WBC 11.2* 7.8 8.8  HGB 9.3* 8.8* 8.9*  HCT 26.7* 25.5* 26.5*  PLT 204 154 177  MCV 96.0 97.7 98.5  MCH 33.5 33.7 33.1  MCHC 34.8 34.5 33.6  RDW 22.2* 22.1* 23.1*    CMP    Recent Labs Lab 03/24/14 0130 03/24/14 0840 03/25/14 0500  NA 124* 126* 122*  K 4.5 4.3 4.0  CL 87* 90* 87*  CO2 25 26 27   GLUCOSE 147* 118* 123*  BUN 18 17 17   CREATININE 0.40* 0.37* 0.43*  CALCIUM 10.0 9.5 9.8  MG  --  1.6  --   AST 115* 101*  --   ALT 11 10  --   ALKPHOS 497* 424*  --   BILITOT 4.6* 5.0*  --         Component Value Date/Time   BILITOT 5.0* 03/24/2014 0840   BILITOT 2.56* 03/13/2014 0840      Recent Labs  03/24/14 0840  TSH 4.320     No results found for this basename: esrsedrate     Recent Labs Lab 03/24/14 0415 03/25/14 0500  INR 1.52* 1.64*      Urinalysis    Component Value Date/Time   COLORURINE AMBER* 03/24/2014 0440   APPEARANCEUR CLOUDY* 03/24/2014 0440   LABSPEC 1.023 03/24/2014 0440   PHURINE 5.5 03/24/2014 0440   GLUCOSEU NEGATIVE 03/24/2014 0440   HGBUR NEGATIVE 03/24/2014 0440   BILIRUBINUR SMALL* 03/24/2014 0440   KETONESUR NEGATIVE 03/24/2014 0440   PROTEINUR 30* 03/24/2014 0440   UROBILINOGEN 1.0 03/24/2014 0440   NITRITE NEGATIVE 03/24/2014 0440   LEUKOCYTESUR NEGATIVE 03/24/2014 0440    Drugs of Abuse  No results found for this basename: labopia, cocainscrnur, labbenz, amphetmu, thcu, labbarb     Liver Function Tests:  Recent Labs Lab 03/24/14 0130 03/24/14 0840  AST 115* 101*  ALT 11 10  ALKPHOS 497* 424*  BILITOT 4.6* 5.0*  PROT 6.5 5.7*  ALBUMIN 2.6* 2.2*    Recent Labs Lab 03/24/14 0130  LIPASE 16    Recent Labs Lab 03/24/14 0415  AMMONIA 37     Cardiac Enzymes:  Recent Labs Lab 03/24/14 0130  TROPONINI <0.30     Thyroid function studies  Recent Labs  03/24/14 0840  TSH 4.320    Radiology Studies:  Ct Head Wo Contrast  03/24/2014   FINDINGS: Ventricles and sulci appear symmetrical with mild cerebral atrophy. No ventricular dilatation. Note that MRI would be more sensitive for detection of intracranial metastases. No mass effect or midline shift. No abnormal extra-axial fluid collections. Gray-white matter junctions are distinct. Basal cisterns are not effaced. No evidence of acute  intracranial hemorrhage. No depressed skull fractures. Visualized paranasal sinuses and mastoid air cells are not opacified. Intracranial calcifications demonstrated on the left. These are likely subdural although surface intraparenchymal calcifications are not excluded.  IMPRESSION: No acute intracranial abnormalities. Intracranial calcifications are likely dystrophic or postinflammatory.   Electronically Signed   By: Lucienne Capers M.D.   On: 03/24/2014 04:12   Dg Chest Portable 1 View  03/24/2014   FiNDINGS: Elevation of the right hemidiaphragm. Associated right basilar opacity, likely atelectasis. Right pleural effusion not excluded.  Left lung is essentially clear. Mild lingular/left lower lobe opacity, possibly atelectasis. No pneumothorax.  Cardiomegaly.  Right chest power port terminates cavoatrial junction.  IMPRESSION: Possible bilateral lower lobe atelectasis. Right pleural effusion not excluded.  Elevation of the right hemidiaphragm.   Electronically Signed   By: Julian Hy M.D.   On: 03/24/2014 01:53       ASSESSMENT AND PLAN:  18. 58 years old Hispanic female with metastatic cholangiocarcinoma s/p multiple chemo regimens discontinued after disease progression, now on  Xeloda 650 mg/M2 twice a day. Cycle 2 began on 4/22 with last dose on 5/2. Progression of disease present. She had ascites, requiring paracentesis with  cytology pending, suspected malignant. CT of the chest pending. Xeloda on hold.  LIkely to require Hospice involvement if therapy options were to be exhausted. Dr. Julien Nordmann to write an addendum.   2. SIRS (systemic inflammatory response syndrome) CT chest pending. On Levaquin. Blood cultures pending.   3. Anemia  in the setting of chemo and chronic disease. No bleeding issues. Monitor.  4. Pleural effusion  On lasix and small dose of spironolactone. CT chest pendign.  5. Full Code.       **Disclaimer: This note was dictated with voice recognition software. Similar sounding words can inadvertently be transcribed and this note may contain transcription errors which may not have been corrected upon publication of note.Rondel Jumbo, PA-C 03/25/2014, 9:20 AM  ADDENDUM: Hematology/Oncology Attending: The patient is seen and examined today. I agree with the above note. She is a very pleasant 58 years old Hispanic female who was diagnosed with metastatic cholangiocarcinoma status post several chemotherapy regimens including treatment with cisplatin and gemcitabine followed by single agent docetaxel and currently on oral Xeloda for the last 6 weeks. Unfortunately her condition is deteriorating rapidly with worsening jaundice as well as fatigue and weakness. She was admitted yesterday with fever and worsening dyspnea. Chest x-ray showed right pleural effusion. She underwent ultrasound-guided paracentesis yesterday but the final report from this procedure is still pending.  I have a lengthy discussion with the patient and her husband today about her current condition. Unfortunately the patient has very poor prognosis and I strongly recommended for them to consider palliative care and hospice at this point. The patient and her husband are in agreement with the current plan. I would consider discontinuing her current treatment with Xeloda at this point. We will continue supportive care for this nice  lady. Thank you so much for taking good care of Ms. Tammy [name]. I will continue to follow the patient with you and assist in her management an as-needed basis.

## 2014-03-25 NOTE — Progress Notes (Signed)
CARE MANAGEMENT NOTE 03/25/2014  Patient:  KALISI, BEVILL   Account Number:  [id number]  Date Initiated:  03/25/2014  Documentation initiated by:  [facility]  Subjective/Objective Assessment:   58 Y/O F ADMITTED W/ESSENTIAL HYN.HX:MET COLON CHOLANGIO CA.     Action/Plan:   FROM HOME W/SPOUSE.SPANISH SPEAKING.   Anticipated DC Date:  03/28/2014   Anticipated DC Plan:  [place]  In-house referral  Interpreting Services      DC Planning Services  CM consult      Choice offered to / List presented to:             Status of service:  In process, will continue to follow Medicare Important Message given?   (If response is "NO", the following Medicare IM given date fields will be blank) Date Medicare IM given:   Date Additional Medicare IM given:    Discharge Disposition:    Per UR Regulation:  Reviewed for med. necessity/level of care/duration of stay  If discussed at [place] of Stay Meetings, dates discussed:    Comments:  03/25/14 Romaine Maciolek RN,BSN NCM [phone] TELEPHONIC [place] [facility]HB[facility] W/C,& RW.WOULD RECOMMEND PT/OT CONS.S/P PARACENTESIS.IV ABX.

## 2014-03-25 NOTE — Progress Notes (Signed)
Patient ID: Tammy [name]  female  XBD:[id number]    DOB: 03-04-1956    DOA: 03/24/2014  PCP: Eilleen Kempf., MD  Assessment/Plan: Active Problems:   Essential hypertension, benign   Hyponatremia   Antineoplastic chemotherapy induced anemia   Fever   Pleural effusion associated with hepatic disorder   SIRS (systemic inflammatory response syndrome)  Fever/SIRS  -etiology unclear, CXR with possible atelectasis - CT chest 2 showed enlargement of prevascular lymph node concerning for mediastinal metastasis, significant progression of hepatic metastasis, several new lesions, evidence of portal hypertension -continue levaquin for now  -US guided paracentesis done with 2 L removed to r/o SBP, follow cultures - Blood CX  negative so far   Hyponatremia  worsened to 122  -hypervolemic/third spacing clinically, hold Lasix today   -continue  spironolactone   Abnormal LFTS  -likely due to liver mets   Anemia  -due to chronic disease/chemo   Metastatic cholangiocarcinoma progressively worsened on the CT - hold Xeloda at this time, oncology consulted   DVT Prophylaxis:  Code Status:  Family Communication: discussed with patient and her family member at the bedside   Disposition:  Consultants:   oncology, Dr. Julien Nordmann  Procedures:   ultrasound-guided paracentesis  Antibiotics:   levofloxacin     Subjective:  no fevers overnight, feeling weak, nausea, deconditioned  Objective: Weight change: 5.629 kg (12 lb 6.6 oz)  Intake/Output Summary (Last 24 hours) at 03/25/14 1225 Last data filed at 03/25/14 0900  Gross per 24 hour  Intake    600 ml  Output      0 ml  Net    600 ml   Blood pressure 106/86, pulse 98, temperature 98.3 F (36.8 C), temperature source Oral, resp. rate 20, height 5' (1.524 m), weight 58.7 kg (129 lb 6.6 oz), SpO2 97.00%.  Physical Exam: General: Alert and awake, oriented x3, cachectic, sickly appearing  CVS: S1-S2 clear, no murmur rubs  or gallops Chest: clear to auscultation bilaterally, no wheezing, rales or rhonchi Abdomen: soft nontender,  Mildly distended, normal bowel sounds  Extremities: no cyanosis, clubbing  Neuro: Cranial nerves II-XII intact, no focal neurological deficits  Lab Results: Basic Metabolic Panel:  Recent Labs Lab 03/24/14 0840 03/25/14 0500  NA 126* 122*  K 4.3 4.0  CL 90* 87*  CO2 26 27  GLUCOSE 118* 123*  BUN 17 17  CREATININE 0.37* 0.43*  CALCIUM 9.5 9.8  MG 1.6  --   PHOS 2.1*  --    Liver Function Tests:  Recent Labs Lab 03/24/14 0130 03/24/14 0840  AST 115* 101*  ALT 11 10  ALKPHOS 497* 424*  BILITOT 4.6* 5.0*  PROT 6.5 5.7*  ALBUMIN 2.6* 2.2*    Recent Labs Lab 03/24/14 0130  LIPASE 16    Recent Labs Lab 03/24/14 0415  AMMONIA 37   CBC:  Recent Labs Lab 03/24/14 0840 03/25/14 0500  WBC 7.8 8.8  HGB 8.8* 8.9*  HCT 25.5* 26.5*  MCV 97.7 98.5  PLT 154 177   Cardiac Enzymes:  Recent Labs Lab 03/24/14 0130  TROPONINI <0.30   BNP: No components found with this basename: POCBNP,  CBG: No results found for this basename: GLUCAP,  in the last 168 hours   Micro Results: Recent Results (from the past 240 hour(s))  CULTURE, BLOOD (ROUTINE X 2)     Status: None   Collection Time    03/24/14  4:15 AM      Result Value Ref Range Status  Specimen Description BLOOD LEFT ARM   Final   Special Requests BOTTLES DRAWN AEROBIC AND ANAEROBIC [facility] EACH   Final   Culture  Setup Time     Final   Value: 03/24/2014 11:54     Performed at [facility]   Culture     Final   Value:        BLOOD CULTURE RECEIVED NO GROWTH TO DATE CULTURE WILL BE HELD FOR 5 DAYS BEFORE ISSUING A FINAL NEGATIVE REPORT     Performed at [facility]   Report Status PENDING   Incomplete  CULTURE, BLOOD (ROUTINE X 2)     Status: None   Collection Time    03/24/14  4:20 AM      Result Value Ref Range Status   Specimen Description BLOOD LEFT HAND   Final   Special  Requests BOTTLES DRAWN AEROBIC AND ANAEROBIC [facility] EACH   Final   Culture  Setup Time     Final   Value: 03/24/2014 11:54     Performed at [facility]   Culture     Final   Value:        BLOOD CULTURE RECEIVED NO GROWTH TO DATE CULTURE WILL BE HELD FOR 5 DAYS BEFORE ISSUING A FINAL NEGATIVE REPORT     Performed at [facility]   Report Status PENDING   Incomplete  BODY FLUID CULTURE     Status: None   Collection Time    03/24/14 12:04 PM      Result Value Ref Range Status   Specimen Description PERITONEAL   Final   Special Requests Immunocompromised   Final   Gram Stain     Final   Value: WBC PRESENT,BOTH PMN AND MONONUCLEAR     NO ORGANISMS SEEN     Performed at [facility]   Culture     Final   Value: NO GROWTH 1 DAY     Performed at [facility]   Report Status PENDING   Incomplete    Studies/Results: Ct Head Wo Contrast  03/24/2014   CLINICAL DATA:  Liver cancer with shortness of breath. Altered mental status.  EXAM: CT HEAD WITHOUT CONTRAST  TECHNIQUE: Contiguous axial images were obtained from the base of the skull through the vertex without intravenous contrast.  COMPARISON:  DG CERVICAL SPINE COMPLETE dated 11/28/2012  FINDINGS: Ventricles and sulci appear symmetrical with mild cerebral atrophy. No ventricular dilatation. Note that MRI would be more sensitive for detection of intracranial metastases. No mass effect or midline shift. No abnormal extra-axial fluid collections. Gray-white matter junctions are distinct. Basal cisterns are not effaced. No evidence of acute intracranial hemorrhage. No depressed skull fractures. Visualized paranasal sinuses and mastoid air cells are not opacified. Intracranial calcifications demonstrated on the left. These are likely subdural although surface intraparenchymal calcifications are not excluded.  IMPRESSION: No acute intracranial abnormalities. Intracranial calcifications are likely dystrophic or postinflammatory.    Electronically Signed   By: Lucienne Capers M.D.   On: 03/24/2014 04:12   Dg Chest Portable 1 View  03/24/2014   CLINICAL DATA:  Shortness of breath, metastatic cholangiocarcinoma  EXAM: PORTABLE CHEST - 1 VIEW  COMPARISON:  CT chest dated 02/18/2014  FINDINGS: Elevation of the right hemidiaphragm. Associated right basilar opacity, likely atelectasis. Right pleural effusion not excluded.  Left lung is essentially clear. Mild lingular/left lower lobe opacity, possibly atelectasis. No pneumothorax.  Cardiomegaly.  Right chest power port terminates cavoatrial junction.  IMPRESSION: Possible bilateral lower lobe atelectasis. Right pleural effusion not excluded.  Elevation of the right hemidiaphragm.   Electronically Signed   By: Julian Hy M.D.   On: 03/24/2014 01:53    Medications: Scheduled Meds: . docusate sodium  100 mg Oral BID  . levofloxacin (LEVAQUIN) IV  750 mg Intravenous Q24H  . sodium chloride  3 mL Intravenous Q12H  . spironolactone  12.5 mg Oral Daily      LOS: 1 day   Ferd Horrigan Krystal Eaton M.D. [facility] 03/25/2014, 12:25 PM Pager: [phone]  If 7PM-7AM, please contact night-coverage [email or web address] Password [email or web address]  **Disclaimer: This note was dictated with voice recognition software. Similar sounding words can inadvertently be transcribed and this note may contain transcription errors which may not have been corrected upon publication of note.**

## 2014-03-26 LAB — BASIC METABOLIC PANEL
BUN: 19 mg/dL (ref 6–23)
CALCIUM: 10 mg/dL (ref 8.4–10.5)
CO2: 25 mEq/L (ref 19–32)
Chloride: 87 mEq/L — ABNORMAL LOW (ref 96–112)
Creatinine, Ser: 0.46 mg/dL — ABNORMAL LOW (ref 0.50–1.10)
GFR calc Af Amer: >90 mL/min (ref >90)
GFR calc non Af Amer: >90 mL/min (ref >90)
GLUCOSE: 191 mg/dL — AB (ref 70–99)
Potassium: 4.1 mEq/L (ref 3.7–5.3)
SODIUM: 122 meq/L — AB (ref 137–147)

## 2014-03-26 NOTE — Progress Notes (Signed)
Patient ID: Tammy [name]  female  ZOX:[id number]    DOB: 1956-08-03    DOA: 03/24/2014  PCP: Eilleen Kempf., MD  Assessment/Plan: Active Problems:   Essential hypertension, benign   Hyponatremia   Antineoplastic chemotherapy induced anemia   Fever   Pleural effusion associated with hepatic disorder   SIRS (systemic inflammatory response syndrome)  Fever/SIRS - improved -etiology unclear, CXR with possible atelectasis - CT chest 2 showed enlargement of prevascular lymph node concerning for mediastinal metastasis, significant progression of hepatic metastasis, several new lesions, evidence of portal hypertension -continue levaquin for now  -US guided paracentesis done with 2 L removed to r/o SBP, cultures negative so far - Blood CX  negative so far   Hyponatremia  worsened to 122 yesterday, also likely due to metastatic cholangiocarcinoma -hypervolemic/third spacing clinically, Lasix held, will recheck BMET again today -continue  spironolactone   Abnormal LFTS  -likely due to liver mets   Anemia  -due to chronic disease/chemo   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 Julien Nordmann, consider discontinuing the current chemotherapy, prognosis at this time and recommended palliative care. Consult placed.    DVT Prophylaxis:  Code Status:  Family Communication: discussed with patient and her family member at the bedside.   Disposition:  Consultants:   oncology, Dr. Julien Nordmann  Procedures:   ultrasound-guided paracentesis  Antibiotics:   levofloxacin     Subjective:  no fevers overnight, deconditioned, + abdominal pain  Objective: Weight change: -0.4 kg (-14.1 oz)  Intake/Output Summary (Last 24 hours) at 03/26/14 1242 Last data filed at 03/26/14 0900  Gross per 24 hour  Intake    540 ml  Output    230 ml  Net    310 ml   Blood pressure 120/81, pulse 110, temperature 97.8 F (36.6  C), temperature source Oral, resp. rate 24, height 5' (1.524 m), weight 58.3 kg (128 lb 8.5 oz), SpO2 97.00%.  Physical Exam: General: Alert and awake, oriented x3, cachectic, sickly appearing  CVS: S1-S2 clear, no murmur rubs or gallops Chest: dec BS at bases Abdomen: soft nontender, Mildly distended, normal bowel sounds  Extremities: no cyanosis, clubbing    Lab Results: Basic Metabolic Panel:  Recent Labs Lab 03/24/14 0840 03/25/14 0500  NA 126* 122*  K 4.3 4.0  CL 90* 87*  CO2 26 27  GLUCOSE 118* 123*  BUN 17 17  CREATININE 0.37* 0.43*  CALCIUM 9.5 9.8  MG 1.6  --   PHOS 2.1*  --    Liver Function Tests:  Recent Labs Lab 03/24/14 0130 03/24/14 0840  AST 115* 101*  ALT 11 10  ALKPHOS 497* 424*  BILITOT 4.6* 5.0*  PROT 6.5 5.7*  ALBUMIN 2.6* 2.2*    Recent Labs Lab 03/24/14 0130  LIPASE 16    Recent Labs Lab 03/24/14 0415  AMMONIA 37   CBC:  Recent Labs Lab 03/24/14 0840 03/25/14 0500  WBC 7.8 8.8  HGB 8.8* 8.9*  HCT 25.5* 26.5*  MCV 97.7 98.5  PLT 154 177   Cardiac Enzymes:  Recent Labs Lab 03/24/14 0130  TROPONINI <0.30   BNP: No components found with this basename: POCBNP,  CBG: No results found for this basename: GLUCAP,  in the last 168 hours   Micro Results: Recent Results (from the past 240 hour(s))  CULTURE, BLOOD (ROUTINE X 2)     Status: None   Collection Time    03/24/14  4:15  AM      Result Value Ref Range Status   Specimen Description BLOOD LEFT ARM   Final   Special Requests BOTTLES DRAWN AEROBIC AND ANAEROBIC [facility] EACH   Final   Culture  Setup Time     Final   Value: 03/24/2014 11:54     Performed at [facility]   Culture     Final   Value:        BLOOD CULTURE RECEIVED NO GROWTH TO DATE CULTURE WILL BE HELD FOR 5 DAYS BEFORE ISSUING A FINAL NEGATIVE REPORT     Performed at [facility]   Report Status PENDING   Incomplete  CULTURE, BLOOD (ROUTINE X 2)     Status: None   Collection Time     03/24/14  4:20 AM      Result Value Ref Range Status   Specimen Description BLOOD LEFT HAND   Final   Special Requests BOTTLES DRAWN AEROBIC AND ANAEROBIC [facility] EACH   Final   Culture  Setup Time     Final   Value: 03/24/2014 11:54     Performed at [facility]   Culture     Final   Value:        BLOOD CULTURE RECEIVED NO GROWTH TO DATE CULTURE WILL BE HELD FOR 5 DAYS BEFORE ISSUING A FINAL NEGATIVE REPORT     Performed at [facility]   Report Status PENDING   Incomplete  BODY FLUID CULTURE     Status: None   Collection Time    03/24/14 12:04 PM      Result Value Ref Range Status   Specimen Description PERITONEAL   Final   Special Requests Immunocompromised   Final   Gram Stain     Final   Value: WBC PRESENT,BOTH PMN AND MONONUCLEAR     NO ORGANISMS SEEN     Performed at [facility]   Culture     Final   Value: NO GROWTH 2 DAYS     Performed at [facility]   Report Status PENDING   Incomplete    Studies/Results: Ct Head Wo Contrast  03/24/2014   CLINICAL DATA:  Liver cancer with shortness of breath. Altered mental status.  EXAM: CT HEAD WITHOUT CONTRAST  TECHNIQUE: Contiguous axial images were obtained from the base of the skull through the vertex without intravenous contrast.  COMPARISON:  DG CERVICAL SPINE COMPLETE dated 11/28/2012  FINDINGS: Ventricles and sulci appear symmetrical with mild cerebral atrophy. No ventricular dilatation. Note that MRI would be more sensitive for detection of intracranial metastases. No mass effect or midline shift. No abnormal extra-axial fluid collections. Gray-white matter junctions are distinct. Basal cisterns are not effaced. No evidence of acute intracranial hemorrhage. No depressed skull fractures. Visualized paranasal sinuses and mastoid air cells are not opacified. Intracranial calcifications demonstrated on the left. These are likely subdural although surface intraparenchymal calcifications are not excluded.   IMPRESSION: No acute intracranial abnormalities. Intracranial calcifications are likely dystrophic or postinflammatory.   Electronically Signed   By: Lucienne Capers M.D.   On: 03/24/2014 04:12   Dg Chest Portable 1 View  03/24/2014   CLINICAL DATA:  Shortness of breath, metastatic cholangiocarcinoma  EXAM: PORTABLE CHEST - 1 VIEW  COMPARISON:  CT chest dated 02/18/2014  FINDINGS: Elevation of the right hemidiaphragm. Associated right basilar opacity, likely atelectasis. Right pleural effusion not excluded.  Left lung is essentially clear. Mild lingular/left lower lobe opacity, possibly atelectasis.  No pneumothorax.  Cardiomegaly.  Right chest power port terminates cavoatrial junction.  IMPRESSION: Possible bilateral lower lobe atelectasis. Right pleural effusion not excluded.  Elevation of the right hemidiaphragm.   Electronically Signed   By: Julian Hy M.D.   On: 03/24/2014 01:53    Medications: Scheduled Meds: . docusate sodium  100 mg Oral BID  . levofloxacin (LEVAQUIN) IV  750 mg Intravenous Q24H  . sodium chloride  3 mL Intravenous Q12H  . spironolactone  12.5 mg Oral Daily      LOS: 2 days   Ripudeep Krystal Eaton M.D. [facility] 03/26/2014, 12:42 PM Pager: [phone]  If 7PM-7AM, please contact night-coverage [email or web address] Password [email or web address]  **Disclaimer: This note was dictated with voice recognition software. Similar sounding words can inadvertently be transcribed and this note may contain transcription errors which may not have been corrected upon publication of note.**

## 2014-03-26 NOTE — Progress Notes (Signed)
CARE MANAGEMENT NOTE 03/26/2014  Patient:  Tammy [name], Tammy [name]   Account Number:  [id number]  Date Initiated:  03/25/2014  Documentation initiated by:  [facility]  Subjective/Objective Assessment:   [address].HX:MET COLON CHOLANGIO CA.     Action/Plan:   FROM HOME W/SPOUSE.SPANISH SPEAKING.   Anticipated DC Date:  03/28/2014   Anticipated DC Plan:  [place]  In-house referral  Interpreting Services      DC Planning Services  CM consult      Choice offered to / List presented to:             Status of service:  In process, will continue to follow Medicare Important Message given?   (If response is "NO", the following Medicare IM given date fields will be blank) Date Medicare IM given:   Date Additional Medicare IM given:    Discharge Disposition:    Per UR Regulation:  Reviewed for med. necessity/level of care/duration of stay  If discussed at [place] of Stay Meetings, dates discussed:    Comments:  03/26/14 Stevie Charter RN,BSN NCM [place].  03/25/14 Kamare Caspers RN,BSN NCM [phone] TELEPHONIC [place] [facility] NEIGHBORS W/C,& RW.WOULD RECOMMEND PT/OT CONS.S/P PARACENTESIS.IV ABX.

## 2014-03-27 DIAGNOSIS — R180 Malignant ascites: Secondary | ICD-10-CM

## 2014-03-27 DIAGNOSIS — C229 Malignant neoplasm of liver, not specified as primary or secondary: Secondary | ICD-10-CM

## 2014-03-27 DIAGNOSIS — D649 Anemia, unspecified: Secondary | ICD-10-CM

## 2014-03-27 DIAGNOSIS — J209 Acute bronchitis, unspecified: Secondary | ICD-10-CM

## 2014-03-27 LAB — BASIC METABOLIC PANEL
BUN: 18 mg/dL (ref 6–23)
CO2: 25 mEq/L (ref 19–32)
Calcium: 9.7 mg/dL (ref 8.4–10.5)
Chloride: 87 mEq/L — ABNORMAL LOW (ref 96–112)
Creatinine, Ser: 0.42 mg/dL — ABNORMAL LOW (ref 0.50–1.10)
Glucose, Bld: 179 mg/dL — ABNORMAL HIGH (ref 70–99)
POTASSIUM: 4.2 meq/L (ref 3.7–5.3)
SODIUM: 122 meq/L — AB (ref 137–147)

## 2014-03-27 LAB — BODY FLUID CULTURE: CULTURE: NO GROWTH

## 2014-03-27 MED ORDER — FUROSEMIDE 40 MG PO TABS
40.0000 mg | ORAL_TABLET | Freq: Every day | ORAL | Status: DC
  Administered 2014-03-27 – 2014-03-28 (×2): 40 mg via ORAL
  Filled 2014-03-27 (×2): qty 1

## 2014-03-27 MED ORDER — FAMOTIDINE IN NACL 20-0.9 MG/50ML-% IV SOLN
20.0000 mg | Freq: Two times a day (BID) | INTRAVENOUS | Status: DC
  Administered 2014-03-27 – 2014-03-28 (×3): 20 mg via INTRAVENOUS
  Filled 2014-03-27 (×5): qty 50

## 2014-03-27 MED ORDER — METOCLOPRAMIDE HCL 5 MG/ML IJ SOLN
5.0000 mg | Freq: Two times a day (BID) | INTRAMUSCULAR | Status: DC
  Administered 2014-03-27: 16:00:00 via INTRAVENOUS
  Administered 2014-03-27 – 2014-03-28 (×2): 5 mg via INTRAVENOUS
  Filled 2014-03-27: qty 1
  Filled 2014-03-27: qty 2
  Filled 2014-03-27: qty 1
  Filled 2014-03-27: qty 2

## 2014-03-27 NOTE — Consult Note (Signed)
Palliative Medicine Team  Met with patient and her husband to discuss goals of care in setting of metastatic cholangiocarcinoma.   1. DNR 2. Symptoms:  Poor PO intake  Persistent Nausea  Severe Fatigue and Weakness  Ascites 3. Care Coordination  She is agreeable to Hospice  She is interested in a [place] for her care-she has complex SM needs with impending malignant obstruction, nausea and rapidly re-accumulating ascites. 4. Psychosocial- patient has strong faith and has "accepted" her condition and is at peace-her husband is getting there but is still struggling with details and her condition overall.  Prognosis: <4 weeks  Full note to follow.  Lane Hacker, DO Palliative Medicine

## 2014-03-27 NOTE — Progress Notes (Signed)
[place]  [phone]    HOSPITAL PROGRESS NOTE  No new events overnight. Intermittent nausea, no vomiting or diarrhea. No respiratory or cardiac complaints. No confusion. She is ready to meet with Hospice today.  MEDICATIONS:  Scheduled Meds: . docusate sodium  100 mg Oral BID  . levofloxacin (LEVAQUIN) IV  750 mg Intravenous Q24H  . sodium chloride  3 mL Intravenous Q12H  . spironolactone  12.5 mg Oral Daily   Continuous Infusions:  PRN Meds:.acetaminophen, acetaminophen, ondansetron (ZOFRAN) IV, ondansetron, oxyCODONE, sodium chloride ALLERGIES:  Allergies  Allergen Reactions  . Tramadol Nausea And Vomiting    headache     PHYSICAL EXAMINATION:   Filed Vitals:   03/27/14 0505  BP: 117/86  Pulse: 116  Temp: 97.9 F (36.6 C)  Resp: 20   Filed Weights   03/25/14 0638 03/26/14 0508 03/27/14 0505  Weight: 129 lb 6.6 oz (58.7 kg) 128 lb 8.5 oz (58.3 kg) 129 lb 10.1 oz (58.57 kg)    58 year old in no acute distress,conversant, alert and oriented to time, place and date.  General well-developed and thin, ill appearing  HEENT: Normocephalic, atraumatic. Temporal wasting.Sclera icteric.Oral cavity without thrush or lesions.  Neck: Supple. No thyromegaly,no cervical or supraclavicular adenopathy  Lungs: decreased breath sounds at the bases. No wheezing, rhonchi or rales.  Right port negative. Cardiac: Regular rate and rhythm, no murmur,rubs or gallops  Abdomen: Somewhat distended,  tender at the lower quadrants,bowel sounds x4.  Extremities: No clubbing cyanosis or edema. No petechial rash. Jaundice noted.  Neuro: No focal or motor deficits      LABORATORY/RADIOLOGY DATA:   Recent Labs Lab 03/24/14 0130 03/24/14 0840 03/25/14 0500  WBC 11.2* 7.8 8.8  HGB 9.3* 8.8* 8.9*  HCT 26.7* 25.5* 26.5*  PLT 204 154 177  MCV 96.0 97.7 98.5  MCH 33.5 33.7 33.1  MCHC 34.8 34.5 33.6  RDW 22.2* 22.1* 23.1*    CMP    Recent Labs Lab  03/24/14 0130 03/24/14 0840 03/25/14 0500 03/26/14 1345 03/27/14 0530  NA 124* 126* 122* 122* 122*  K 4.5 4.3 4.0 4.1 4.2  CL 87* 90* 87* 87* 87*  CO2 $Re'25 26 27 25 25  'tSL$ GLUCOSE 147* 118* 123* 191* 179*  BUN $Re'18 17 17 19 18  'PJd$ CREATININE 0.40* 0.37* 0.43* 0.46* 0.42*  CALCIUM 10.0 9.5 9.8 10.0 9.7  MG  --  1.6  --   --   --   AST 115* 101*  --   --   --   ALT 11 10  --   --   --   ALKPHOS 497* 424*  --   --   --   BILITOT 4.6* 5.0*  --   --   --         Component Value Date/Time   BILITOT 5.0* 03/24/2014 0840   BILITOT 2.56* 03/13/2014 0840      Recent Labs Lab 03/24/14 0415 03/25/14 0500  INR 1.52* 1.64*      Urinalysis    Component Value Date/Time   COLORURINE AMBER* 03/24/2014 0440   APPEARANCEUR CLOUDY* 03/24/2014 0440   LABSPEC 1.023 03/24/2014 0440   PHURINE 5.5 03/24/2014 0440   GLUCOSEU NEGATIVE 03/24/2014 0440   HGBUR NEGATIVE 03/24/2014 0440   BILIRUBINUR SMALL* 03/24/2014 0440   KETONESUR NEGATIVE 03/24/2014 0440   PROTEINUR 30* 03/24/2014 0440   UROBILINOGEN 1.0 03/24/2014 0440   NITRITE NEGATIVE 03/24/2014 0440   LEUKOCYTESUR NEGATIVE 03/24/2014 0440  Drugs of Abuse  No results found for this basename: labopia, cocainscrnur, labbenz, amphetmu, thcu, labbarb     Liver Function Tests:  Recent Labs Lab 03/24/14 0130 03/24/14 0840  AST 115* 101*  ALT 11 10  ALKPHOS 497* 424*  BILITOT 4.6* 5.0*  PROT 6.5 5.7*  ALBUMIN 2.6* 2.2*    Recent Labs Lab 03/24/14 0130  LIPASE 16    Recent Labs Lab 03/24/14 0415  AMMONIA 37     Cardiac Enzymes:  Recent Labs Lab 03/24/14 0130  TROPONINI <0.30     Radiology Studies:  Ct Head Wo Contrast  03/24/2014   CLINICAL DATA:  Liver cancer with shortness of breath. Altered mental status.  EXAM: CT HEAD WITHOUT CONTRAST  TECHNIQUE: Contiguous axial images were obtained from the base of the skull through the vertex without intravenous contrast.  COMPARISON:  DG CERVICAL SPINE COMPLETE dated 11/28/2012  FINDINGS:  Ventricles and sulci appear symmetrical with mild cerebral atrophy. No ventricular dilatation. Note that MRI would be more sensitive for detection of intracranial metastases. No mass effect or midline shift. No abnormal extra-axial fluid collections. Gray-white matter junctions are distinct. Basal cisterns are not effaced. No evidence of acute intracranial hemorrhage. No depressed skull fractures. Visualized paranasal sinuses and mastoid air cells are not opacified. Intracranial calcifications demonstrated on the left. These are likely subdural although surface intraparenchymal calcifications are not excluded.  IMPRESSION: No acute intracranial abnormalities. Intracranial calcifications are likely dystrophic or postinflammatory.   Electronically Signed   By: Lucienne Capers M.D.   On: 03/24/2014 04:12   Dg Chest Portable 1 View  03/24/2014   CLINICAL DATA:  Shortness of breath, metastatic cholangiocarcinoma  EXAM: PORTABLE CHEST - 1 VIEW  COMPARISON:  CT chest dated 02/18/2014  FINDINGS: Elevation of the right hemidiaphragm. Associated right basilar opacity, likely atelectasis. Right pleural effusion not excluded.  Left lung is essentially clear. Mild lingular/left lower lobe opacity, possibly atelectasis. No pneumothorax.  Cardiomegaly.  Right chest power port terminates cavoatrial junction.  IMPRESSION: Possible bilateral lower lobe atelectasis. Right pleural effusion not excluded.  Elevation of the right hemidiaphragm.   Electronically Signed   By: Julian Hy M.D.   On: 03/24/2014 01:53       ASSESSMENT AND PLAN:   19. 58 years old Hispanic female with metastatic cholangiocarcinoma s/p multiple chemo regimens discontinued after disease progression, now on Xeloda 650 mg/M2 twice a day. Cycle 2 began on 4/22 with last dose on 5/2. Progression of disease present. She had ascites, requiring paracentesis with cultures negative. CT chest showed enlargement of prevascular lymph node concerning for  mediastinal metastasis, significant progression of hepatic metastasis, several new lesions, evidence of portal hypertension.   Xeloda discontinued. Hospice  Consultation today with interpreter as chemo options are exhausted. Dr. Julien Nordmann to write an addendum.   2. SIRS (systemic inflammatory response syndrome)   On Levaquin. Blood cultures pending.   3. Anemia  in the setting of chemo and chronic disease. No bleeding issues.   4. Pleural effusion  On lasix and small dose of spironolactone.    5. Full Code. Hospice to see to discuss goals of care.    **Disclaimer: This note was dictated with voice recognition software. Similar sounding words can inadvertently be transcribed and this note may contain transcription errors which may not have been corrected upon publication of note.Rondel Jumbo, PA-C 03/27/2014, 11:28 AM  ADDENDUM: Hematology/Oncology Attending:  The patient is seen and examined today. I agree with the above  note. Her husband was at the bedside. The patient continues to have significant fatigue and weakness in addition to icteric sclera and yellowish discoloration of her skin. She is comfortable today except for occasional pain at the right upper quadrant. The patient and her husband met with the palliative care team. Her husband is not interested in considering disposition to [facility] for hospice care. He doesn't think that he can take care of his wife at home after discharge. I discharge to [facility] place would be a reasonable option for Ms. Pricilla Holm. Thank you again for taking good care of Ms. Pricilla Holm. Please call if you have any questions.

## 2014-03-27 NOTE — Progress Notes (Signed)
Patient [name]      DOB: December 10, 1955      KOE:[id number]  Consult received by PMT, husband needed to leave for work (3rd shift) and interpretors not open for scheduling this afternoon.  Will get with both as quickly as possible today.  Benino Korinek L. Lovena Le, MD MBA The Palliative Medicine Team at [facility] Phone: [phone] Pager: [phone]

## 2014-03-27 NOTE — Progress Notes (Signed)
CSW received consult from Dr. Hilma Favors for Residential Hospice placement. CSW met with patient's husband at bedside while patient rested. CSW provided husband with list of Residential Hospice facilities and husband requested [facility] as they live close to there. CSW awaiting response from [place], [place] Liaison re: bed availability/eligbility.   Raynaldo Opitz, [facility] Clinical Social Worker cell #: [phone]

## 2014-03-27 NOTE — Progress Notes (Addendum)
Patient ID: Tammy [name]  female  ZJQ:[id number]    DOB: [birth year]    DOA: 03/24/2014  PCP: Eilleen Kempf., MD  Assessment/Plan: Fever/SIRS - improved -etiology unclear, CXR with possible atelectasis - CT chest 2 showed enlargement of prevascular lymph node concerning for mediastinal metastasis, significant progression of hepatic metastasis, several new lesions, evidence of portal hypertension -continue levaquin for 7 days total, afebrile now -US guided paracentesis done with 2 L removed, no SBP, cultures negative - Blood CX  negative   Hyponatremia  worsened to 122 yesterday, also likely due to metastatic cholangiocarcinoma -hypervolemic/third spacing clinically, Lasix held, will resume this today -has gained 5kg weight this admission -continue  spironolactone   Abnormal LFTS  -likely due to liver mets   Anemia  -due to chronic disease/chemo   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 Julien Nordmann, recommended discontinuing the current chemotherapy, prognosis at this time and recommended palliative care. Plan for meeting today  DVT Prophylaxis:  Code Status:Full Code  Family Communication: discussed with patient and her family member at the bedside.   Disposition:Likely home with hospice  Consultants:   oncology, Dr. Julien Nordmann  Procedures:   ultrasound-guided paracentesis  Antibiotics:   levofloxacin     Subjective:  no fevers overnight, deconditioned, + abdominal pain  Objective: Weight change: 0.5 kg (1 lb 1.6 oz)  Intake/Output Summary (Last 24 hours) at 03/27/14 1338 Last data filed at 03/27/14 1100  Gross per 24 hour  Intake    570 ml  Output    385 ml  Net    185 ml   Blood pressure 117/86, pulse 116, temperature 97.9 F (36.6 C), temperature source Oral, resp. rate 20, height 5' (1.524 m), weight 58.8 kg (129 lb 10.1 oz), SpO2 98.00%.  Physical Exam: General: Alert  and awake, oriented x3, cachectic, sickly appearing  CVS: S1-S2 clear, no murmur rubs or gallops Chest: dec BS at bases Abdomen: soft nontender, Mildly distended, normal bowel sounds  Extremities: no cyanosis, clubbing    Lab Results: Basic Metabolic Panel:  Recent Labs Lab 03/24/14 0840  03/26/14 1345 03/27/14 0530  NA 126*  < > 122* 122*  K 4.3  < > 4.1 4.2  CL 90*  < > 87* 87*  CO2 26  < > 25 25  GLUCOSE 118*  < > 191* 179*  BUN 17  < > 19 18  CREATININE 0.37*  < > 0.46* 0.42*  CALCIUM 9.5  < > 10.0 9.7  MG 1.6  --   --   --   PHOS 2.1*  --   --   --   < > = values in this interval not displayed. Liver Function Tests:  Recent Labs Lab 03/24/14 0130 03/24/14 0840  AST 115* 101*  ALT 11 10  ALKPHOS 497* 424*  BILITOT 4.6* 5.0*  PROT 6.5 5.7*  ALBUMIN 2.6* 2.2*    Recent Labs Lab 03/24/14 0130  LIPASE 16    Recent Labs Lab 03/24/14 0415  AMMONIA 37   CBC:  Recent Labs Lab 03/24/14 0840 03/25/14 0500  WBC 7.8 8.8  HGB 8.8* 8.9*  HCT 25.5* 26.5*  MCV 97.7 98.5  PLT 154 177   Cardiac Enzymes:  Recent Labs Lab 03/24/14 0130  TROPONINI <0.30   BNP: No components found with this basename: POCBNP,  CBG: No results found for this basename: GLUCAP,  in the last 168 hours   Micro Results:  Recent Results (from the past 240 hour(s))  CULTURE, BLOOD (ROUTINE X 2)     Status: None   Collection Time    03/24/14  4:15 AM      Result Value Ref Range Status   Specimen Description BLOOD LEFT ARM   Final   Special Requests BOTTLES DRAWN AEROBIC AND ANAEROBIC [facility] EACH   Final   Culture  Setup Time     Final   Value: 03/24/2014 11:54     Performed at [facility]   Culture     Final   Value:        BLOOD CULTURE RECEIVED NO GROWTH TO DATE CULTURE WILL BE HELD FOR 5 DAYS BEFORE ISSUING A FINAL NEGATIVE REPORT     Performed at [facility]   Report Status PENDING   Incomplete  CULTURE, BLOOD (ROUTINE X 2)     Status: None    Collection Time    03/24/14  4:20 AM      Result Value Ref Range Status   Specimen Description BLOOD LEFT HAND   Final   Special Requests BOTTLES DRAWN AEROBIC AND ANAEROBIC [facility] EACH   Final   Culture  Setup Time     Final   Value: 03/24/2014 11:54     Performed at [facility]   Culture     Final   Value:        BLOOD CULTURE RECEIVED NO GROWTH TO DATE CULTURE WILL BE HELD FOR 5 DAYS BEFORE ISSUING A FINAL NEGATIVE REPORT     Performed at [facility]   Report Status PENDING   Incomplete  BODY FLUID CULTURE     Status: None   Collection Time    03/24/14 12:04 PM      Result Value Ref Range Status   Specimen Description PERITONEAL   Final   Special Requests Immunocompromised   Final   Gram Stain     Final   Value: WBC PRESENT,BOTH PMN AND MONONUCLEAR     NO ORGANISMS SEEN     Performed at [facility]   Culture     Final   Value: NO GROWTH 3 DAYS     Performed at [facility]   Report Status 03/27/2014 FINAL   Final    Studies/Results: Ct Head Wo Contrast  03/24/2014   CLINICAL DATA:  Liver cancer with shortness of breath. Altered mental status.  EXAM: CT HEAD WITHOUT CONTRAST  TECHNIQUE: Contiguous axial images were obtained from the base of the skull through the vertex without intravenous contrast.  COMPARISON:  DG CERVICAL SPINE COMPLETE dated 11/28/2012  FINDINGS: Ventricles and sulci appear symmetrical with mild cerebral atrophy. No ventricular dilatation. Note that MRI would be more sensitive for detection of intracranial metastases. No mass effect or midline shift. No abnormal extra-axial fluid collections. Gray-white matter junctions are distinct. Basal cisterns are not effaced. No evidence of acute intracranial hemorrhage. No depressed skull fractures. Visualized paranasal sinuses and mastoid air cells are not opacified. Intracranial calcifications demonstrated on the left. These are likely subdural although surface intraparenchymal calcifications  are not excluded.  IMPRESSION: No acute intracranial abnormalities. Intracranial calcifications are likely dystrophic or postinflammatory.   Electronically Signed   By: Lucienne Capers M.D.   On: 03/24/2014 04:12   Dg Chest Portable 1 View  03/24/2014   CLINICAL DATA:  Shortness of breath, metastatic cholangiocarcinoma  EXAM: PORTABLE CHEST - 1 VIEW  COMPARISON:  CT chest dated 02/18/2014  FINDINGS: Elevation of the right hemidiaphragm. Associated right basilar opacity, likely atelectasis. Right pleural effusion not excluded.  Left lung is essentially clear. Mild lingular/left lower lobe opacity, possibly atelectasis. No pneumothorax.  Cardiomegaly.  Right chest power port terminates cavoatrial junction.  IMPRESSION: Possible bilateral lower lobe atelectasis. Right pleural effusion not excluded.  Elevation of the right hemidiaphragm.   Electronically Signed   By: Julian Hy M.D.   On: 03/24/2014 01:53    Medications: Scheduled Meds: . docusate sodium  100 mg Oral BID  . levofloxacin (LEVAQUIN) IV  750 mg Intravenous Q24H  . sodium chloride  3 mL Intravenous Q12H  . spironolactone  12.5 mg Oral Daily      LOS: 3 days   Domenic Polite M.D. [facility] 03/27/2014, 1:38 PM Pager: [phone]  If 7PM-7AM, please contact night-coverage [email or web address] Password [email or web address]

## 2014-03-28 DIAGNOSIS — R509 Fever, unspecified: Secondary | ICD-10-CM

## 2014-03-28 LAB — BASIC METABOLIC PANEL
BUN: 16 mg/dL (ref 6–23)
CALCIUM: 9.6 mg/dL (ref 8.4–10.5)
CHLORIDE: 86 meq/L — AB (ref 96–112)
CO2: 26 mEq/L (ref 19–32)
CREATININE: 0.44 mg/dL — AB (ref 0.50–1.10)
GFR calc Af Amer: >90 mL/min (ref >90)
GFR calc non Af Amer: >90 mL/min (ref >90)
Glucose, Bld: 134 mg/dL — ABNORMAL HIGH (ref 70–99)
Potassium: 3.6 mEq/L — ABNORMAL LOW (ref 3.7–5.3)
Sodium: 124 mEq/L — ABNORMAL LOW (ref 137–147)

## 2014-03-28 MED ORDER — HEPARIN SOD (PORK) LOCK FLUSH 100 UNIT/ML IV SOLN
500.0000 [IU] | INTRAVENOUS | Status: AC | PRN
  Administered 2014-03-28: 500 [IU]

## 2014-03-28 MED ORDER — FUROSEMIDE 40 MG PO TABS: 40.0000 mg | ORAL_TABLET | Freq: Every day | ORAL | Status: AC

## 2014-03-28 MED ORDER — LORAZEPAM 1 MG PO TABS: 1.0000 mg | ORAL_TABLET | ORAL | Status: AC | PRN

## 2014-03-28 MED ORDER — DSS 100 MG PO CAPS: 100.0000 mg | ORAL_CAPSULE | Freq: Two times a day (BID) | ORAL | Status: AC

## 2014-03-28 MED ORDER — OXYCODONE HCL 5 MG PO CAPS: 5.0000 mg | ORAL_CAPSULE | ORAL | Status: AC | PRN

## 2014-03-28 NOTE — Progress Notes (Signed)
CSW received call from [facility] stating that a bed opened up at [facility] for patient today. CSW & [facility] CSW, Lauren met with patient & husband with interpreter assistance to make them aware. Patient to discharge at 3:15pm today ([facility] called & scheduled). Discharge packet in [place], Manuela Schwartz aware.   Raynaldo Opitz, [facility] Clinical Social Worker cell #: [phone]

## 2014-03-28 NOTE — Discharge Summary (Addendum)
Physician Discharge Summary  Tammy [name]:[id number] DOB: [birth year] DOA: 03/24/2014  PCP: Tammy [name]., MD  Admit date: 03/24/2014 Discharge date: 03/28/2014  Time spent: 45 minutes  Recommendations for Outpatient Follow-up:  [facility]. [facility]  Discharge Diagnoses:     SIRS-likely due to infectious source   Metastatic cholangiocarcinoma   Essential hypertension, benign   Hyponatremia   Antineoplastic chemotherapy induced anemia   Fever   Pleural effusion associated with hepatic disorder   Discharge Condition: poor  Diet recommendation: comfort feeds  Filed Weights   03/26/14 0508 03/27/14 0505 03/28/14 0640  Weight: 58.3 kg (128 lb 8.5 oz) 58.8 kg (129 lb 10.1 oz) 59.8 kg (131 lb 13.4 oz)    History of present illness:  Tammy [name] is a 58 y.o. 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  Presented with  Patient in currently on xeloda for hx of metastatic cholangiocarcinoma. Patient have been short of breath worse over past 24 hours. Her husband noted that she was short of breath and brought her to ER where a CXR was done showing right pleural effusion and patient was noted to be febrile. Family states she is confused at times. Denies easy bruising or bleeding. She have had increased lower extremity swelling. Family states her mobility has declined and now she is bed bound.    Hospital Course:   Fever/SIRS - improved  -likely infectious source -etiology unclear, CXR with possible atelectasis  - CT chest 2 showed enlargement of prevascular lymph node concerning for mediastinal metastasis, significant progression of hepatic metastasis, several new lesions, evidence of portal hypertension  -afebrile now, was on levaquin, stopped this -US guided paracentesis done with 2 L removed, no SBP, cultures negative  - Blood CX  negative  - now plan for comfort care at [facility]  Hyponatremia  -hypervolemic/third spacing clinically, Lasix held, resumed this  -has gained 5kg weight this admission   Abnormal LFTS  -likely due to liver mets   Anemia  -due to chronic disease/chemo   Metastatic cholangiocarcinoma progressively worsened on the CT  - hold Xeloda at this time, oncology consulted, per Dr. Julien Nordmann, recommended discontinuing the current chemotherapy, prognosis at this time and recommended palliative care. -had Palliative meeting, decision made for [facility]/Comfort care    Consultations:  Onc  Palliative  Discharge Exam: Filed Vitals:   03/28/14 0640  BP: 111/65  Pulse: 114  Temp: 98.2 F (36.8 C)  Resp: 18    General: AAO to self, place, lethargic Cardiovascular: S1S2/RRR Respiratory: CTAB  Discharge Instructions You were cared for by a hospitalist during your hospital stay. If you have any questions about your discharge medications or the care you received while you were in the hospital after you are discharged, you can call the unit and asked to speak with the hospitalist on call if the hospitalist that took care of you is not available. Once you are discharged, your primary care physician will handle any further medical issues. Please note that NO REFILLS for any discharge medications will be authorized once you are discharged, as it is imperative that you return to your primary care physician (or establish a relationship with a primary care physician if you do not have one) for your aftercare needs so that they can reassess your need for medications and monitor your lab values.  Discharge Orders   Future Appointments  Provider Department Dept Phone   04/03/2014 9:00 AM [facility] [phone]   04/03/2014 9:30 AM [facility] 2 [facility] [phone]   Liquids only 4 hours prior to your exam.  Any medications can be taken as usual. Please arrive 15 min prior to your scheduled exam time.   04/09/2014 10:15 AM Curt Bears, MD [facility] [phone]   Future Orders Complete By Expires   Discharge instructions  As directed    Increase activity slowly  As directed        Medication List    STOP taking these medications       capecitabine 500 MG tablet  Commonly known as:  XELODA     dexamethasone 4 MG tablet  Commonly known as:  DECADRON      TAKE these medications       DSS 100 MG Caps  Take 100 mg by mouth 2 (two) times daily.     furosemide 40 MG tablet  Commonly known as:  LASIX  Take 1 tablet (40 mg total) by mouth daily.     ibuprofen 200 MG tablet  Commonly known as:  ADVIL,MOTRIN  Take 400 mg by mouth every 6 (six) hours as needed for fever or moderate pain.     lidocaine-prilocaine cream  Commonly known as:  EMLA  Apply topically as needed. Apply to port 1 hr before chemo     LORazepam 1 MG tablet  Commonly known as:  ATIVAN  Take 1 tablet (1 mg total) by mouth every 4 (four) hours as needed for anxiety.     oxycodone 5 MG capsule  Commonly known as:  OXY-IR  Take 1 capsule (5 mg total) by mouth every 4 (four) hours as needed.     prochlorperazine 10 MG tablet  Commonly known as:  COMPAZINE  Take 1 tablet (10 mg total) by mouth every 6 (six) hours as needed for nausea or vomiting.     promethazine 25 MG tablet  Commonly known as:  PHENERGAN  Take 1 tablet (25 mg total) by mouth every 8 (eight) hours as needed for nausea or vomiting.       Allergies  Allergen Reactions  . Tramadol Nausea And Vomiting    headache      The results of significant diagnostics from this hospitalization (including imaging, microbiology, ancillary and laboratory) are listed below for reference.    Significant Diagnostic Studies: Ct Head Wo Contrast  03/24/2014   CLINICAL DATA:  Liver cancer with shortness of breath. Altered  mental status.  EXAM: CT HEAD WITHOUT CONTRAST  TECHNIQUE: Contiguous axial images were obtained from the base of the skull through the vertex without intravenous contrast.  COMPARISON:  DG CERVICAL SPINE COMPLETE dated 11/28/2012  FINDINGS: Ventricles and sulci appear symmetrical with mild cerebral atrophy. No ventricular dilatation. Note that MRI would be more sensitive for detection of intracranial metastases. No mass effect or midline shift. No abnormal extra-axial fluid collections. Gray-white matter junctions are distinct. Basal cisterns are not effaced. No evidence of acute intracranial hemorrhage. No depressed skull fractures. Visualized paranasal sinuses and mastoid air cells are not opacified. Intracranial calcifications demonstrated on the left. These are likely subdural although surface intraparenchymal calcifications are not excluded.  IMPRESSION: No acute intracranial abnormalities. Intracranial calcifications are likely dystrophic or postinflammatory.   Electronically Signed   By: Lucienne Capers M.D.   On: 03/24/2014 04:12   US Paracentesis  03/28/2014   CLINICAL DATA:  Abdominal ascites  EXAM: ULTRASOUND GUIDED right lower quadrant PARACENTESIS  COMPARISON:  None.  PROCEDURE: An ultrasound guided paracentesis was thoroughly discussed with the patient and questions answered. The benefits, risks, alternatives and complications were also discussed. The patient understands and wishes to proceed with the procedure. Written consent was obtained.  Ultrasound was performed to localize and mark an adequate pocket of fluid in the right lower quadrant of the abdomen. The area was then prepped and draped in the normal sterile fashion. 1% Lidocaine was used for local anesthesia. Under ultrasound guidance a 19 gauge Yueh catheter was introduced. Paracentesis was performed. The catheter was removed and a dressing applied.  Complications: None.  FINDINGS: A total of approximately 2 L of yellow fluid was removed. A  fluid sample was sent for laboratory analysis.  IMPRESSION: Successful ultrasound guided paracentesis yielding 2 L of ascites.  Read by: Jannifer Franklin [facility]   Electronically Signed   By: Sandi Mariscal M.D.   On: 03/26/2014 09:20   Dg Chest Portable 1 View  03/24/2014   CLINICAL DATA:  Shortness of breath, metastatic cholangiocarcinoma  EXAM: PORTABLE CHEST - 1 VIEW  COMPARISON:  CT chest dated 02/18/2014  FINDINGS: Elevation of the right hemidiaphragm. Associated right basilar opacity, likely atelectasis. Right pleural effusion not excluded.  Left lung is essentially clear. Mild lingular/left lower lobe opacity, possibly atelectasis. No pneumothorax.  Cardiomegaly.  Right chest power port terminates cavoatrial junction.  IMPRESSION: Possible bilateral lower lobe atelectasis. Right pleural effusion not excluded.  Elevation of the right hemidiaphragm.   Electronically Signed   By: Julian Hy M.D.   On: 03/24/2014 01:53    Microbiology: Recent Results (from the past 240 hour(s))  CULTURE, BLOOD (ROUTINE X 2)     Status: None   Collection Time    03/24/14  4:15 AM      Result Value Ref Range Status   Specimen Description BLOOD LEFT ARM   Final   Special Requests BOTTLES DRAWN AEROBIC AND ANAEROBIC [facility] EACH   Final   Culture  Setup Time     Final   Value: 03/24/2014 11:54     Performed at [facility]   Culture     Final   Value:        BLOOD CULTURE RECEIVED NO GROWTH TO DATE CULTURE WILL BE HELD FOR 5 DAYS BEFORE ISSUING A FINAL NEGATIVE REPORT     Performed at [facility]   Report Status PENDING   Incomplete  CULTURE, BLOOD (ROUTINE X 2)     Status: None   Collection Time    03/24/14  4:20 AM      Result Value Ref Range Status   Specimen Description BLOOD LEFT HAND   Final   Special Requests BOTTLES DRAWN AEROBIC AND ANAEROBIC [facility] EACH   Final   Culture  Setup Time     Final   Value: 03/24/2014 11:54     Performed at [facility]   Culture     Final   Value:         BLOOD CULTURE RECEIVED NO GROWTH TO DATE CULTURE WILL BE HELD FOR 5 DAYS BEFORE ISSUING A FINAL NEGATIVE REPORT     Performed at [facility]   Report Status PENDING   Incomplete  BODY FLUID CULTURE     Status: None   Collection Time    03/24/14 12:04 PM      Result Value  Ref Range Status   Specimen Description PERITONEAL   Final   Special Requests Immunocompromised   Final   Gram Stain     Final   Value: WBC PRESENT,BOTH PMN AND MONONUCLEAR     NO ORGANISMS SEEN     Performed at [facility]   Culture     Final   Value: NO GROWTH 3 DAYS     Performed at [facility]   Report Status 03/27/2014 FINAL   Final     Labs: Basic Metabolic Panel:  Recent Labs Lab 03/24/14 0130 03/24/14 0840 03/25/14 0500 03/26/14 1345 03/27/14 0530 03/28/14 0415  NA 124* 126* 122* 122* 122* 124*  K 4.5 4.3 4.0 4.1 4.2 3.6*  CL 87* 90* 87* 87* 87* 86*  CO2 25 26 27 25 25 26   GLUCOSE 147* 118* 123* 191* 179* 134*  BUN 18 17 17 19 18 16   CREATININE 0.40* 0.37* 0.43* 0.46* 0.42* 0.44*  CALCIUM 10.0 9.5 9.8 10.0 9.7 9.6  MG  --  1.6  --   --   --   --   PHOS  --  2.1*  --   --   --   --    Liver Function Tests:  Recent Labs Lab 03/24/14 0130 03/24/14 0840  AST 115* 101*  ALT 11 10  ALKPHOS 497* 424*  BILITOT 4.6* 5.0*  PROT 6.5 5.7*  ALBUMIN 2.6* 2.2*    Recent Labs Lab 03/24/14 0130  LIPASE 16    Recent Labs Lab 03/24/14 0415  AMMONIA 37   CBC:  Recent Labs Lab 03/24/14 0130 03/24/14 0840 03/25/14 0500  WBC 11.2* 7.8 8.8  HGB 9.3* 8.8* 8.9*  HCT 26.7* 25.5* 26.5*  MCV 96.0 97.7 98.5  PLT 204 154 177   Cardiac Enzymes:  Recent Labs Lab 03/24/14 0130  TROPONINI <0.30   BNP: BNP (last 3 results)  Recent Labs  03/24/14 0130  PROBNP 823.4*   CBG: No results found for this basename: GLUCAP,  in the last 168 hours     Signed:  Domenic Polite  [facility] 03/28/2014, 10:21 AM

## 2014-03-28 NOTE — Consult Note (Signed)
[facility] Liaison: Received request from [place] for family interest in [place] 03/27/2014. Chart reviewed at that time. Unfortunately no Administrator. Have made CSW aware. Will continue to follow, update CSW if availability changes for this patient. Thank you. Erling Conte LCSW [phone]

## 2014-03-28 NOTE — Care Management Note (Signed)
    Page 1 of 1   03/28/2014     11:18:13 AM CARE MANAGEMENT NOTE 03/28/2014  Patient:  Tammy [name], Tammy [name]   Account Number:  [id number]  Date Initiated:  03/25/2014  Documentation initiated by:  [facility]  Subjective/Objective Assessment:   [place].HX:MET COLON CHOLANGIO CA.     Action/Plan:   FROM HOME W/SPOUSE.SPANISH SPEAKING.   Anticipated DC Date:  03/28/2014   Anticipated DC Plan:  [place]  In-house referral  Interpreting Services      DC Planning Services  CM consult      Choice offered to / List presented to:             Status of service:  Completed, signed off Medicare Important Message given?   (If response is "NO", the following Medicare IM given date fields will be blank) Date Medicare IM given:   Date Additional Medicare IM given:    Discharge Disposition:  [place]  Per UR Regulation:  Reviewed for med. necessity/level of care/duration of stay  If discussed at [place] of Stay Meetings, dates discussed:    Comments:  03/28/14 Wilhemina Grall RN,BSN NCM [phone] PMT-[facility].D/C [facility].  03/26/14 Sible Straley RN,BSN NCM [phone] PMT CONS.AWAIT RECOMMENDATIONS.  03/25/14 Makenzi Bannister RN,BSN NCM [phone] TELEPHONIC INTERPRETING SERVICES USED:PATIENT USES A NEIGHBORS W/C,& RW.WOULD RECOMMEND PT/OT CONS.S/P PARACENTESIS.IV ABX.

## 2014-03-28 NOTE — Progress Notes (Signed)
Report called to [facility].

## 2014-03-30 LAB — CULTURE, BLOOD (ROUTINE X 2)
Culture: NO GROWTH
Culture: NO GROWTH

## 2014-04-03 ENCOUNTER — Other Ambulatory Visit: Payer: Self-pay

## 2014-04-03 ENCOUNTER — Ambulatory Visit (HOSPITAL_COMMUNITY): Payer: BC Managed Care – PPO

## 2014-04-03 ENCOUNTER — Telehealth: Payer: Self-pay | Admitting: Medical Oncology

## 2014-04-03 NOTE — Telephone Encounter (Signed)
[facility] staff spoke to husband who stated pt passed away.

## 2014-04-05 ENCOUNTER — Telehealth: Payer: Self-pay | Admitting: Medical Oncology

## 2014-04-05 NOTE — Telephone Encounter (Signed)
Pt died on May 10 934 pm

## 2014-04-09 ENCOUNTER — Ambulatory Visit: Payer: Self-pay | Admitting: Internal Medicine

## 2014-04-22 DEATH — deceased

## 2014-07-16 ENCOUNTER — Other Ambulatory Visit: Payer: Self-pay | Admitting: *Deleted

## 2014-08-26 IMAGING — CR DG ABDOMEN 2V
2 series · 2 of 2 positions shown · non-contrast
Comparison: CT 09/19/2013

CLINICAL DATA: Nausea and vomiting.

EXAM:
ABDOMEN - 2 VIEW

[w abdomen decub]
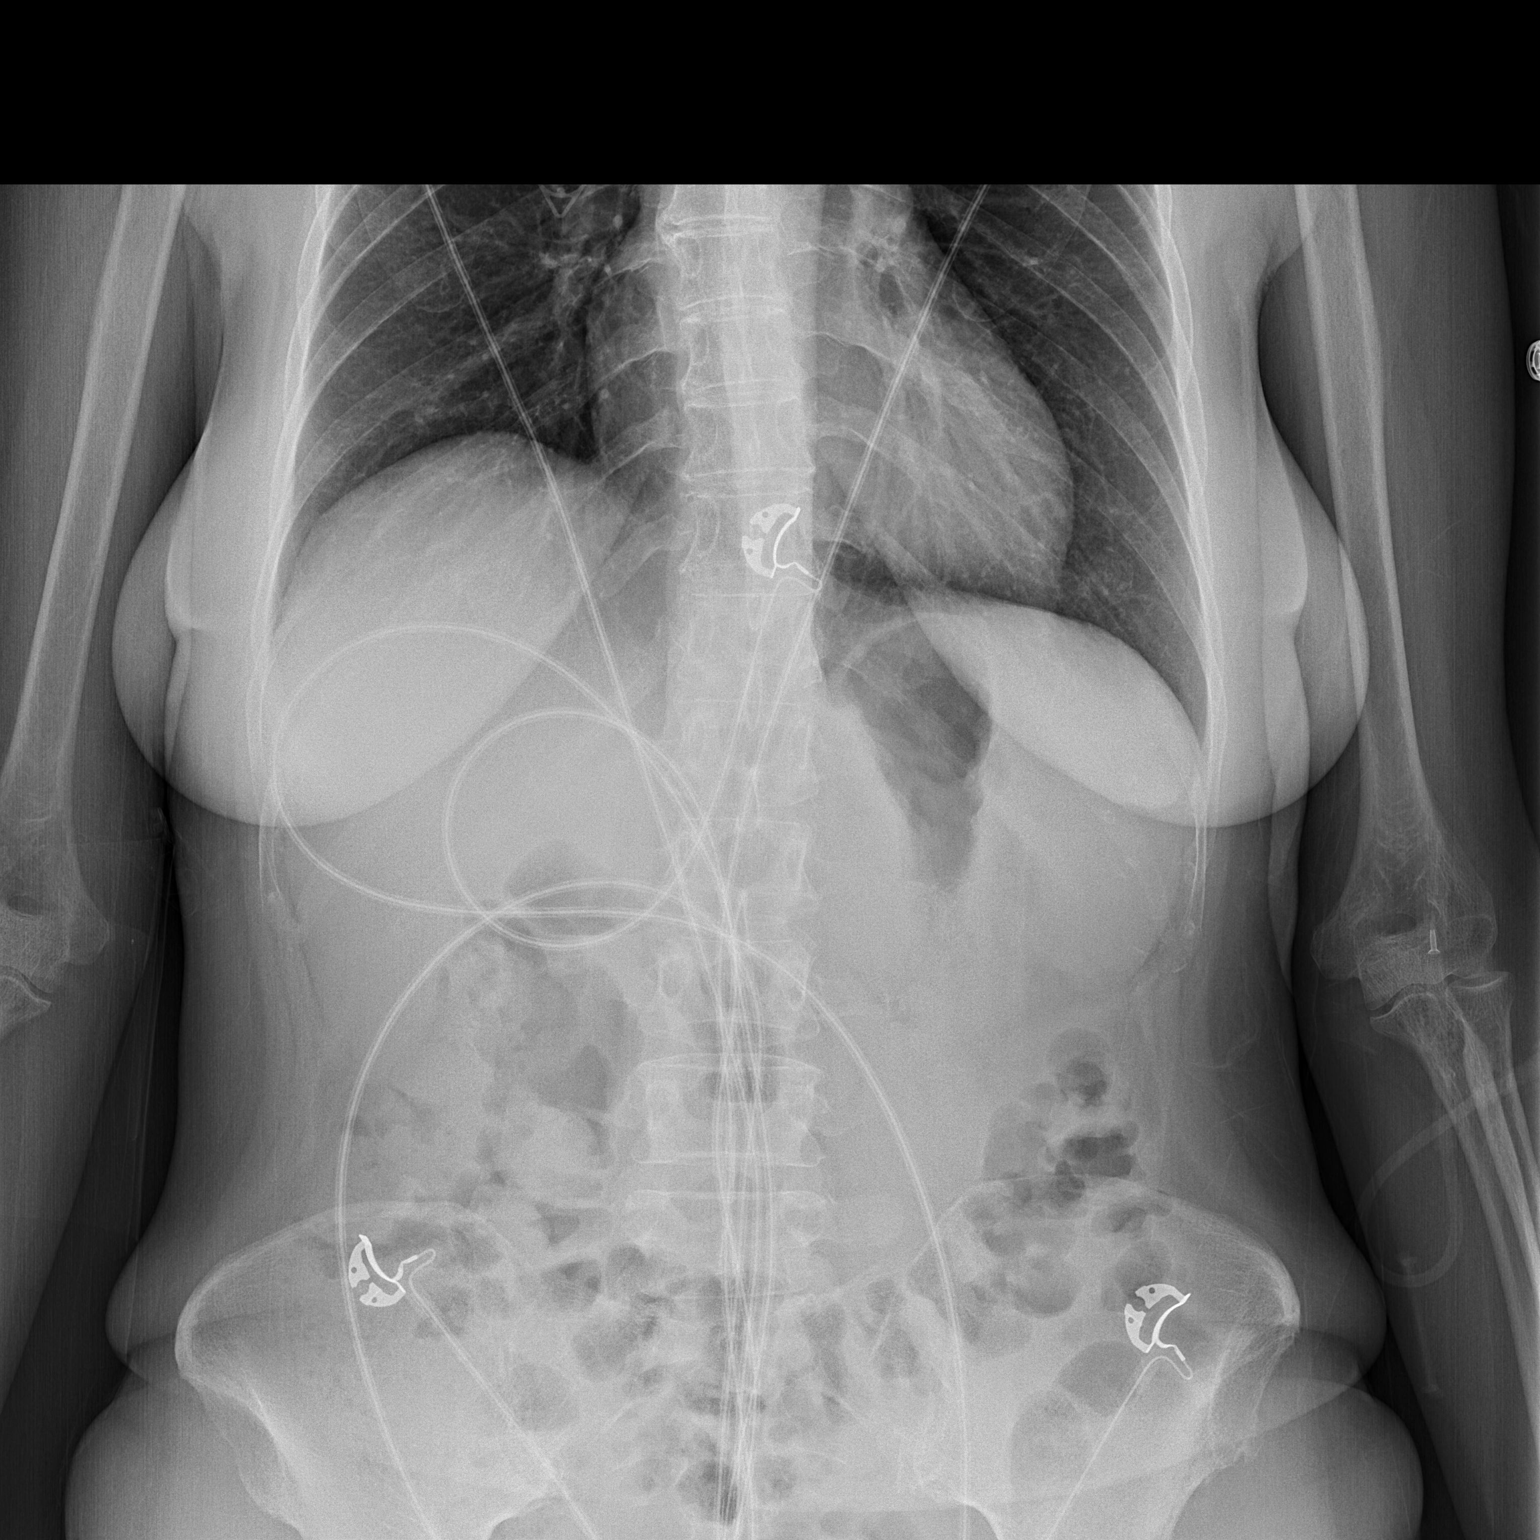

[t abdomen supine]
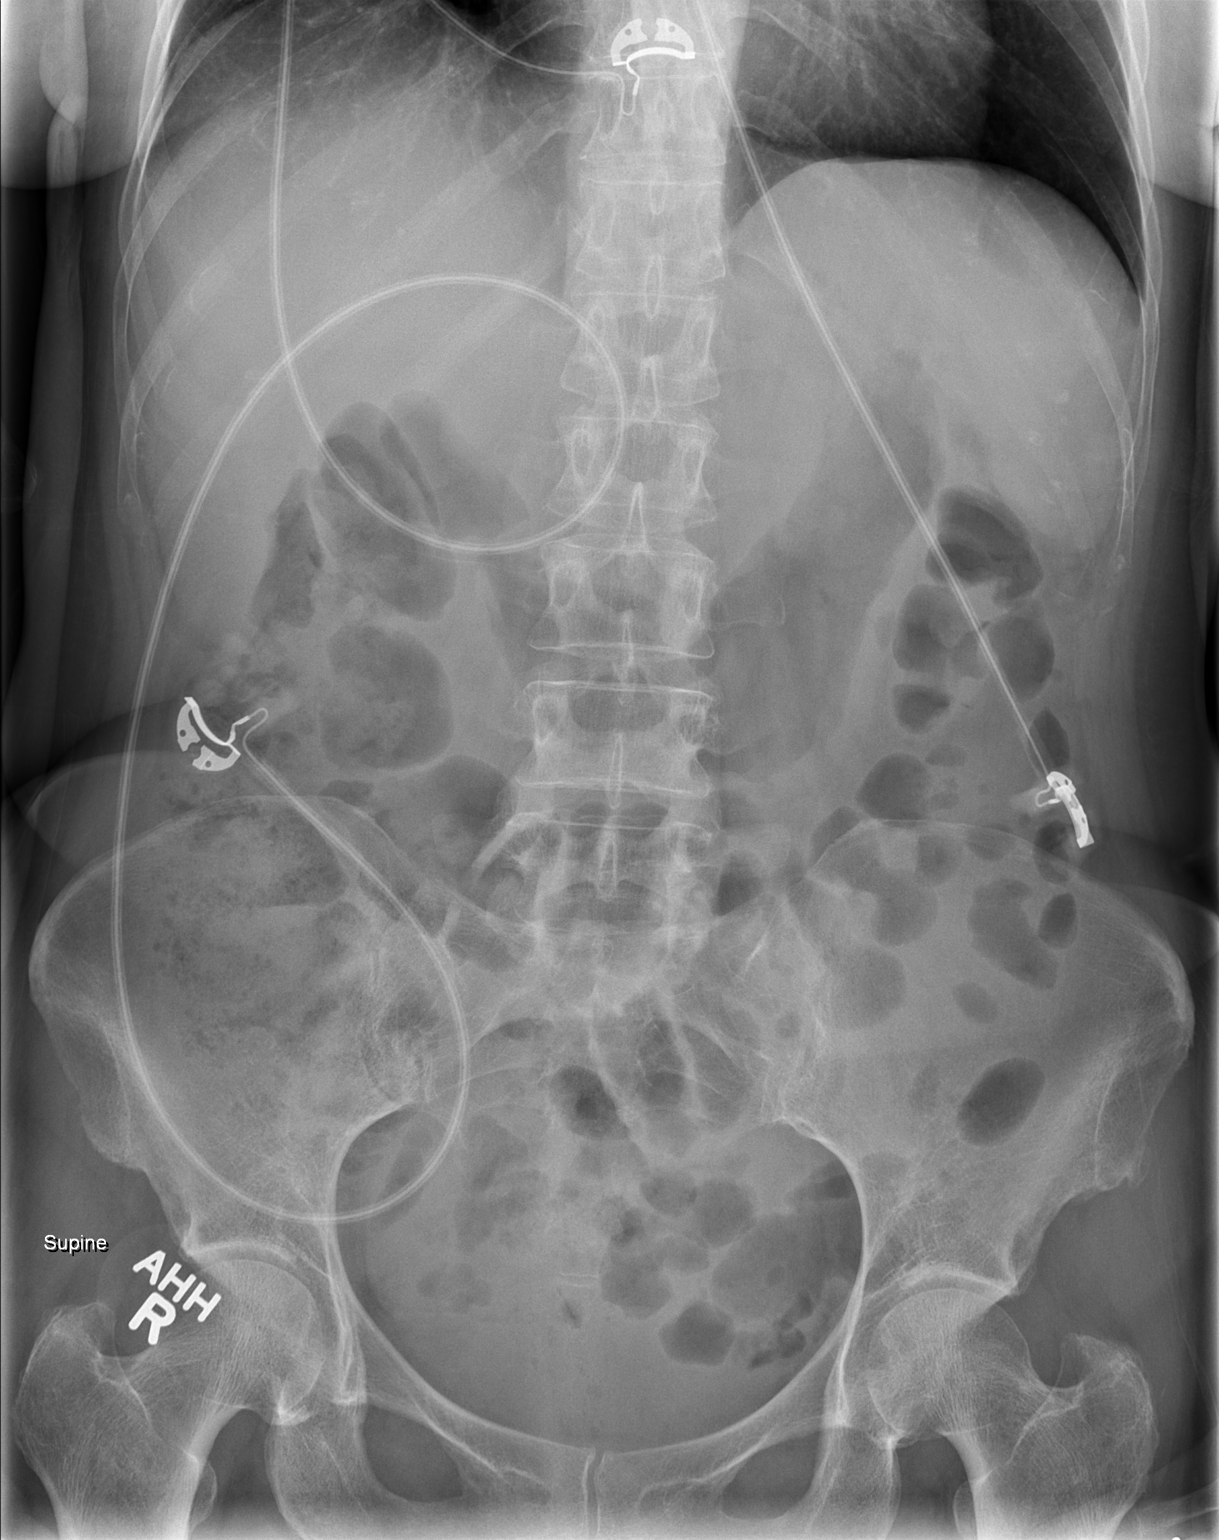

[2 of 2 positions shown; findings below may reference images not displayed]

FINDINGS: Bowel gas pattern is normal without evidence of ileus, obstruction
or free air. No abnormal calcifications or bony findings.
IMPRESSION: Negative abdominal radiographs.

## 2014-11-24 IMAGING — CT CT CHEST W/ CM
2 of 4 series · 16 of 46 positions shown, 18 images · IV contrast (OMNIPAQUE)
Comparison: CT CHEST W/CM dated 11/21/2013; CT CHEST W/CM dated
09/19/2013; CT CHEST W/CM dated 07/20/2013

CLINICAL DATA: Restaging metastatic cholangiocarcinoma. Patient
undergoing chemotherapy status post 9 cycles.

EXAM:
CT CHEST, ABDOMEN, AND PELVIS WITH CONTRAST
TECHNIQUE: Multidetector CT imaging of the chest, abdomen and pelvis was
performed following the standard protocol during bolus
administration of intravenous contrast.
CONTRAST:  100mL OMNIPAQUE IOHEXOL 300 MG/ML  SOLN

[Series 2: cap with st · axial · 0.73mm/px · z∈[+1208,+1748]mm · 13 of 118 slices shown, 15 images]
[im 5/118  soft-tissue]
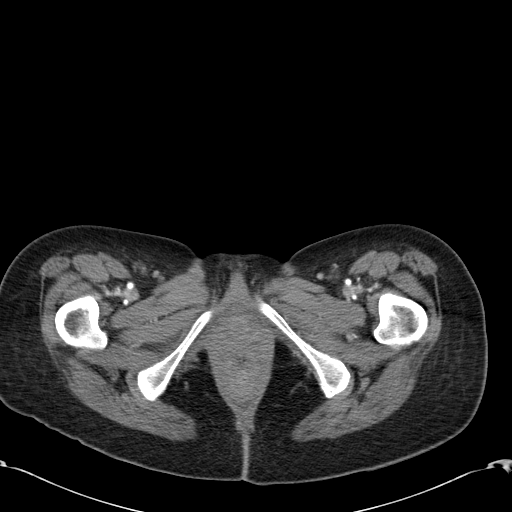
[im 5/118  bone]
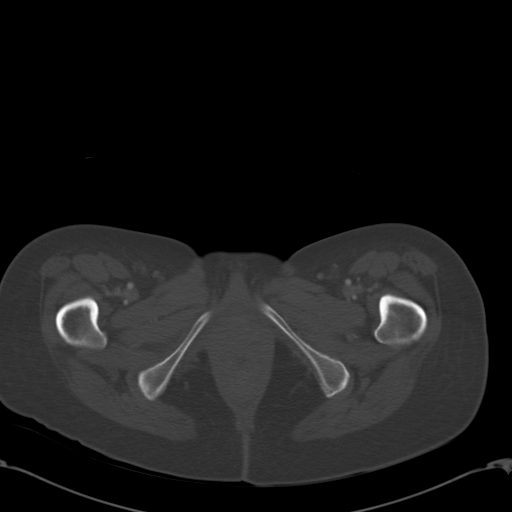
[im 15/118  soft-tissue]
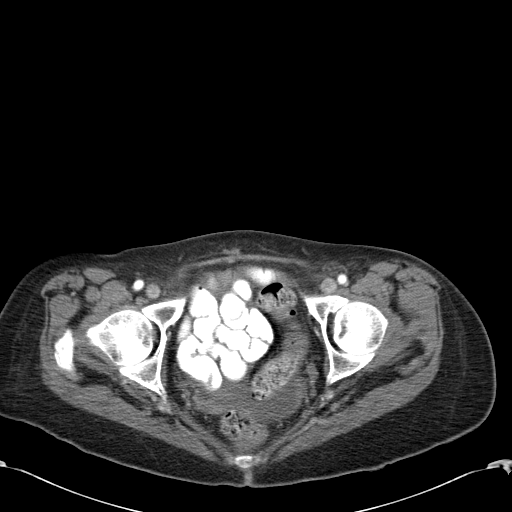
[im 25/118  soft-tissue]
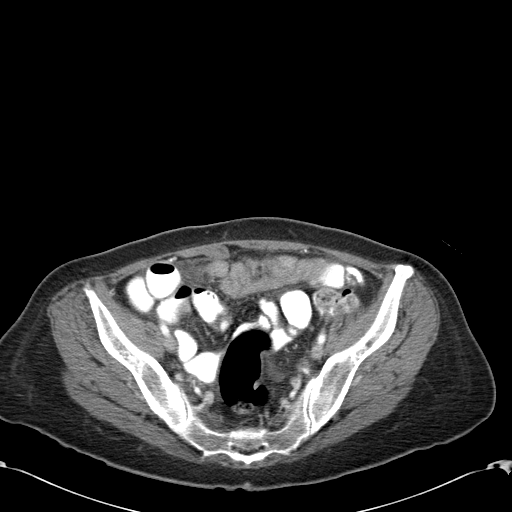
[im 35/118  soft-tissue]
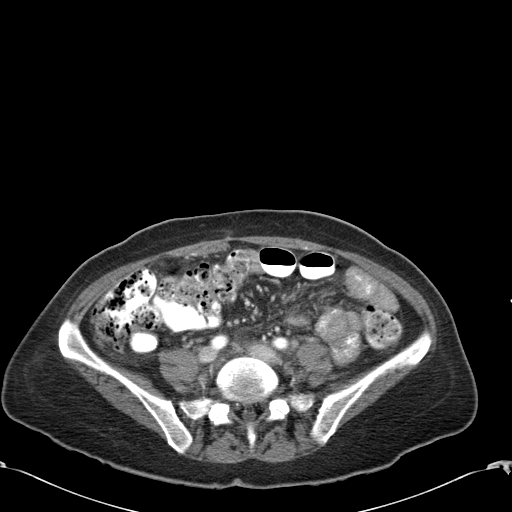
[im 40/118  soft-tissue]
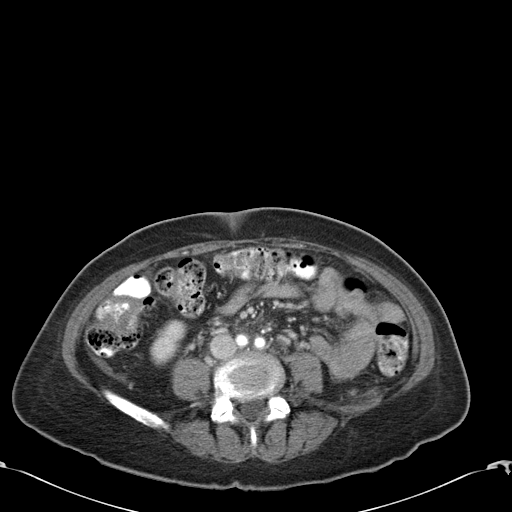
[im 49/118  soft-tissue]
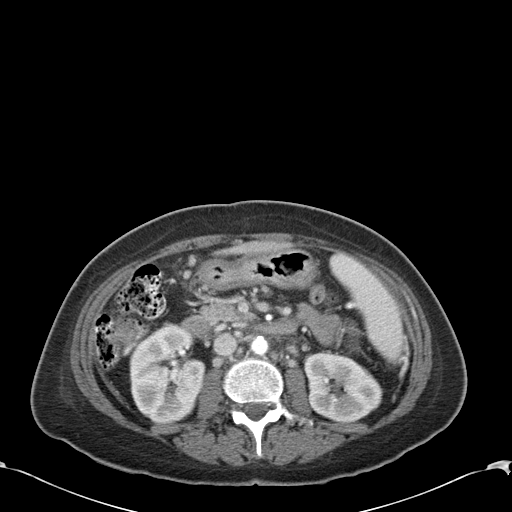
[im 59/118  soft-tissue]
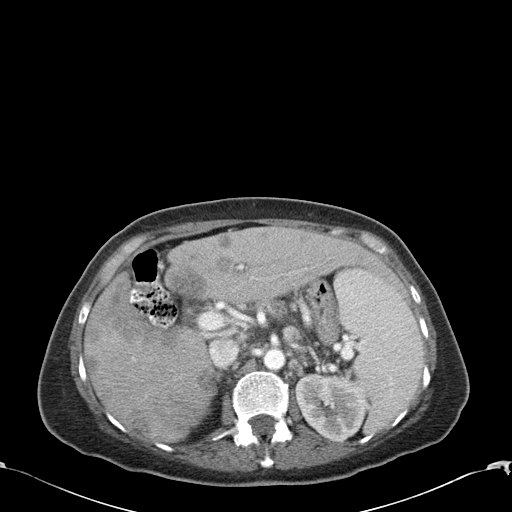
[im 69/118  soft-tissue]
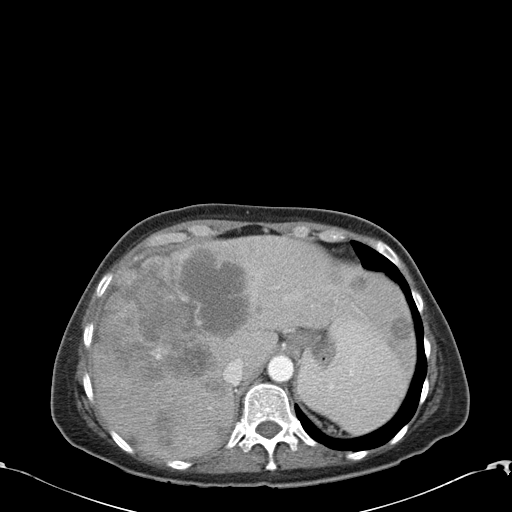
[im 79/118  soft-tissue]
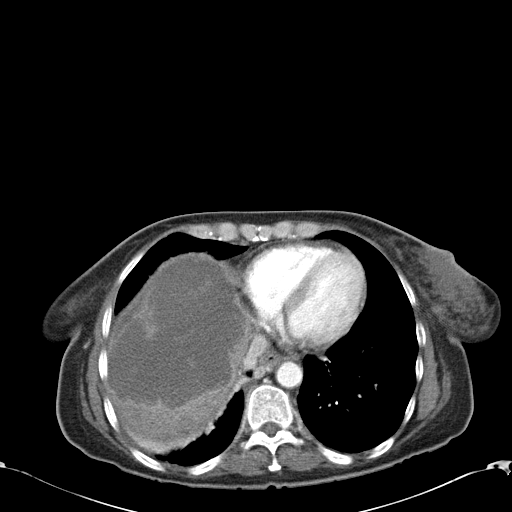
[im 79/118  bone]
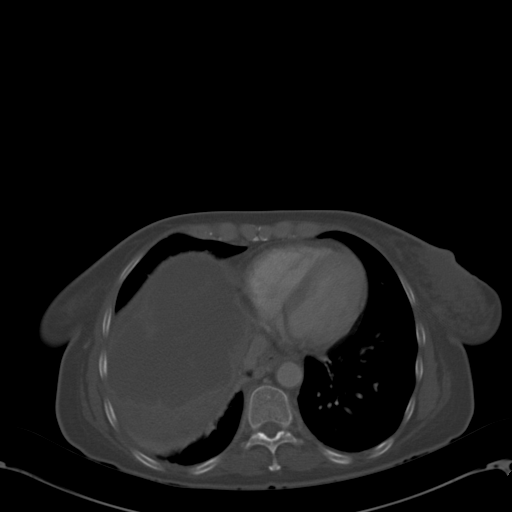
[im 83/118  soft-tissue]
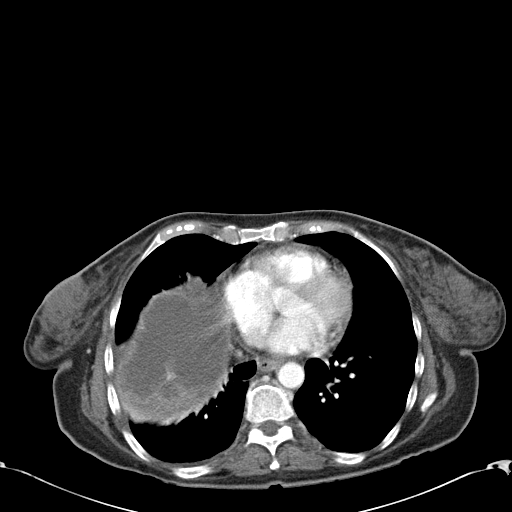
[im 93/118  soft-tissue]
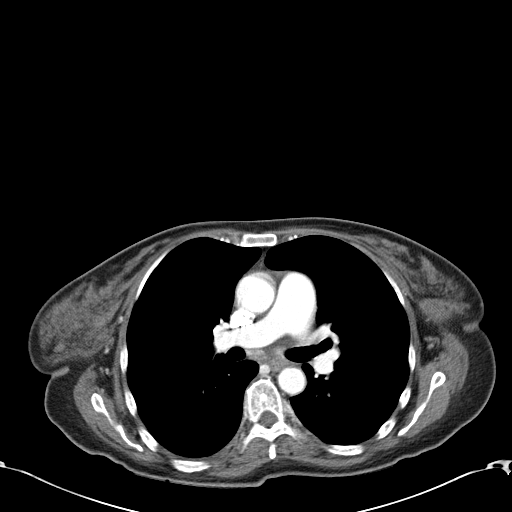
[im 103/118  soft-tissue]
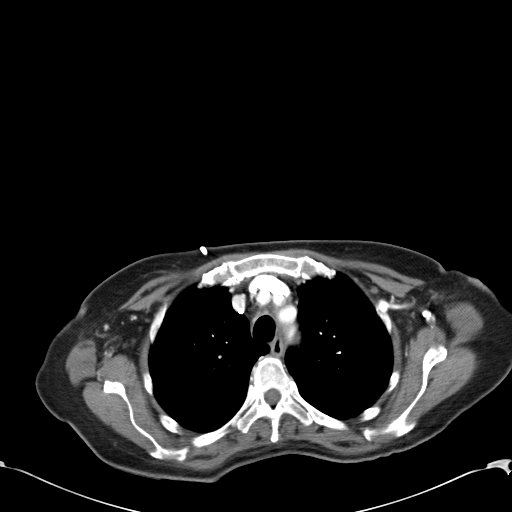
[im 113/118  soft-tissue]
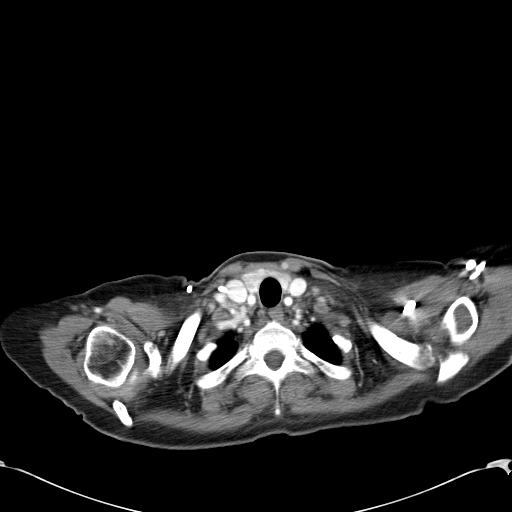

[Series 602: <mpr thick range> · coronal · 1.15mm/px · 3 of 80 slices shown]
[im 27/80  soft-tissue]
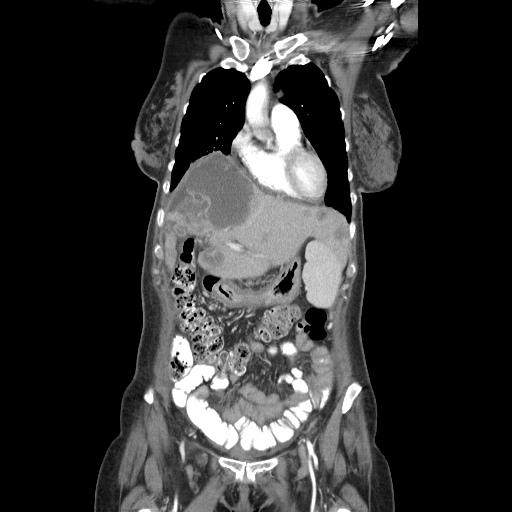
[im 36/80  soft-tissue]
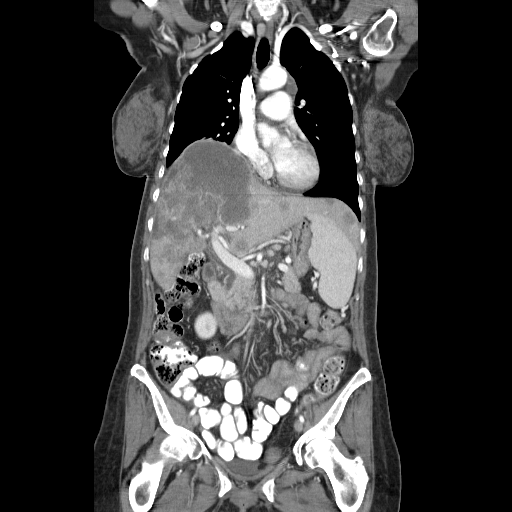
[im 44/80  soft-tissue]
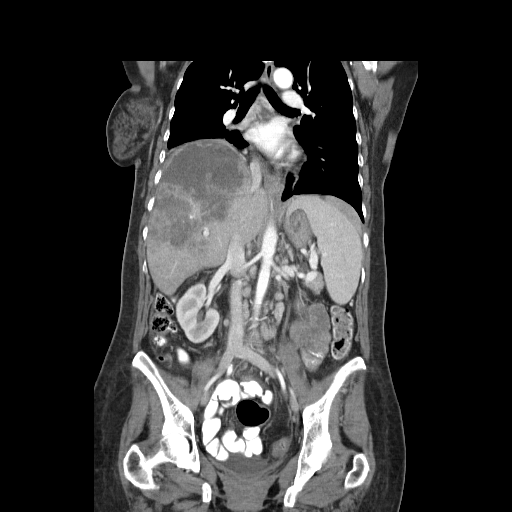

[16 of 46 positions shown; findings below may reference images not displayed]

FINDINGS: CT CHEST FINDINGS

Port in the right anterior chest wall. No axillary or
supraclavicular lymphadenopathy. No mediastinal hilar
lymphadenopathy. No pericardial fluid. There is a prevascular lymph
node measuring 39 x 18 mm which is increased in thickness from 33 by
11 mm on prior.

Review of the lung parenchyma demonstrates no suspicious pulmonary
nodules.

CT ABDOMEN AND PELVIS FINDINGS

There has been marked progression of hepatic metastatic disease. The
dominant lesion in the right hepatic lobe decrease in fluid volume
measuring 13 x 9 cm in axial dimension compared to 10 x 7.2 cm on
prior. Lesion in the left hepatic lobe measures 18 mm compared to 8
mm on prior (image 62, series 2). 15 mm lesion in the left hepatic
lobe (image 62) is barely discernible on comparison exam. There
several new lesions in the left hepatic lobe including a 13 mm
lesion (image 48).

There is no biliary ductal dilatation evident. Patient status post
cholecystectomy. Pancreas, spleen, adrenal glands, and kidneys are
normal.

There are several venous collateralsat the splenic hilum. There is a
small splenorenal shunt on the left.

The stomach, small bowel, appendix, cecum normal. The colon
rectosigmoid colon are normal.

Abdominal aorta is normal caliber. There are multiple small
periaortic lymph nodes. Some of these have irregular morphology
including a 13 mm lymph node (image 66, series 2). This node is
increased in size from 8 mm on prior. Left periaortic lymph node
measuring 11 mm (image 73 is increased from 8 mm on prior.

Small amount free fluid the pelvis. The bladder is normal. Post
hysterectomy anatomy. No pelvic lymphadenopathy. No aggressive
osseous lesion.
IMPRESSION: 1. Enlargement of prevascular lymph node is concerning for
mediastinal metastasis.
2. Unfortunately there has been significant progression of hepatic
metastasis with enlargement of previous seen lesions and several new
lesions.
3. Mild enlargement of periaortic lymph nodes consistent with nodal
metastasis.
4. Evidence of portal hypertension with splenic venous collateral
circulation.
5. Small amount of free fluid the pelvis may relate to portal
hypertension.

## 2014-12-28 IMAGING — CR DG CHEST 1V PORT
1 series · 1 of 1 positions shown · non-contrast
Comparison: CT chest dated 02/18/2014

CLINICAL DATA: Shortness of breath, metastatic cholangiocarcinoma

EXAM:
PORTABLE CHEST - 1 VIEW

[AP]
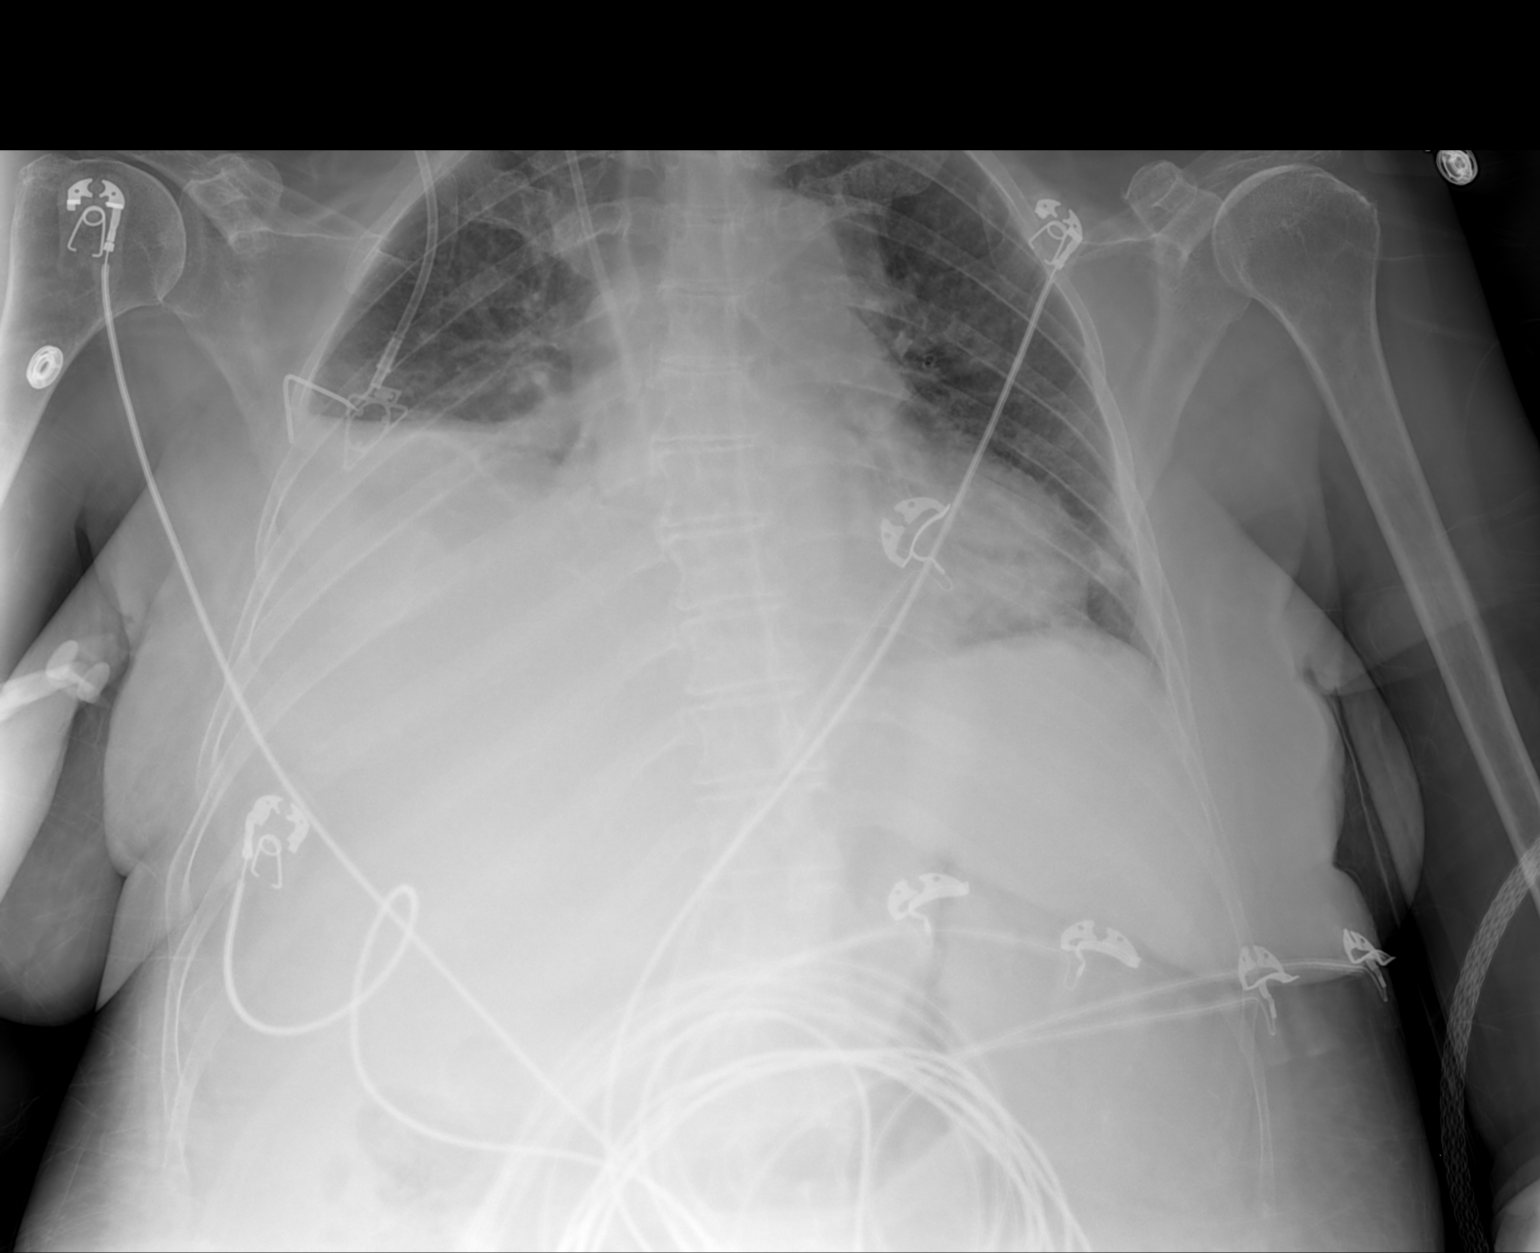

[1 of 1 positions shown; findings below may reference images not displayed]

FINDINGS: Elevation of the right hemidiaphragm. Associated right basilar
opacity, likely atelectasis. Right pleural effusion not excluded.

Left lung is essentially clear. Mild lingular/left lower lobe
opacity, possibly atelectasis. No pneumothorax.

Cardiomegaly.

Right chest power port terminates cavoatrial junction.
IMPRESSION: Possible bilateral lower lobe atelectasis. Right pleural effusion
not excluded.

Elevation of the right hemidiaphragm.

## 2014-12-28 IMAGING — US US PARACENTESIS
1 series · 5 of 5 positions shown · non-contrast
Comparison: None.

CLINICAL DATA: Abdominal ascites

EXAM:
ULTRASOUND GUIDED right lower quadrant PARACENTESIS

[Series 1: us paracentesis · 0.27mm/px · 5 of 5 slices shown]
[im 1/5]
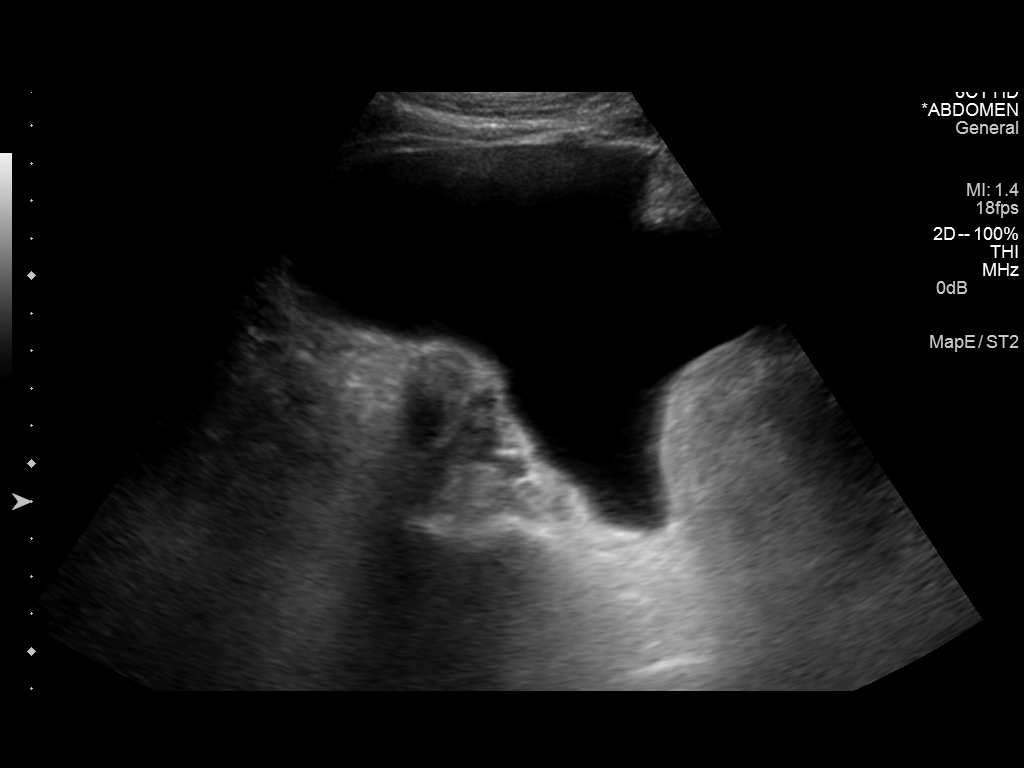
[im 2/5]
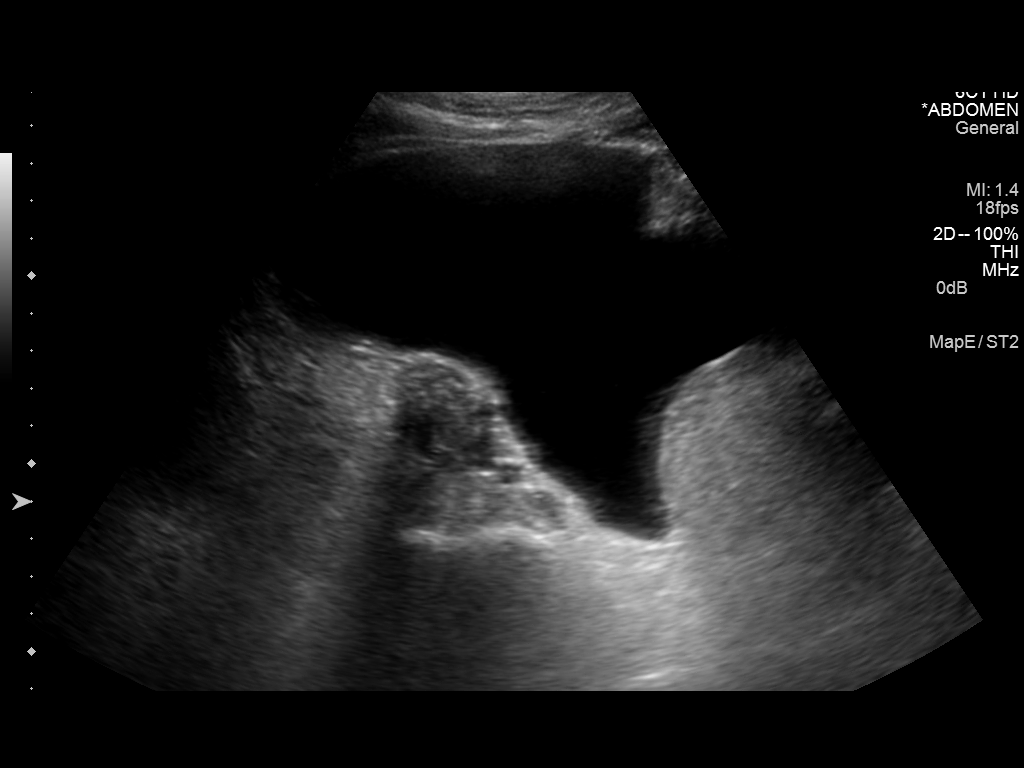
[im 3/5]
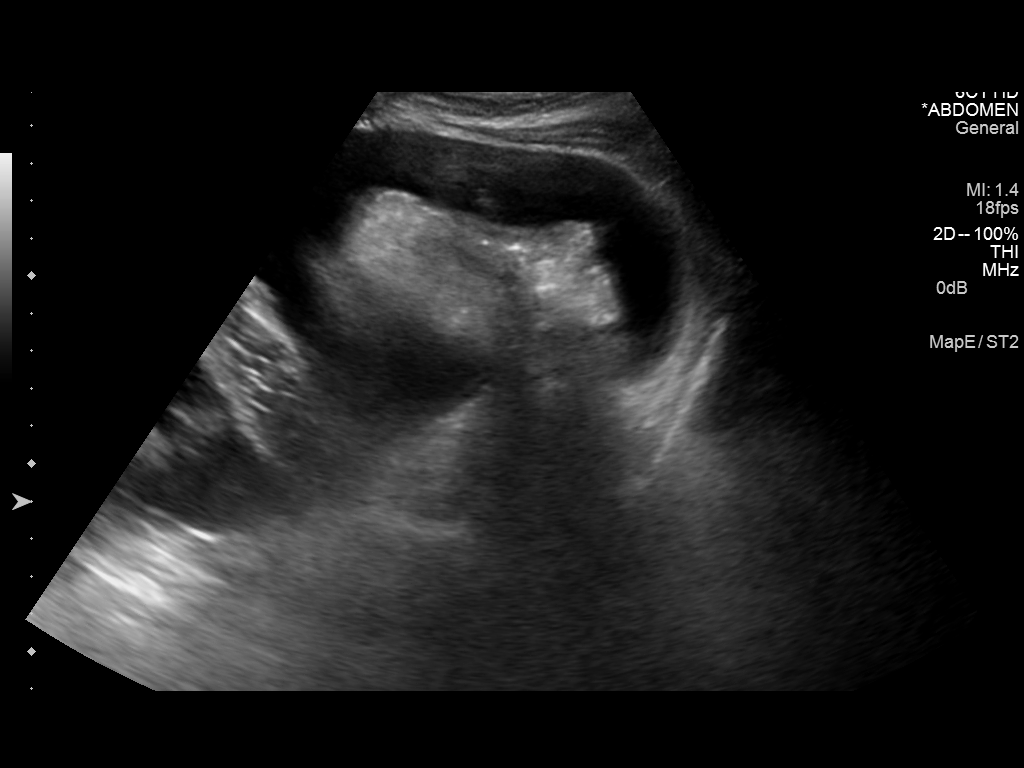
[im 4/5]
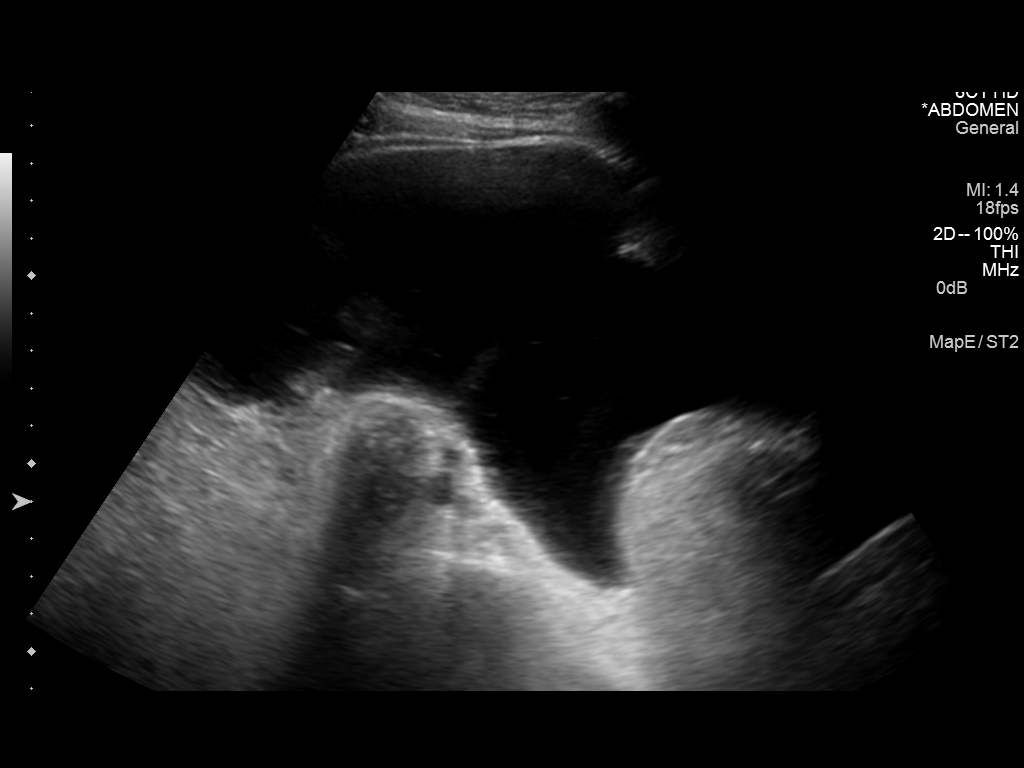
[im 5/5]
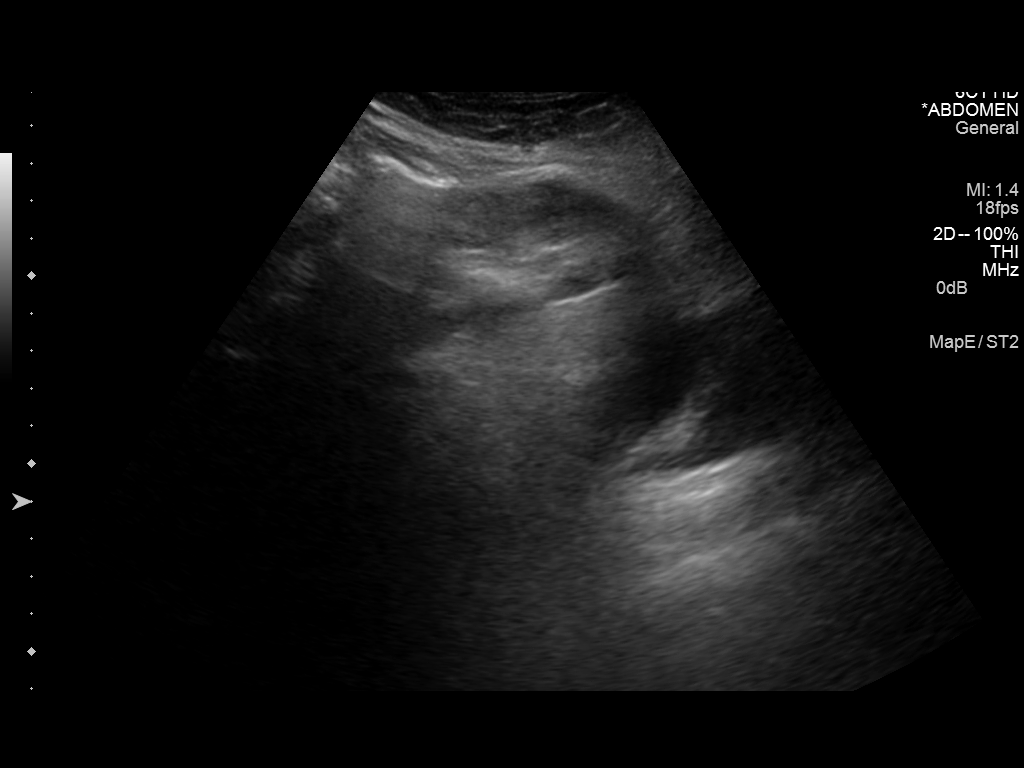

[5 of 5 positions shown; findings below may reference images not displayed]

PROCEDURE:
An ultrasound guided paracentesis was thoroughly discussed with the
patient and questions answered. The benefits, risks, alternatives
and complications were also discussed. The patient understands and
wishes to proceed with the procedure. Written consent was obtained.

Ultrasound was performed to localize and mark an adequate pocket of
fluid in the right lower quadrant of the abdomen. The area was then
prepped and draped in the normal sterile fashion. 1% Lidocaine was
used for local anesthesia. Under ultrasound guidance a 19 gauge Yueh
catheter was introduced. Paracentesis was performed. The catheter
was removed and a dressing applied.

Complications: None.
FINDINGS: A total of approximately 2 L of yellow fluid was removed. A fluid
sample was sent for laboratory analysis.
IMPRESSION: Successful ultrasound guided paracentesis yielding 2 L of ascites.

Read by: Kodza [email or web address]
